# Patient Record
Sex: Female | Born: 1950 | Race: White | Hispanic: No | Marital: Married | Smoking: Never smoker
Health system: Southern US, Community
[De-identification: ages and names within clinical notes are randomized; demographics above are authoritative.]

## PROBLEM LIST (undated history)

## (undated) DIAGNOSIS — G250 Essential tremor: Secondary | ICD-10-CM

## (undated) DIAGNOSIS — D509 Iron deficiency anemia, unspecified: Secondary | ICD-10-CM

## (undated) DIAGNOSIS — N189 Chronic kidney disease, unspecified: Secondary | ICD-10-CM

## (undated) DIAGNOSIS — G252 Other specified forms of tremor: Secondary | ICD-10-CM

## (undated) DIAGNOSIS — F419 Anxiety disorder, unspecified: Secondary | ICD-10-CM

## (undated) DIAGNOSIS — E119 Type 2 diabetes mellitus without complications: Secondary | ICD-10-CM

## (undated) DIAGNOSIS — K219 Gastro-esophageal reflux disease without esophagitis: Secondary | ICD-10-CM

## (undated) DIAGNOSIS — D510 Vitamin B12 deficiency anemia due to intrinsic factor deficiency: Secondary | ICD-10-CM

## (undated) DIAGNOSIS — F32A Depression, unspecified: Secondary | ICD-10-CM

## (undated) DIAGNOSIS — K5730 Diverticulosis of large intestine without perforation or abscess without bleeding: Secondary | ICD-10-CM

## (undated) DIAGNOSIS — F329 Major depressive disorder, single episode, unspecified: Secondary | ICD-10-CM

## (undated) DIAGNOSIS — K589 Irritable bowel syndrome without diarrhea: Secondary | ICD-10-CM

## (undated) DIAGNOSIS — E785 Hyperlipidemia, unspecified: Secondary | ICD-10-CM

## (undated) HISTORY — DX: Type 2 diabetes mellitus without complications: E11.9

## (undated) HISTORY — DX: Iron deficiency anemia, unspecified: D50.9

## (undated) HISTORY — DX: Essential tremor: G25.0

## (undated) HISTORY — DX: Major depressive disorder, single episode, unspecified: F32.9

## (undated) HISTORY — DX: Anxiety disorder, unspecified: F41.9

## (undated) HISTORY — PX: OTHER SURGICAL HISTORY: SHX169

## (undated) HISTORY — DX: Depression, unspecified: F32.A

## (undated) HISTORY — DX: Diverticulosis of large intestine without perforation or abscess without bleeding: K57.30

## (undated) HISTORY — PX: BILATERAL OOPHORECTOMY: SHX1221

## (undated) HISTORY — DX: Vitamin B12 deficiency anemia due to intrinsic factor deficiency: D51.0

## (undated) HISTORY — DX: Irritable bowel syndrome, unspecified: K58.9

## (undated) HISTORY — DX: Morbid (severe) obesity due to excess calories: E66.01

## (undated) HISTORY — DX: Gastro-esophageal reflux disease without esophagitis: K21.9

## (undated) HISTORY — PX: ABDOMINAL HYSTERECTOMY: SHX81

## (undated) HISTORY — DX: Hyperlipidemia, unspecified: E78.5

## (undated) HISTORY — PX: TONSILLECTOMY: SHX5217

## (undated) HISTORY — DX: Chronic kidney disease, unspecified: N18.9

## (undated) HISTORY — DX: Other specified forms of tremor: G25.2

---

## 1999-12-14 ENCOUNTER — Emergency Department (HOSPITAL_COMMUNITY): Admission: EM | Admit: 1999-12-14 | Discharge: 1999-12-14 | Payer: Self-pay | Admitting: Emergency Medicine

## 2003-11-22 ENCOUNTER — Ambulatory Visit: Payer: Self-pay | Admitting: Endocrinology

## 2004-04-24 ENCOUNTER — Ambulatory Visit: Payer: Self-pay | Admitting: Endocrinology

## 2004-12-05 ENCOUNTER — Ambulatory Visit: Payer: Self-pay | Admitting: Endocrinology

## 2004-12-11 ENCOUNTER — Ambulatory Visit: Payer: Self-pay | Admitting: Endocrinology

## 2005-02-07 ENCOUNTER — Ambulatory Visit: Payer: Self-pay | Admitting: Endocrinology

## 2005-12-28 ENCOUNTER — Ambulatory Visit: Payer: Self-pay | Admitting: Family Medicine

## 2005-12-30 ENCOUNTER — Ambulatory Visit: Payer: Self-pay | Admitting: Internal Medicine

## 2005-12-30 ENCOUNTER — Ambulatory Visit: Payer: Self-pay | Admitting: Gastroenterology

## 2006-01-31 ENCOUNTER — Ambulatory Visit: Payer: Self-pay | Admitting: Endocrinology

## 2006-01-31 LAB — CONVERTED CEMR LAB
ALT: 18 units/L (ref 0–40)
AST: 17 units/L (ref 0–37)
Albumin: 3 g/dL — ABNORMAL LOW (ref 3.5–5.2)
BUN: 13 mg/dL (ref 6–23)
Basophils Relative: 0.5 % (ref 0.0–1.0)
Bilirubin Urine: NEGATIVE
Chloride: 103 meq/L (ref 96–112)
Eosinophil percent: 1.5 % (ref 0.0–5.0)
GFR calc non Af Amer: 62 mL/min
HCT: 34.9 % — ABNORMAL LOW (ref 36.0–46.0)
HDL: 42.4 mg/dL (ref >39.0)
Hemoglobin: 11.4 g/dL — ABNORMAL LOW (ref 12.0–15.0)
LDL DIRECT: 49.1 mg/dL
Leukocytes, UA: NEGATIVE
Neutrophils Relative %: 53.2 % (ref 43.0–77.0)
RBC: 4.03 M/uL (ref 3.87–5.11)
RDW: 15 % — ABNORMAL HIGH (ref 11.5–14.6)
Sodium: 143 meq/L (ref 135–145)
Specific Gravity, Urine: >=1.03 (ref 1.000–1.03)
Total Bilirubin: 0.5 mg/dL (ref 0.3–1.2)
Triglyceride fasting, serum: 207 mg/dL (ref 0–149)
Urine Glucose: NEGATIVE mg/dL
WBC: 10.4 10*3/uL (ref 4.5–10.5)

## 2006-02-10 ENCOUNTER — Ambulatory Visit: Payer: Self-pay | Admitting: Endocrinology

## 2006-02-10 LAB — CONVERTED CEMR LAB
HCT: 36.5 % (ref 36.0–46.0)
Hemoglobin: 12.9 g/dL (ref 12.0–15.0)
Hgb A1c MFr Bld: 6.9 % — ABNORMAL HIGH (ref 4.6–6.0)
Saturation Ratios: 14.2 % — ABNORMAL LOW (ref 20.0–50.0)
Transferrin: 310.9 mg/dL (ref >=212.0)
Vitamin B-12: 111 pg/mL — ABNORMAL LOW (ref 211–911)
WBC: 12.9 10*3/uL — ABNORMAL HIGH (ref 4.5–10.5)

## 2006-02-14 ENCOUNTER — Ambulatory Visit: Payer: Self-pay | Admitting: Endocrinology

## 2006-03-17 ENCOUNTER — Ambulatory Visit: Payer: Self-pay | Admitting: Endocrinology

## 2006-04-15 ENCOUNTER — Ambulatory Visit: Payer: Self-pay | Admitting: Endocrinology

## 2006-05-16 LAB — HM COLONOSCOPY

## 2006-05-21 ENCOUNTER — Ambulatory Visit: Payer: Self-pay | Admitting: Endocrinology

## 2006-06-02 ENCOUNTER — Ambulatory Visit: Payer: Self-pay | Admitting: Endocrinology

## 2006-07-07 ENCOUNTER — Ambulatory Visit: Payer: Self-pay | Admitting: Endocrinology

## 2006-08-04 ENCOUNTER — Ambulatory Visit: Payer: Self-pay | Admitting: Endocrinology

## 2006-08-15 ENCOUNTER — Encounter: Payer: Self-pay | Admitting: Endocrinology

## 2006-09-05 ENCOUNTER — Ambulatory Visit: Payer: Self-pay | Admitting: Endocrinology

## 2006-10-14 ENCOUNTER — Ambulatory Visit: Payer: Self-pay | Admitting: Endocrinology

## 2006-11-13 ENCOUNTER — Ambulatory Visit: Payer: Self-pay | Admitting: Endocrinology

## 2006-11-14 ENCOUNTER — Ambulatory Visit: Payer: Self-pay | Admitting: Endocrinology

## 2006-12-22 ENCOUNTER — Ambulatory Visit: Payer: Self-pay | Admitting: Endocrinology

## 2006-12-22 DIAGNOSIS — D510 Vitamin B12 deficiency anemia due to intrinsic factor deficiency: Secondary | ICD-10-CM | POA: Insufficient documentation

## 2007-02-02 ENCOUNTER — Ambulatory Visit: Payer: Self-pay | Admitting: Endocrinology

## 2007-02-02 ENCOUNTER — Telehealth (INDEPENDENT_AMBULATORY_CARE_PROVIDER_SITE_OTHER): Payer: Self-pay | Admitting: *Deleted

## 2007-02-17 ENCOUNTER — Ambulatory Visit: Payer: Self-pay | Admitting: Internal Medicine

## 2007-02-17 DIAGNOSIS — K5730 Diverticulosis of large intestine without perforation or abscess without bleeding: Secondary | ICD-10-CM | POA: Insufficient documentation

## 2007-02-17 DIAGNOSIS — E1165 Type 2 diabetes mellitus with hyperglycemia: Secondary | ICD-10-CM

## 2007-02-17 DIAGNOSIS — D509 Iron deficiency anemia, unspecified: Secondary | ICD-10-CM

## 2007-02-17 DIAGNOSIS — F32A Depression, unspecified: Secondary | ICD-10-CM | POA: Insufficient documentation

## 2007-02-17 DIAGNOSIS — K589 Irritable bowel syndrome without diarrhea: Secondary | ICD-10-CM

## 2007-02-17 DIAGNOSIS — K219 Gastro-esophageal reflux disease without esophagitis: Secondary | ICD-10-CM

## 2007-02-17 DIAGNOSIS — J309 Allergic rhinitis, unspecified: Secondary | ICD-10-CM | POA: Insufficient documentation

## 2007-02-17 DIAGNOSIS — E785 Hyperlipidemia, unspecified: Secondary | ICD-10-CM

## 2007-02-17 DIAGNOSIS — F411 Generalized anxiety disorder: Secondary | ICD-10-CM

## 2007-02-17 DIAGNOSIS — F329 Major depressive disorder, single episode, unspecified: Secondary | ICD-10-CM

## 2007-02-23 ENCOUNTER — Encounter: Payer: Self-pay | Admitting: Endocrinology

## 2007-03-06 ENCOUNTER — Telehealth (INDEPENDENT_AMBULATORY_CARE_PROVIDER_SITE_OTHER): Payer: Self-pay | Admitting: *Deleted

## 2007-03-09 ENCOUNTER — Ambulatory Visit: Payer: Self-pay | Admitting: Endocrinology

## 2007-03-27 ENCOUNTER — Telehealth (INDEPENDENT_AMBULATORY_CARE_PROVIDER_SITE_OTHER): Payer: Self-pay | Admitting: *Deleted

## 2007-04-10 ENCOUNTER — Ambulatory Visit: Payer: Self-pay | Admitting: Endocrinology

## 2007-04-10 DIAGNOSIS — M545 Low back pain: Secondary | ICD-10-CM

## 2007-04-10 LAB — CONVERTED CEMR LAB
Bilirubin Urine: NEGATIVE
Protein, U semiquant: >=300
Specific Gravity, Urine: 1.015
pH: 5

## 2007-04-13 ENCOUNTER — Ambulatory Visit: Payer: Self-pay | Admitting: Endocrinology

## 2007-04-14 ENCOUNTER — Telehealth: Payer: Self-pay | Admitting: Family Medicine

## 2007-04-14 LAB — CONVERTED CEMR LAB
AST: 13 units/L (ref 0–37)
Bilirubin, Direct: 0.1 mg/dL (ref 0.0–0.3)
CO2: 30 meq/L (ref 19–32)
Chloride: 106 meq/L (ref 96–112)
Creatinine, Ser: 1 mg/dL (ref 0.4–1.2)
Eosinophils Relative: 0.9 % (ref 0.0–5.0)
Glucose, Bld: 125 mg/dL — ABNORMAL HIGH (ref 70–99)
HCT: 33.6 % — ABNORMAL LOW (ref 36.0–46.0)
Hgb A1c MFr Bld: 6.4 % — ABNORMAL HIGH (ref 4.6–6.0)
MCV: 85.3 fL (ref 78.0–100.0)
Neutrophils Relative %: 58.1 % (ref 43.0–77.0)
Nitrite: NEGATIVE
RBC: 3.93 M/uL (ref 3.87–5.11)
RDW: 15.5 % — ABNORMAL HIGH (ref 11.5–14.6)
Sodium: 143 meq/L (ref 135–145)
Total Bilirubin: 0.3 mg/dL (ref 0.3–1.2)
Total CHOL/HDL Ratio: 3.4
Urobilinogen, UA: 0.2 (ref 0.0–1.0)
WBC: 10.5 10*3/uL (ref 4.5–10.5)
pH: 6 (ref 5.0–8.0)

## 2007-04-17 ENCOUNTER — Ambulatory Visit: Payer: Self-pay | Admitting: Endocrinology

## 2007-04-17 DIAGNOSIS — Z780 Asymptomatic menopausal state: Secondary | ICD-10-CM | POA: Insufficient documentation

## 2007-04-29 ENCOUNTER — Encounter: Payer: Self-pay | Admitting: Endocrinology

## 2007-04-29 ENCOUNTER — Ambulatory Visit: Payer: Self-pay | Admitting: Internal Medicine

## 2007-05-13 ENCOUNTER — Ambulatory Visit: Payer: Self-pay | Admitting: Endocrinology

## 2007-05-21 ENCOUNTER — Ambulatory Visit: Payer: Self-pay | Admitting: Endocrinology

## 2007-05-21 DIAGNOSIS — I10 Essential (primary) hypertension: Secondary | ICD-10-CM | POA: Insufficient documentation

## 2007-05-21 DIAGNOSIS — M810 Age-related osteoporosis without current pathological fracture: Secondary | ICD-10-CM | POA: Insufficient documentation

## 2007-05-21 LAB — CONVERTED CEMR LAB
Basophils Absolute: 0 10*3/uL
Basophils Relative: 0 %
Eosinophils Absolute: 0.1 10*3/uL
Eosinophils Relative: 0.9 %
HCT: 34.2 % — ABNORMAL LOW
Hemoglobin: 11.2 g/dL — ABNORMAL LOW
Iron: 59 ug/dL
Lymphocytes Relative: 29.8 %
MCHC: 32.6 g/dL
MCV: 85 fL
Monocytes Absolute: 0.4 10*3/uL
Monocytes Relative: 3.9 %
Neutro Abs: 6.4 10*3/uL
Neutrophils Relative %: 65.4 %
Platelets: 399 10*3/uL
RBC: 4.03 M/uL
RDW: 15.1 % — ABNORMAL HIGH
Saturation Ratios: 13.8 % — ABNORMAL LOW
Transferrin: 305.2 mg/dL
WBC: 9.9 10*3/uL

## 2007-05-22 ENCOUNTER — Telehealth (INDEPENDENT_AMBULATORY_CARE_PROVIDER_SITE_OTHER): Payer: Self-pay | Admitting: *Deleted

## 2007-05-22 ENCOUNTER — Encounter: Payer: Self-pay | Admitting: Endocrinology

## 2007-05-26 ENCOUNTER — Telehealth (INDEPENDENT_AMBULATORY_CARE_PROVIDER_SITE_OTHER): Payer: Self-pay | Admitting: *Deleted

## 2007-05-26 LAB — CONVERTED CEMR LAB
Alpha-1-Globulin: 5.6 % — ABNORMAL HIGH (ref 2.9–4.9)
Alpha-2-Globulin: 15.7 % — ABNORMAL HIGH (ref 7.1–11.8)
Beta Globulin: 7.1 % (ref 4.7–7.2)
Calcium, Total (PTH): 9 mg/dL (ref 8.4–10.5)
Gamma Globulin: 11.3 % (ref 11.1–18.8)

## 2007-06-23 ENCOUNTER — Ambulatory Visit: Payer: Self-pay | Admitting: Endocrinology

## 2007-07-27 ENCOUNTER — Ambulatory Visit: Payer: Self-pay | Admitting: Endocrinology

## 2007-08-26 ENCOUNTER — Ambulatory Visit: Payer: Self-pay | Admitting: Endocrinology

## 2007-10-06 ENCOUNTER — Ambulatory Visit: Payer: Self-pay | Admitting: Endocrinology

## 2007-10-09 ENCOUNTER — Ambulatory Visit: Payer: Self-pay | Admitting: Endocrinology

## 2007-11-04 ENCOUNTER — Ambulatory Visit: Payer: Self-pay | Admitting: Endocrinology

## 2007-12-08 ENCOUNTER — Ambulatory Visit: Payer: Self-pay | Admitting: Endocrinology

## 2007-12-14 ENCOUNTER — Ambulatory Visit: Payer: Self-pay | Admitting: Internal Medicine

## 2007-12-14 DIAGNOSIS — R319 Hematuria, unspecified: Secondary | ICD-10-CM

## 2007-12-14 DIAGNOSIS — N390 Urinary tract infection, site not specified: Secondary | ICD-10-CM

## 2007-12-14 LAB — CONVERTED CEMR LAB
Glucose, Urine, Semiquant: NEGATIVE
Nitrite: NEGATIVE
Specific Gravity, Urine: 1.01
pH: 5

## 2008-01-07 ENCOUNTER — Ambulatory Visit: Payer: Self-pay | Admitting: Endocrinology

## 2008-02-19 ENCOUNTER — Ambulatory Visit: Payer: Self-pay | Admitting: Endocrinology

## 2008-04-05 ENCOUNTER — Ambulatory Visit: Payer: Self-pay | Admitting: Endocrinology

## 2008-04-26 ENCOUNTER — Encounter: Payer: Self-pay | Admitting: Endocrinology

## 2008-04-27 ENCOUNTER — Ambulatory Visit: Payer: Self-pay | Admitting: Endocrinology

## 2008-04-27 LAB — CONVERTED CEMR LAB
BUN: 28 mg/dL — ABNORMAL HIGH (ref 6–23)
Calcium: 9.8 mg/dL (ref 8.4–10.5)
Creatinine, Ser: 2.5 mg/dL — ABNORMAL HIGH (ref 0.4–1.2)
Direct LDL: 68.3 mg/dL
Hgb A1c MFr Bld: 6.3 % (ref 4.6–6.5)
TSH: 4.43 microintl units/mL (ref 0.35–5.50)

## 2008-05-09 ENCOUNTER — Ambulatory Visit: Payer: Self-pay | Admitting: Endocrinology

## 2008-05-09 DIAGNOSIS — N259 Disorder resulting from impaired renal tubular function, unspecified: Secondary | ICD-10-CM | POA: Insufficient documentation

## 2008-05-10 ENCOUNTER — Telehealth (INDEPENDENT_AMBULATORY_CARE_PROVIDER_SITE_OTHER): Payer: Self-pay | Admitting: *Deleted

## 2008-05-17 ENCOUNTER — Telehealth (INDEPENDENT_AMBULATORY_CARE_PROVIDER_SITE_OTHER): Payer: Self-pay | Admitting: *Deleted

## 2008-05-24 ENCOUNTER — Ambulatory Visit: Payer: Self-pay | Admitting: Endocrinology

## 2008-05-24 LAB — CONVERTED CEMR LAB
BUN: 19 mg/dL (ref 6–23)
Basophils Relative: 0.3 % (ref 0.0–3.0)
CO2: 28 meq/L (ref 19–32)
Chloride: 106 meq/L (ref 96–112)
Creatinine, Ser: 1.9 mg/dL — ABNORMAL HIGH (ref 0.4–1.2)
Eosinophils Absolute: 0.1 10*3/uL (ref 0.0–0.7)
Eosinophils Relative: 0.6 % (ref 0.0–5.0)
HCT: 27.1 % — ABNORMAL LOW (ref 36.0–46.0)
Lymphs Abs: 2.8 10*3/uL (ref 0.7–4.0)
MCHC: 33.3 g/dL (ref 30.0–36.0)
MCV: 87.2 fL (ref 78.0–100.0)
Monocytes Absolute: 0.9 10*3/uL (ref 0.1–1.0)
Neutrophils Relative %: 64 % (ref 43.0–77.0)
Potassium: 3.8 meq/L (ref 3.5–5.1)
RBC: 3.11 M/uL — ABNORMAL LOW (ref 3.87–5.11)
WBC: 10.6 10*3/uL — ABNORMAL HIGH (ref 4.5–10.5)

## 2008-05-25 ENCOUNTER — Ambulatory Visit (HOSPITAL_COMMUNITY): Admission: RE | Admit: 2008-05-25 | Discharge: 2008-05-25 | Payer: Self-pay | Admitting: Endocrinology

## 2008-05-26 ENCOUNTER — Ambulatory Visit: Payer: Self-pay | Admitting: Cardiovascular Disease

## 2008-05-26 ENCOUNTER — Telehealth: Payer: Self-pay | Admitting: Endocrinology

## 2008-05-26 ENCOUNTER — Ambulatory Visit: Payer: Self-pay | Admitting: Internal Medicine

## 2008-05-26 ENCOUNTER — Inpatient Hospital Stay (HOSPITAL_COMMUNITY): Admission: EM | Admit: 2008-05-26 | Discharge: 2008-06-01 | Payer: Self-pay | Admitting: Emergency Medicine

## 2008-05-27 ENCOUNTER — Encounter (INDEPENDENT_AMBULATORY_CARE_PROVIDER_SITE_OTHER): Payer: Self-pay | Admitting: Internal Medicine

## 2008-06-01 ENCOUNTER — Telehealth: Payer: Self-pay | Admitting: Internal Medicine

## 2008-06-16 ENCOUNTER — Ambulatory Visit: Payer: Self-pay | Admitting: Internal Medicine

## 2008-06-16 LAB — CONVERTED CEMR LAB
BUN: 28 mg/dL — ABNORMAL HIGH (ref 6–23)
CO2: 28 meq/L (ref 19–32)
Calcium: 9.4 mg/dL (ref 8.4–10.5)
Creatinine, Ser: 2 mg/dL — ABNORMAL HIGH (ref 0.4–1.2)
GFR calc non Af Amer: 27.41 mL/min (ref >60)
Glucose, Bld: 157 mg/dL — ABNORMAL HIGH (ref 70–99)

## 2008-06-21 ENCOUNTER — Telehealth: Payer: Self-pay | Admitting: Internal Medicine

## 2008-06-23 ENCOUNTER — Ambulatory Visit: Payer: Self-pay | Admitting: Internal Medicine

## 2008-07-21 ENCOUNTER — Encounter: Payer: Self-pay | Admitting: Internal Medicine

## 2008-07-26 ENCOUNTER — Ambulatory Visit: Payer: Self-pay | Admitting: Internal Medicine

## 2008-08-16 ENCOUNTER — Encounter: Payer: Self-pay | Admitting: Internal Medicine

## 2008-08-24 ENCOUNTER — Encounter (HOSPITAL_COMMUNITY): Admission: RE | Admit: 2008-08-24 | Discharge: 2008-11-02 | Payer: Self-pay | Admitting: Nephrology

## 2008-08-26 ENCOUNTER — Ambulatory Visit: Payer: Self-pay | Admitting: Internal Medicine

## 2008-09-22 ENCOUNTER — Ambulatory Visit: Payer: Self-pay | Admitting: Internal Medicine

## 2008-09-22 LAB — CONVERTED CEMR LAB
ALT: 16 units/L (ref 0–35)
Alkaline Phosphatase: 92 units/L (ref 39–117)
BUN: 31 mg/dL — ABNORMAL HIGH (ref 6–23)
Bilirubin, Direct: <0.1 mg/dL (ref 0.0–0.3)
Calcium: 9.6 mg/dL (ref 8.4–10.5)
Cholesterol: 167 mg/dL (ref 0–200)
Creatinine, Ser: 1.9 mg/dL — ABNORMAL HIGH (ref 0.40–1.20)
Glucose, Bld: 177 mg/dL — ABNORMAL HIGH (ref 70–99)
Hgb A1c MFr Bld: 7.1 % — ABNORMAL HIGH (ref 4.6–6.1)
Potassium: 4.7 meq/L (ref 3.5–5.3)
Triglycerides: 260 mg/dL — ABNORMAL HIGH (ref <150)
VLDL: 52 mg/dL — ABNORMAL HIGH (ref 0–40)

## 2008-09-29 ENCOUNTER — Ambulatory Visit: Payer: Self-pay | Admitting: Internal Medicine

## 2008-09-30 ENCOUNTER — Telehealth: Payer: Self-pay | Admitting: Internal Medicine

## 2008-10-26 ENCOUNTER — Ambulatory Visit: Payer: Self-pay | Admitting: Internal Medicine

## 2008-11-21 ENCOUNTER — Telehealth (INDEPENDENT_AMBULATORY_CARE_PROVIDER_SITE_OTHER): Payer: Self-pay | Admitting: *Deleted

## 2008-11-25 ENCOUNTER — Ambulatory Visit: Payer: Self-pay | Admitting: Internal Medicine

## 2008-11-29 ENCOUNTER — Encounter: Payer: Self-pay | Admitting: Internal Medicine

## 2008-12-12 ENCOUNTER — Encounter (HOSPITAL_COMMUNITY): Admission: RE | Admit: 2008-12-12 | Discharge: 2009-03-12 | Payer: Self-pay | Admitting: Nephrology

## 2008-12-20 ENCOUNTER — Telehealth: Payer: Self-pay | Admitting: Internal Medicine

## 2008-12-22 ENCOUNTER — Ambulatory Visit: Payer: Self-pay | Admitting: Internal Medicine

## 2008-12-22 LAB — CONVERTED CEMR LAB
BUN: 32 mg/dL — ABNORMAL HIGH (ref 6–23)
CO2: 20 meq/L (ref 19–32)
Chloride: 103 meq/L (ref 96–112)
Potassium: 4.1 meq/L (ref 3.5–5.3)

## 2008-12-29 ENCOUNTER — Ambulatory Visit: Payer: Self-pay | Admitting: Internal Medicine

## 2008-12-30 ENCOUNTER — Telehealth: Payer: Self-pay | Admitting: Internal Medicine

## 2009-01-04 ENCOUNTER — Encounter: Payer: Self-pay | Admitting: Internal Medicine

## 2009-01-19 ENCOUNTER — Ambulatory Visit: Payer: Self-pay | Admitting: Internal Medicine

## 2009-01-27 ENCOUNTER — Telehealth: Payer: Self-pay | Admitting: Internal Medicine

## 2009-01-27 ENCOUNTER — Telehealth (INDEPENDENT_AMBULATORY_CARE_PROVIDER_SITE_OTHER): Payer: Self-pay | Admitting: *Deleted

## 2009-02-02 ENCOUNTER — Telehealth: Payer: Self-pay | Admitting: Internal Medicine

## 2009-02-06 ENCOUNTER — Telehealth: Payer: Self-pay | Admitting: Internal Medicine

## 2009-02-23 ENCOUNTER — Ambulatory Visit: Payer: Self-pay | Admitting: Internal Medicine

## 2009-02-23 DIAGNOSIS — R5381 Other malaise: Secondary | ICD-10-CM

## 2009-02-23 DIAGNOSIS — R5383 Other fatigue: Secondary | ICD-10-CM

## 2009-02-23 LAB — CONVERTED CEMR LAB
Chloride: 101 meq/L (ref 96–112)
Ferritin: 75 ng/mL (ref 10–291)
HCT: 37.3 % (ref 36.0–46.0)
Iron: 77 ug/dL (ref 42–145)
Lymphocytes Relative: 29 % (ref 12–46)
Lymphs Abs: 2.7 10*3/uL (ref 0.7–4.0)
Neutrophils Relative %: 64 % (ref 43–77)
Platelets: 356 10*3/uL (ref 150–400)
Potassium: 4.2 meq/L (ref 3.5–5.3)
RBC: 4.21 M/uL (ref 3.87–5.11)
Saturation Ratios: 23 % (ref 20–55)
UIBC: 258 ug/dL
WBC: 9.1 10*3/uL (ref 4.0–10.5)

## 2009-02-24 ENCOUNTER — Encounter: Payer: Self-pay | Admitting: Internal Medicine

## 2009-02-24 ENCOUNTER — Telehealth: Payer: Self-pay | Admitting: Internal Medicine

## 2009-03-14 ENCOUNTER — Telehealth: Payer: Self-pay | Admitting: Internal Medicine

## 2009-03-16 ENCOUNTER — Ambulatory Visit: Payer: Self-pay | Admitting: Internal Medicine

## 2009-03-29 ENCOUNTER — Ambulatory Visit: Payer: Self-pay | Admitting: Internal Medicine

## 2009-04-12 ENCOUNTER — Telehealth: Payer: Self-pay | Admitting: Internal Medicine

## 2009-04-21 ENCOUNTER — Ambulatory Visit: Payer: Self-pay | Admitting: Internal Medicine

## 2009-04-21 LAB — CONVERTED CEMR LAB
Glucose, Urine, Semiquant: NEGATIVE
Nitrite: NEGATIVE
Protein, U semiquant: >=300
Specific Gravity, Urine: >=1.03
Urobilinogen, UA: 0.2
pH: 6

## 2009-05-16 ENCOUNTER — Encounter: Payer: Self-pay | Admitting: Internal Medicine

## 2009-05-16 ENCOUNTER — Ambulatory Visit (HOSPITAL_BASED_OUTPATIENT_CLINIC_OR_DEPARTMENT_OTHER): Admission: RE | Admit: 2009-05-16 | Discharge: 2009-05-16 | Payer: Self-pay | Admitting: Internal Medicine

## 2009-05-20 ENCOUNTER — Ambulatory Visit: Payer: Self-pay | Admitting: Pulmonary Disease

## 2009-05-23 ENCOUNTER — Ambulatory Visit: Payer: Self-pay | Admitting: Internal Medicine

## 2009-05-26 ENCOUNTER — Ambulatory Visit: Payer: Self-pay | Admitting: Internal Medicine

## 2009-05-26 DIAGNOSIS — J069 Acute upper respiratory infection, unspecified: Secondary | ICD-10-CM | POA: Insufficient documentation

## 2009-06-02 ENCOUNTER — Telehealth: Payer: Self-pay | Admitting: Internal Medicine

## 2009-06-05 ENCOUNTER — Encounter: Payer: Self-pay | Admitting: Internal Medicine

## 2009-06-06 ENCOUNTER — Encounter: Payer: Self-pay | Admitting: Internal Medicine

## 2009-06-06 LAB — CONVERTED CEMR LAB
Chloride: 104 meq/L (ref 96–112)
Creatinine, Ser: 1.63 mg/dL — ABNORMAL HIGH (ref 0.40–1.20)
Potassium: 4.3 meq/L (ref 3.5–5.3)

## 2009-06-13 ENCOUNTER — Ambulatory Visit: Payer: Self-pay | Admitting: Internal Medicine

## 2009-06-26 ENCOUNTER — Ambulatory Visit: Payer: Self-pay | Admitting: Internal Medicine

## 2009-06-29 ENCOUNTER — Encounter: Payer: Self-pay | Admitting: Internal Medicine

## 2009-07-27 ENCOUNTER — Ambulatory Visit: Payer: Self-pay | Admitting: Family

## 2009-07-27 DIAGNOSIS — R1011 Right upper quadrant pain: Secondary | ICD-10-CM

## 2009-07-27 LAB — CONVERTED CEMR LAB
Alkaline Phosphatase: 88 units/L (ref 39–117)
Bilirubin, Direct: <0.1 mg/dL (ref 0.0–0.3)
Eosinophils Absolute: 0.1 10*3/uL (ref 0.0–0.7)
Lymphs Abs: 2.3 10*3/uL (ref 0.7–4.0)
MCV: 87.8 fL (ref 78.0–100.0)
Monocytes Relative: 5 % (ref 3–12)
Neutrophils Relative %: 64 % (ref 43–77)
RBC: 4.18 M/uL (ref 3.87–5.11)
Total Bilirubin: 0.3 mg/dL (ref 0.3–1.2)
Total Protein: 6.8 g/dL (ref 6.0–8.3)
WBC: 7.9 10*3/uL (ref 4.0–10.5)

## 2009-07-28 ENCOUNTER — Ambulatory Visit (HOSPITAL_BASED_OUTPATIENT_CLINIC_OR_DEPARTMENT_OTHER)
Admission: RE | Admit: 2009-07-28 | Discharge: 2009-07-28 | Payer: Self-pay | Source: Home / Self Care | Admitting: Internal Medicine

## 2009-07-28 ENCOUNTER — Telehealth: Payer: Self-pay | Admitting: Family

## 2009-07-28 ENCOUNTER — Ambulatory Visit: Payer: Self-pay | Admitting: Diagnostic Radiology

## 2009-08-29 ENCOUNTER — Ambulatory Visit: Payer: Self-pay | Admitting: Internal Medicine

## 2009-10-03 ENCOUNTER — Ambulatory Visit: Payer: Self-pay | Admitting: Internal Medicine

## 2009-10-03 LAB — CONVERTED CEMR LAB
Creatinine, Urine: 117.7 mg/dL
Hgb A1c MFr Bld: 7.5 % — ABNORMAL HIGH (ref <5.7)
Microalb Creat Ratio: 290.2 mg/g — ABNORMAL HIGH (ref 0.0–30.0)
Microalb, Ur: 34.16 mg/dL — ABNORMAL HIGH (ref 0.00–1.89)
Potassium: 4.7 meq/L (ref 3.5–5.3)
Sodium: 140 meq/L (ref 135–145)

## 2009-10-04 ENCOUNTER — Encounter: Payer: Self-pay | Admitting: Internal Medicine

## 2009-11-06 ENCOUNTER — Ambulatory Visit: Payer: Self-pay | Admitting: Internal Medicine

## 2009-12-11 ENCOUNTER — Ambulatory Visit: Payer: Self-pay | Admitting: Internal Medicine

## 2010-01-02 ENCOUNTER — Telehealth: Payer: Self-pay | Admitting: Internal Medicine

## 2010-01-02 ENCOUNTER — Encounter: Payer: Self-pay | Admitting: Internal Medicine

## 2010-01-02 LAB — CONVERTED CEMR LAB
Albumin: 4.3 g/dL (ref 3.5–5.2)
CO2: 24 meq/L (ref 19–32)
Chloride: 102 meq/L (ref 96–112)
HDL: 48 mg/dL (ref >39)
LDL Cholesterol: 104 mg/dL — ABNORMAL HIGH (ref 0–99)
Potassium: 4.8 meq/L (ref 3.5–5.3)
Sodium: 140 meq/L (ref 135–145)
Total Bilirubin: 0.3 mg/dL (ref 0.3–1.2)
Total CHOL/HDL Ratio: 4.2
Total Protein: 7 g/dL (ref 6.0–8.3)
Vitamin B-12: 383 pg/mL (ref 211–911)

## 2010-01-03 ENCOUNTER — Telehealth: Payer: Self-pay | Admitting: Internal Medicine

## 2010-01-05 IMAGING — NM NM PULM PERFUSION & VENT (REBREATHING & WASHOUT)
2 series · 12 of 12 positions shown · non-contrast
Comparison: 05/24/2008

CLINICAL DATA: Shortness of breath, elevated creatinine

NUCLEAR MEDICINE VENTILATION AND PERFUSION SCAN
TECHNIQUE: Wash-in, equilibrium, and wash-out phase ventilation
images were obtained using Ee-OSS gas.  Perfusion images were
obtained in multiple projections after intravenous injection of Tc-
99m MAA.
Radiopharmaceutical: 10 mCi xenon-688 for ventilation and 6 mCi
technetium MAA for perfusion

[vq scan · 2.52mm/px · 6 of 20 frames shown (1 of 2)]
[frame 2/20  full-range]
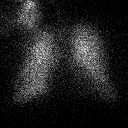
[frame 5/20]
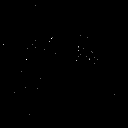
[frame 9/20]
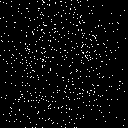
[frame 12/20]
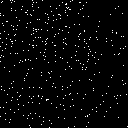
[frame 15/20]
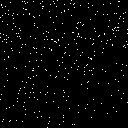
[frame 19/20]
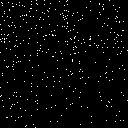

[vq scan · 2.52mm/px · 6 of 20 frames shown (2 of 2)]
[frame 2/20  full-range]
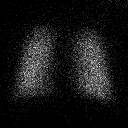
[frame 5/20]
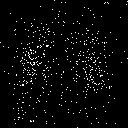
[frame 9/20]
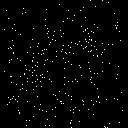
[frame 12/20]
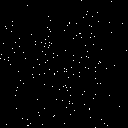
[frame 15/20]
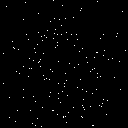
[frame 19/20]
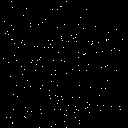

[12 of 12 positions shown; findings below may reference images not displayed]

FINDINGS: Perfusion scan demonstrates no wedge-shaped pleural-based
segmental perfusion defects.  Symmetric lung perfusion.  Artifact
across the upper lobes likely related to overlying arms and
shoulders.  Ventilation study demonstrates symmetric ventilation.
No ventilatory defects.  Normal washout.
IMPRESSION: Low probability for pulmonary embolus.

## 2010-01-07 IMAGING — US US RENAL
1 series · 14 of 24 positions shown · non-contrast
Comparison: Abdominal ultrasound 12/30/2005.

CLINICAL DATA: Renal failure

RENAL/URINARY TRACT ULTRASOUND COMPLETE

[Series 1: us renal · 0.31mm/px · 14 of 24 slices shown]
[im 1/24]
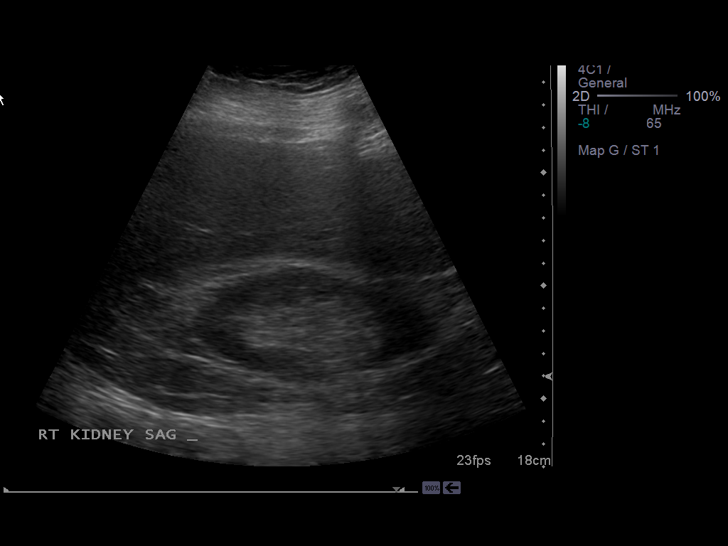
[im 3/24]
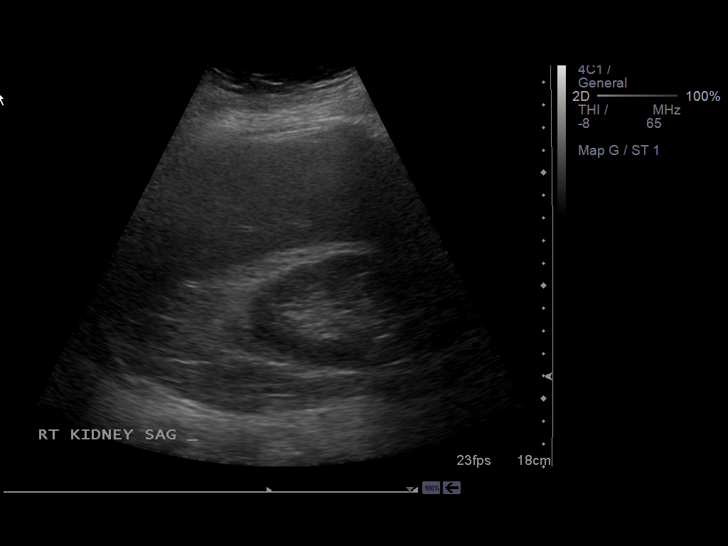
[im 5/24]
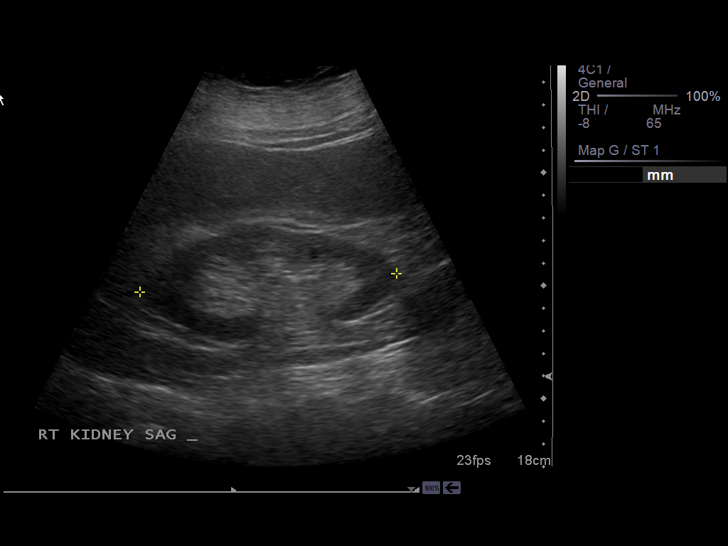
[im 7/24]
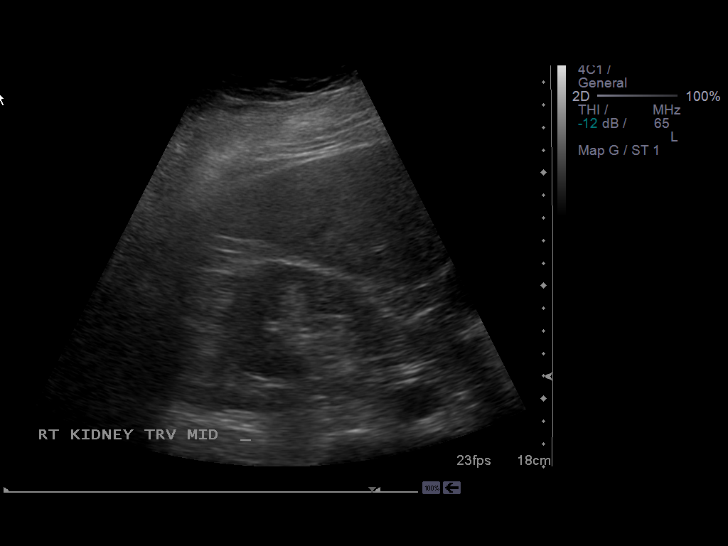
[im 8/24]
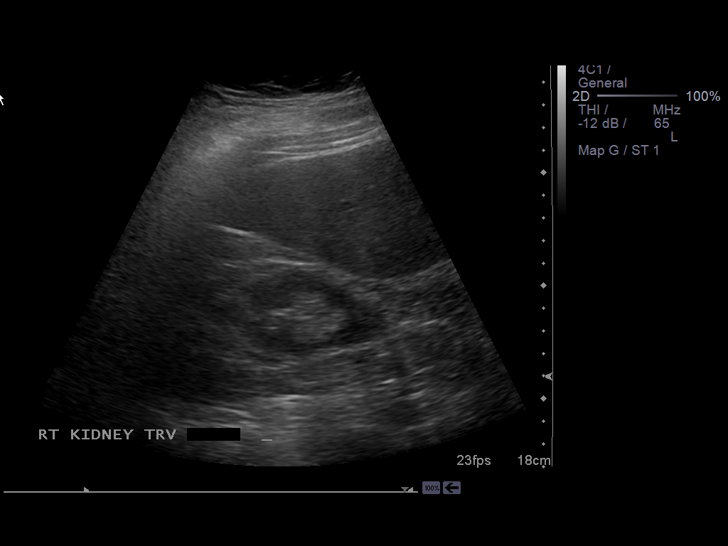
[im 10/24]
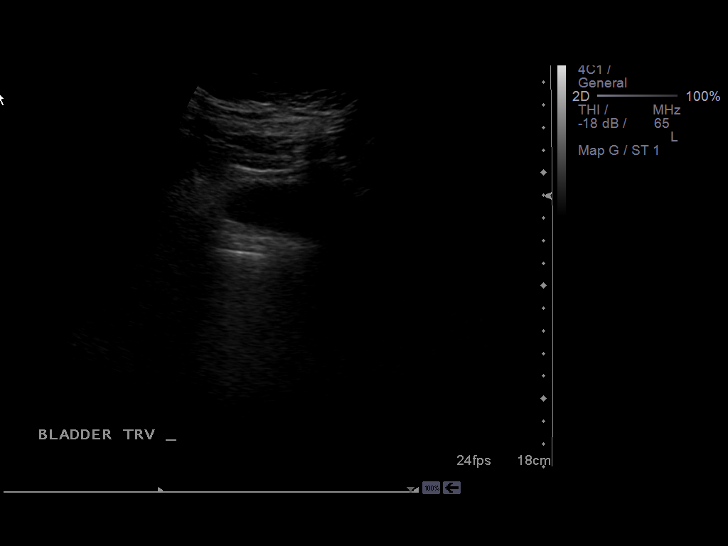
[im 12/24]
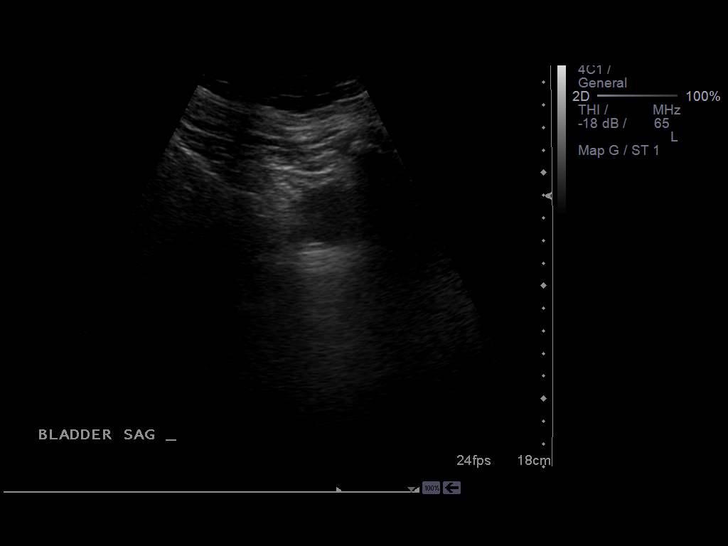
[im 13/24]
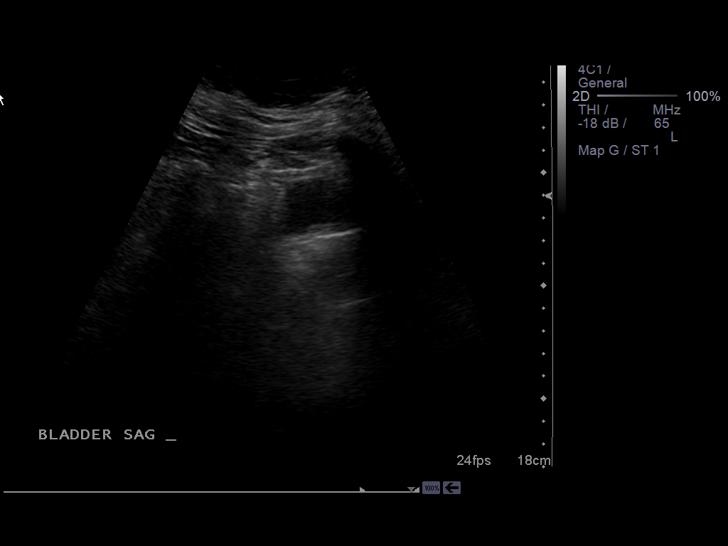
[im 15/24]
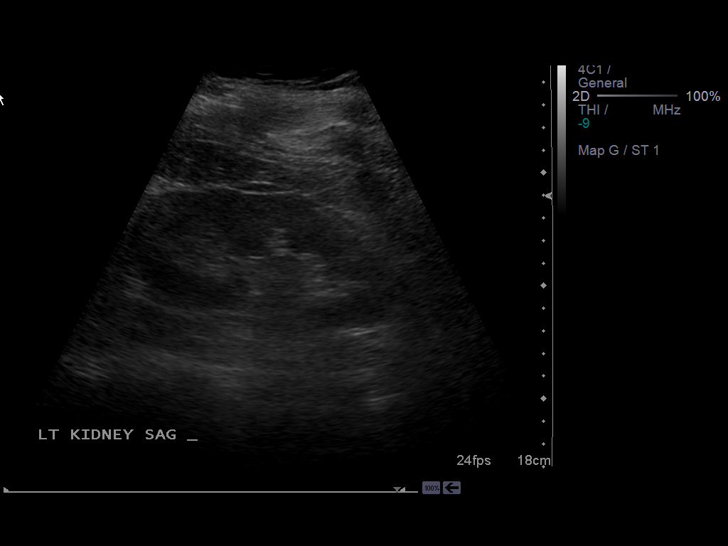
[im 17/24]
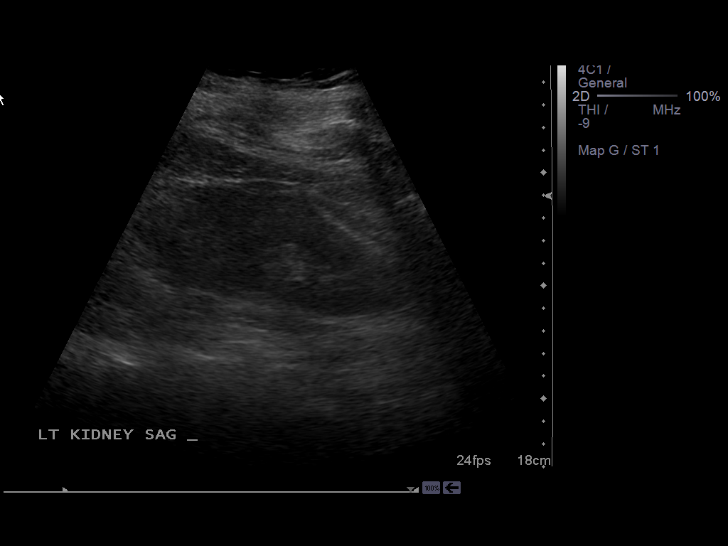
[im 19/24]
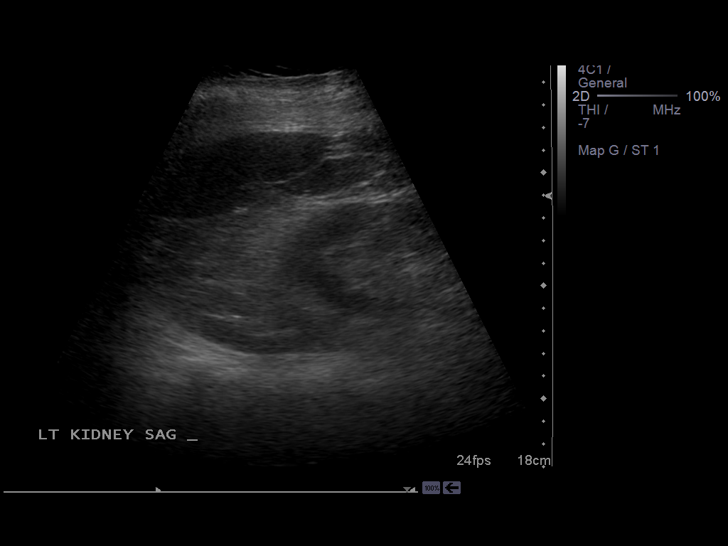
[im 20/24]
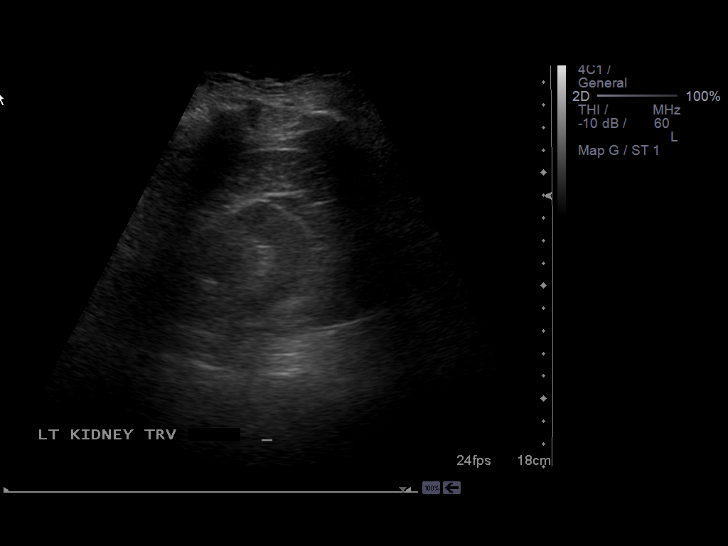
[im 22/24]
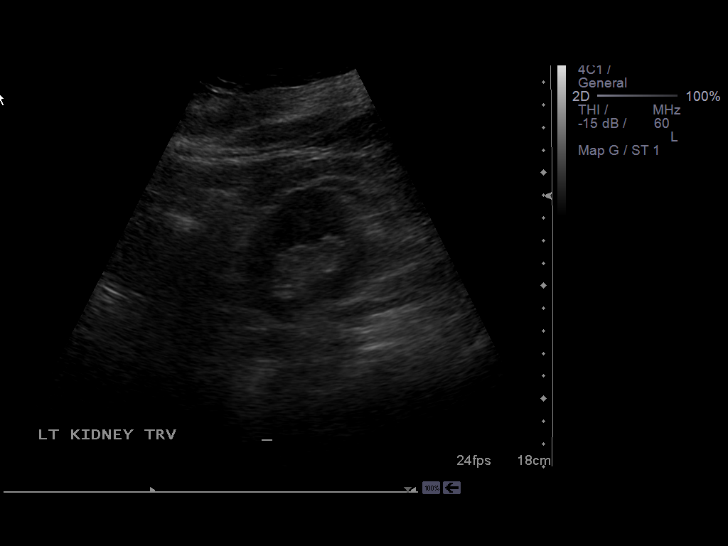
[im 24/24]
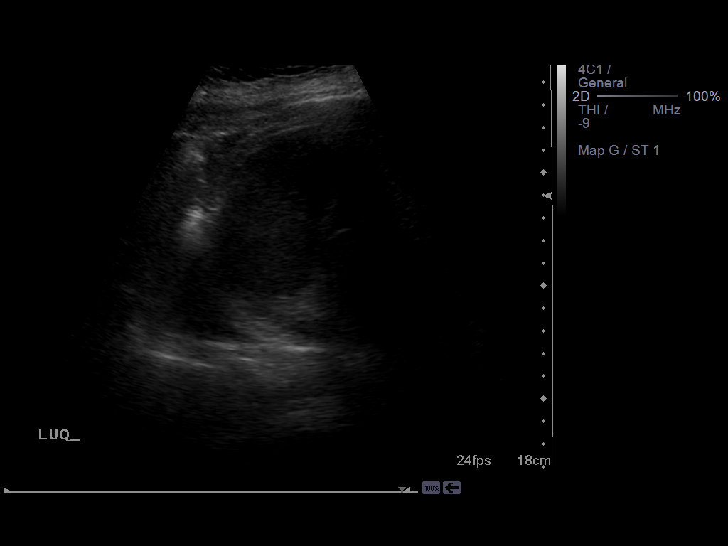

[14 of 24 positions shown; findings below may reference images not displayed]

FINDINGS: Right Kidney:  Normal in size and appearance without
hydronephrosis.  Renal length 11.4 cm.

Left Kidney:  Normal in size and appearance without hydronephrosis.
Renal length 12.0 cm.

Bladder:  Unremarkable for degree of distension.

Incidentally noted is bilateral pleural effusions.
IMPRESSION: Normal renal ultrasound.

## 2010-01-11 ENCOUNTER — Ambulatory Visit: Payer: Self-pay | Admitting: Internal Medicine

## 2010-01-23 ENCOUNTER — Ambulatory Visit
Admission: RE | Admit: 2010-01-23 | Discharge: 2010-01-23 | Payer: Self-pay | Source: Home / Self Care | Attending: Family | Admitting: Family

## 2010-02-12 ENCOUNTER — Encounter: Payer: Self-pay | Admitting: Endocrinology

## 2010-02-13 ENCOUNTER — Ambulatory Visit: Admit: 2010-02-13 | Payer: Self-pay | Admitting: Internal Medicine

## 2010-02-13 ENCOUNTER — Ambulatory Visit
Admission: RE | Admit: 2010-02-13 | Discharge: 2010-02-13 | Payer: Self-pay | Source: Home / Self Care | Attending: Internal Medicine | Admitting: Internal Medicine

## 2010-02-13 DIAGNOSIS — M79609 Pain in unspecified limb: Secondary | ICD-10-CM | POA: Insufficient documentation

## 2010-02-18 LAB — CONVERTED CEMR LAB
Basophils Absolute: 0 10*3/uL (ref 0.0–0.1)
Basophils Relative: 0.2 % (ref 0.0–1.0)
CO2: 29 meq/L (ref 19–32)
Calcium: 10 mg/dL (ref 8.4–10.5)
Eosinophils Absolute: 0.1 10*3/uL (ref 0.0–0.7)
GFR calc non Af Amer: 27.42 mL/min (ref >60)
Glucose, Bld: 63 mg/dL — ABNORMAL LOW (ref 70–99)
MCHC: 33.4 g/dL (ref 30.0–36.0)
MCV: 83.7 fL (ref 78.0–100.0)
Monocytes Absolute: 0.6 10*3/uL (ref 0.1–1.0)
Neutrophils Relative %: 69.9 % (ref 43.0–77.0)
Platelets: 454 10*3/uL — ABNORMAL HIGH (ref 150–400)
Potassium: 4.4 meq/L (ref 3.5–5.1)
Saturation Ratios: 13.4 % — ABNORMAL LOW (ref 20.0–50.0)
Sodium: 141 meq/L (ref 135–145)
Transferrin: 281.6 mg/dL (ref >=212.0)

## 2010-02-20 ENCOUNTER — Ambulatory Visit
Admission: RE | Admit: 2010-02-20 | Discharge: 2010-02-20 | Payer: Self-pay | Source: Home / Self Care | Attending: Internal Medicine | Admitting: Internal Medicine

## 2010-02-20 NOTE — Assessment & Plan Note (Signed)
Summary: 3 month follow up/mjhf   Vital Signs:  Patient profile:   60 year old female Height:      64 inches Weight:      251.25 pounds BMI:     43.28 O2 Sat:      97 % on Room air Temp:     98.3 degrees F oral Pulse rate:   82 / minute Pulse rhythm:   regular Resp:     20 per minute BP sitting:   146 / 80  (left arm) Cuff size:   large  Vitals Entered By: Jiles Garter CMA (Jun 13, 2009 10:54 AM)  O2 Flow:  Room air CC: Rm 2- 3 Month Follow up disease management, Type 2 diabetes mellitus follow-up Is Patient Diabetic? Yes Did you bring your meter with you today? No Comments low blood sugar 110 high  246 avg varies , elevation is usually during the morning hours   Primary Care Provider:  DDrema Pry DO  CC:  Rm 2- 3 Month Follow up disease management and Type 2 diabetes mellitus follow-up.  History of Present Illness:  Type 2 Diabetes Mellitus Follow-Up      This is a 60 year old woman who presents for Type 2 diabetes mellitus follow-up.  The patient denies self managed hypoglycemia, hypoglycemia requiring help, and weight gain.  Since the last visit the patient reports good dietary compliance, compliance with medications, exercising regularly, and monitoring blood glucose.  still having elevated AM blood sugars.  she does not have sleep apnea she has evening snack with lipitor  her family is excited about first grandchild on the way  Allergies: 1)  ! Codeine 2)  ! Demerol 3)  ! 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   Lungs:  normal respiratory effort and normal breath sounds.   Heart:  normal rate, regular rhythm, and no gallop.   Extremities:  No lower extremity edema   Impression & Recommendations:  Problem # 1:  DIABETES MELLITUS, TYPE II (ICD-250.00) Assessment Improved  A1c is improved but suboptimal.  no hypoglycema.  No sleep apnea found.   She is having evening snack which I suspect is raising her AM blood sugar. Pt advised to eliminate evening snack.  I would like to avoid using higher dose of insulin and assoc wt gain.  regular exercise encouraged  Her updated medication list for this problem includes:    Lantus Solostar 100 Unit/ml Soln (Insulin glargine) .Marland KitchenMarland KitchenMarland KitchenMarland Kitchen 50 units q pm    Apidra Solostar 100 Unit/ml Soln (Insulin glulisine) .Marland KitchenMarland KitchenMarland KitchenMarland Kitchen 10-20 units subcutaneously before breakfast and dinner    Januvia 50 Mg Tabs (Sitagliptin phosphate) ..... One by mouth qd  Labs Reviewed: Creat: 1.63 (06/06/2009)     Last Eye Exam: normal (07/21/2008) Reviewed HgBA1c results: 8.2 (06/06/2009)  9.1 (12/22/2008)  Orders: Prescription Created Electronically [phone])  Problem # 2:  RENAL INSUFFICIENCY (ICD-588.9) Assessment: Improved improved.  continue BP control.  Consider change labetalol to Bystolic  Complete Medication List: 1)  Paroxetine Hcl 30 Mg Tabs (Paroxetine hcl) .... One by mouth two times a day 2)  Alprazolam 1 Mg Tabs (Alprazolam) .... Take 1 tablet by mouth two times a day 3)  Lipitor 40 Mg Tabs (Atorvastatin calcium) .Marland Kitchen.. 1 by mouth once daily 4)  Vitamin B-12 Cr 1000 Mcg Tbcr (Cyanocobalamin) .... Inject q month 5)  Labetalol Hcl 200 Mg Tabs (Labetalol hcl) .... One by mouth two times a day 6)  Mysoline 250 Mg Tabs (Primidone) .Marland Kitchen.. 1 tablet by mouth  every morning and three tablets by mouth at bedtime 7)  Amlodipine Besylate 5 Mg Tabs (Amlodipine besylate) .... One by mouth once daily 8)  Mirtazapine 15  Mg Tabs (Mirtazapine) .... 1/2 at bedtime 9)  Calcium 600/vitamin D 600-400 Mg-unit Tabs (Calcium carbonate-vitamin d) .... Take 1 tablet by mouth once daily 10)  Premarin 0.9 Mg Tabs (Estrogens conjugated) .... Take 1 tablet by mouth every morning 11)  Fish Oil 1000 Mg Caps (Omega-3 fatty acids) .... 4 tablets by mouth  every morning 12)  Furosemide 20 Mg Tabs (Furosemide) .... Take 1 tablet by mouth two times a day 13)  Ferrous Sulfate 325 (65 Fe) Mg Tabs (Ferrous sulfate) .... Take 1 tablet by mouth once a day 14)  Lantus Solostar 100 Unit/ml Soln (Insulin glargine) .... 50 units q pm 15)  Apidra Solostar 100 Unit/ml Soln (Insulin glulisine) .Marland Kitchen.. 10-20 units subcutaneously before breakfast and dinner 16)  Freestyle Lite Test Strp (Glucose blood) .... Use six times a day 17)  Freestyle Lancets Misc (Lancets) .... Use six times a day 18)  Januvia 50 Mg Tabs (Sitagliptin phosphate) .... One by mouth qd 19)  Bd Pen Needle Ultrafine 29g X 12.27mm Misc (Insulin pen needle) .... Use three times a day as directed  Patient Instructions: 1)  Please schedule a follow-up appointment in 3 months. 2)  BMP prior to visit, ICD-9:  401.9 3)  HbgA1C prior to visit, ICD-9: 250.02 4)  Urine Microalbumin prior to visit, ICD-9: 250.02 5)  Please return for lab work one (1) week before your next appointment.  Prescriptions: LANTUS SOLOSTAR 100 UNIT/ML SOLN (INSULIN GLARGINE) 50 units q pm  #1 month x 5   Entered and Authorized by:   D. Drema Pry DO   Signed by:   D. Drema Pry DO on 06/13/2009   Method used:   Electronically to        [facility] (retail)       [address]       [place],   [address]       Ph: [phone]       Fax: [phone]   RxID:   [id number]   Current Allergies (reviewed today): ! CODEINE ! DEMEROL ! * ADVICOR

## 2010-02-20 NOTE — Assessment & Plan Note (Signed)
Summary: 1 MONTH FOLLOW UP/MHF B 12 INJ   Vital Signs:  Patient profile:   60 year old female Weight:      235.25 pounds BMI:     40.53 O2 Sat:      95 % on Room air Temp:     98.5 degrees F oral Pulse rate:   75 / minute Pulse rhythm:   regular Resp:     18 per minute BP sitting:   120 / 80  (left arm) Cuff size:   large  Vitals Entered By: Jiles Garter CMA (February 23, 2009 10:30 AM)  O2 Flow:  Room air  Primary Care Provider:  D. Drema Pry DO  CC:  1 Month Follow up and Type 2 diabetes mellitus follow-up.  History of Present Illness: 1 Month Follow up disease management  Type 2 Diabetes Mellitus Follow-Up      This is a 60 year old woman who presents for Type 2 diabetes mellitus follow-up.  The patient denies self managed hypoglycemia and hypoglycemia requiring help.  The patient denies the following symptoms: chest pain.  Since the last visit the patient reports good dietary compliance, compliance with medications, and monitoring blood glucose.  low blood sugar 191 high 285  avg 200's elevation is random throughout the day  pt c/o chronic fatigue.  no chest pain.  no sob.  Allergies: 1)  ! Codeine 2)  ! Demerol 3)  ! 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  The patient denies chest pain.    Physical Exam  General:  alert and overweight-appearing.   Head:  normocephalic and  atraumatic.   Neck:  supple and no masses.  no carotid bruits.   Lungs:  normal respiratory effort and normal breath sounds.   Heart:  normal rate, regular rhythm, and no gallop.   Extremities:  trace left pedal edema and trace right pedal edema.   Neurologic:  cranial nerves II-XII intact and gait normal.   Psych:  normally interactive, good eye contact, not anxious appearing, and not depressed appearing.     Impression & Recommendations:  Problem # 1:  DIABETES MELLITUS, TYPE II (ICD-250.00) still working on insulin titration.   reduce carb intake to 30 grams per meal.  use longer pen needle.  Her updated medication list for this problem includes:    Lantus Solostar 100 Unit/ml Soln (Insulin glargine) .Marland Kitchen... 20 - 35 units q pm    Apidra Solostar 100 Unit/ml Soln (Insulin glulisine) .Marland KitchenMarland KitchenMarland KitchenMarland Kitchen 10-20 units subcutaneously before breakfast and dinner    Januvia 50 Mg Tabs (Sitagliptin phosphate) ..... One by mouth qd  Labs Reviewed: Creat: 1.65 (12/22/2008)     Last Eye Exam: normal (07/21/2008) Reviewed HgBA1c results: 9.1 (12/22/2008)  7.1 (09/22/2008)  Problem # 2:  FATIGUE (ICD-780.79) chronic fatigue.  she has not had sign anemia assoc with her renal insuff.  pt high risk for OSA.  arrange sleep study.   Orders: T-Basic Metabolic Panel ([id number]) T-CBC w/Diff [phone]) T-Iron [phone]) T-Iron Binding Capacity (TIBC) ([id number]) T-Ferritin ([phone]) T-Vitamin B12 [id number]) Sleep Disorder Referral (Sleep Disorder)  Problem # 3:  HYPERTENSION (ICD-401.9) controlled.  Maintain current medication regimen.  Her updated medication list for this problem includes:    Labetalol Hcl 200 Mg Tabs (Labetalol hcl) ..... One by mouth bid    Amlodipine Besylate 5 Mg Tabs (Amlodipine besylate) ..... One by mouth once daily    Furosemide 20 Mg Tabs (Furosemide) .Marland Kitchen... Take 1 tablet by mouth two times a day  BP today: 120/80 Prior BP: 110/80 (01/19/2009)  Labs  Reviewed: K+: 4.1 (12/22/2008) Creat: : 1.65 (12/22/2008)   Chol: 167 (09/22/2008)   HDL: 43 (09/22/2008)   LDL: 72 (09/22/2008)   TG: 260 (09/22/2008)  Complete Medication List: 1)  Paroxetine Hcl 30 Mg Tabs (Paroxetine hcl) .... One by mouth bid 2)  Alprazolam 1 Mg Tabs (Alprazolam) .... Take 1 tablet by mouth two times a day 3)  Lipitor 40 Mg Tabs (Atorvastatin calcium) .Marland Kitchen.. 1 by mouth once daily 4)  Vitamin B-12 Cr 1000 Mcg Tbcr (Cyanocobalamin) .... Inject q month 5)  Labetalol Hcl 200 Mg Tabs (Labetalol hcl) .... One by mouth bid 6)  Mysoline 250 Mg Tabs (Primidone) .Marland Kitchen.. 1 tablet by mouth  every morning and three tablets by mouth at bedtime 7)  Amlodipine Besylate 5 Mg Tabs (Amlodipine besylate) .... One by mouth once daily 8)  Mirtazapine 15 Mg Tabs (Mirtazapine) .... 1/2 at bedtime 9)  Calcium 600/vitamin D 600-400 Mg-unit Tabs (Calcium carbonate-vitamin d) .... Take 1 tablet by mouth once daily 10)  Premarin 0.9 Mg Tabs (Estrogens conjugated) .... Take 1 tablet by mouth every morning 11)  Fish Oil 1000 Mg Caps (Omega-3 fatty acids) .... 4 tablets by mouth  every morning 12)  Furosemide 20 Mg Tabs (Furosemide) .... Take 1 tablet by mouth two times a day 13)  Ferrous Sulfate 325 (65 Fe) Mg Tabs (Ferrous sulfate) .... Take 1 tablet by mouth once a day 14)  Lantus Solostar 100 Unit/ml Soln (Insulin glargine) .... 20 - 35 units q pm 15)  Apidra Solostar 100 Unit/ml Soln (Insulin glulisine) .Marland Kitchen.. 10-20 units subcutaneously before breakfast and dinner 16)  Freestyle Lite Test Strp (Glucose blood) .... Use six times a day 17)  Freestyle Lancets Misc (Lancets) .... Use six times a day 18)  Januvia 50 Mg Tabs (Sitagliptin phosphate) .... One by mouth qd 19)  Bd Pen Needle Ultrafine 29g X 12.61mm Misc (Insulin pen needle) .... Use three times a day as directed  Other Orders: Vit B12 1000 mcg (J3420) Admin of Therapeutic Inj  intramuscular or subcutaneous [id number])  Patient Instructions: 1)   Please schedule a follow-up appointment in 3 weeks. 2)  Use longer ped needles first before adjusting insulin dose 3)  Limit yourself to 30 grams of carbs per meal Prescriptions: JANUVIA 50 MG TABS (SITAGLIPTIN PHOSPHATE) one by mouth qd  #90 x 1   Entered and Authorized by:   D. Drema Pry DO   Signed by:   D. Drema Pry DO on 02/23/2009   Method used:   Electronically to        [facility] (mail-order)             ,          Ph: [phone]       Fax: [phone]   RxID:   [id number] BD PEN NEEDLE ULTRAFINE  29G X 12.7MM MISC (INSULIN PEN NEEDLE) use three times a day as directed  #100 x 3   Entered and Authorized by:   D. Drema Pry DO   Signed by:   D. Drema Pry DO on 02/23/2009   Method used:   Electronically to        [facility] (mail-order)             ,          Ph: [phone]       Fax: [phone]   RxID:   [id number] APIDRA SOLOSTAR 100 UNIT/ML SOLN (INSULIN GLULISINE) 10-20 units Subcutaneously before breakfast and dinner  #3 month x 1   Entered and Authorized by:   D. Drema Pry DO   Signed by:   D. Drema Pry DO on 02/23/2009   Method used:   Electronically to        [facility] (mail-order)             ,          Ph: [phone]       Fax: [phone]   RxID:   [id number] LANTUS SOLOSTAR 100 UNIT/ML SOLN (INSULIN GLARGINE) 20 - 35 units q pm  #3 month x 1   Entered and Authorized by:   D. Drema Pry DO   Signed by:   D. Drema Pry DO on 02/23/2009   Method used:   Electronically to        [facility] (mail-order)             ,          Ph: [phone]       Fax: [phone]   [id number] APIDRA SOLOSTAR 100 UNIT/ML SOLN (INSULIN GLULISINE) 10-20 units Subcutaneously before breakfast and dinner  #1 month x 1   Entered and Authorized by:   D. Drema Pry DO   Signed by:   D. Drema Pry DO on 02/23/2009   Method used:   Electronically to        [facility] [id number]* (retail)       [place], [place]  [address]       Ph: [phone] or [phone]       Fax: [phone]   RxID:   [id number] LANTUS SOLOSTAR 100 UNIT/ML SOLN (INSULIN GLARGINE) 20 - 35 units q pm  #1 month x 1   Entered and Authorized by:   D. Drema Pry DO   Signed by:   D. Drema Pry DO on 02/23/2009   Method used:   Electronically to        [facility] [phone]* (retail)       [place], [place]  [address]       Ph: [phone] or [phone]       Fax: [phone]   RxID:   [id number] BD PEN NEEDLE ULTRAFINE 29G X 12.7MM MISC (INSULIN PEN NEEDLE) use three times a day as directed  #100 x 3   Entered and Authorized by:   D. Drema Pry DO   Signed by:   D. Drema Pry DO on 02/23/2009   Method used:   Electronically to        [facility] [id number]* (retail)  [address] [place], [place]  [address]       Ph: [phone] or [phone]       Fax: [phone]   RxID:   [id number]    Medication Administration  Injection # 1:    Medication: Vit B12 1000 mcg    Diagnosis: ANEMIA, PERNICIOUS (ICD-281.0)    Route: IM    Site: L deltoid    Exp Date: 12/21/2010    Lot #: [id number]    Mfr: [employer] [place]    Patient tolerated injection without complications    Given by: Jiles Garter CMA (February 23, 2009 10:32 AM)  Orders Added: 1)  Vit B12 1000 mcg [J3420] 2)  Admin of Therapeutic Inj  intramuscular or subcutaneous [96372] 3)  T-Basic Metabolic Panel 0000000 4)  T-CBC w/Diff [id number] 5)  T-Iron [id number] 6)  T-Iron Binding Capacity (TIBC) [id number] 7)  T-Ferritin [id number] 8)  T-Vitamin B12 [[id number]] 9)  Sleep Disorder Referral [Sleep Disorder] 10)  Est. Patient Level IV [id number]

## 2010-02-20 NOTE — Assessment & Plan Note (Signed)
Summary: UTI?MHF   Vital Signs:  Patient profile:   60 year old female O2 Sat:      97 % Temp:     98.0 degrees F oral Pulse rate:   75 / minute Pulse rhythm:   regular Resp:     18 per minute BP sitting:   118 / 78  (right arm) Cuff size:   large  Vitals Entered By: Jiles Garter CMA (April 21, 2009 11:00 AM) CC: Rm 3- follow up disease management   Primary Care Provider:  DDrema Pry DO  CC:  Rm 3- follow up disease management.  History of Present Illness: 60 y/o white female c/o bladder pressure, pain across lower back  symptoms started Monday night no fever or chills  DMII -  low blood sugar 117 high 272  fasting blood sugars usually around 200 c/o chronic fatigue.   she never went for sleep study as directed  Allergies: 1)  ! Codeine 2)  ! Demerol 3)  ! 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   Lungs:  normal respiratory effort and normal breath sounds.   Heart:  normal rate, regular rhythm, and no gallop.   Abdomen:  mild suprapubic tenderness, soft, no guarding, and no rigidity.   Extremities:  No lower extremity edema   Impression & Recommendations:  Problem # 1:  UTI (ICD-599.0) pt with cystitis symptoms.  tx with ceftin  Her updated medication list for this problem includes:    Cefuroxime Axetil 500 Mg Tabs (Cefuroxime axetil) ..... One by mouth two times a day  Orders: UA Dipstick w/o Micro (manual) (81002)  Problem # 2:  FATIGUE (ICD-780.79) awaiting sleep study.  we discussed potential of OSA exacerbating DM control  Complete Medication List: 1)  Paroxetine Hcl 30 Mg Tabs (Paroxetine hcl) .... One by mouth bid 2)  Alprazolam 1 Mg  Tabs (Alprazolam) .... Take 1 tablet by mouth two times a day 3)  Lipitor 40 Mg Tabs (Atorvastatin calcium) .Marland Kitchen.. 1 by mouth once daily 4)  Vitamin B-12 Cr 1000 Mcg Tbcr (Cyanocobalamin) .... Inject q month 5)  Labetalol Hcl 200 Mg Tabs (Labetalol hcl) .... One by mouth bid 6)  Mysoline 250 Mg Tabs (Primidone) .Marland Kitchen.. 1 tablet by mouth  every morning and three tablets by mouth at bedtime 7)  Amlodipine Besylate 5 Mg Tabs (Amlodipine besylate) .... One by mouth once daily 8)  Mirtazapine 15 Mg Tabs (Mirtazapine) .... 1/2 at bedtime 9)  Calcium 600/vitamin D 600-400 Mg-unit Tabs (Calcium carbonate-vitamin d) .... Take 1 tablet by mouth once daily 10)  Premarin 0.9 Mg Tabs (Estrogens conjugated) .... Take 1 tablet by mouth every morning 11)  Fish Oil 1000 Mg Caps (Omega-3 fatty acids) .... 4 tablets by mouth  every morning 12)  Furosemide 20 Mg Tabs (Furosemide) .... Take 1 tablet by mouth two times a day 13)  Ferrous Sulfate 325 (65 Fe) Mg Tabs (Ferrous sulfate) .... Take 1 tablet by mouth once a day 14)  Lantus Solostar 100 Unit/ml Soln (Insulin glargine) .... 35- 50  units q pm 15)  Apidra Solostar 100 Unit/ml Soln (Insulin  glulisine) .Marland Kitchen.. 10-20 units subcutaneously before breakfast and dinner 16)  Freestyle Lite Test Strp (Glucose blood) .... Use six times a day 17)  Freestyle Lancets Misc (Lancets) .... Use six times a day 18)  Januvia 50 Mg Tabs (Sitagliptin phosphate) .... One by mouth qd 19)  Bd Pen Needle Ultrafine 29g X 12.30mm Misc (Insulin pen needle) .... Use three times a day as directed 20)  Cefuroxime Axetil 500 Mg Tabs (Cefuroxime axetil) .... One by mouth two times a day  Other Orders: Vit B12 1000 mcg (J3420) Admin of Therapeutic Inj  intramuscular or subcutaneous [id number])  Patient Instructions: 1)  Keep your next follow up appointment 2)  Call our office if your symptoms do not  improve or gets worse. Prescriptions: BD PEN NEEDLE ULTRAFINE 29G X 12.7MM MISC (INSULIN PEN NEEDLE)  use three times a day as directed  #300 x 3   Entered and Authorized by:   D. Drema Pry DO   Signed by:   D. Drema Pry DO on 04/21/2009   Method used:   Electronically to        [facility] (mail-order)             ,          Ph: [phone]       Fax: [phone]   RxID:   [id number] LANTUS SOLOSTAR 100 UNIT/ML SOLN (INSULIN GLARGINE) 35- 50  units q pm  #3 month x 1   Entered and Authorized by:   D. Drema Pry DO   Signed by:   D. Drema Pry DO on 04/21/2009   Method used:   Electronically to        [facility] (mail-order)             ,          Ph: [phone]       Fax: [phone]   [id number] CEFUROXIME AXETIL 500 MG TABS (CEFUROXIME AXETIL) one by mouth two times a day  #14 x 0   Entered and Authorized by:   D. Drema Pry DO   Signed by:   D. Drema Pry DO on 04/21/2009   Method used:   Electronically to        [facility].* (retail)       [address]       [place], [place]  [address]       Ph: [phone]       Fax: [phone]   RxID:   [id number]   Current Allergies (reviewed today): ! CODEINE ! DEMEROL ! * ADVICOR     Medication Administration  Injection # 1:    Medication: Vit B12 1000 mcg    Diagnosis: ANEMIA-IRON DEFICIENCY (ICD-280.9)    Route: IM    Site: R deltoid    Exp Date: 12/21/2010    Lot #: [id number]    Mfr: [place]    Patient tolerated injection without complications    Given by: Jiles Garter CMA (April 21, 2009 11:39 AM)  Orders Added: 1)  Vit B12 1000 mcg [J3420] 2)  Admin of Therapeutic Inj  intramuscular or subcutaneous [96372] 3)  UA Dipstick w/o Micro (manual) [81002] 4)  Est. Patient Level III [id number]   Laboratory Results   Urine Tests    Routine Urinalysis   Color: yellow Appearance: Clear Glucose: negative   (Normal Range: Negative) Bilirubin: negative   (  Normal Range: Negative) Ketone: negative   (Normal Range: Negative) Spec. Gravity: >=1.030    (Normal Range: 1.003-1.035) Blood: trace-intact   (Normal Range: Negative) pH: 6.0   (Normal Range: 5.0-8.0) Protein: >=300   (Normal Range: Negative) Urobilinogen: 0.2   (Normal Range: 0-1) Nitrite: negative   (Normal Range: Negative) Leukocyte Esterace: trace   (Normal Range: Negative)

## 2010-02-20 NOTE — Progress Notes (Signed)
Summary: Elevated Blood Sugars  Phone Note Call from Patient Call back at Home Phone [phone]   Caller: Patient Summary of Call: patient called and left voice message stating she has continued to have elevated blood sugars ranging from from the 190's to 200's. She is scheduled for a follow up on 02/23/2009 Initial call taken by: Jiles Garter CMA,  February 06, 2009 2:48 PM  Follow-up for Phone Call        find out whether elevated blood sugar in AM or after meals Follow-up by: D. Drema Pry DO,  February 06, 2009 4:53 PM  Additional Follow-up for Phone Call Additional follow up Details #1::        patient states her blood sugars are elevate all day long, she states she is unable to get blood sugar below 150. Additional Follow-up by: Jiles Garter CMA,  February 06, 2009 5:32 PM    Additional Follow-up for Phone Call Additional follow up Details #2::    patient advised to take Januvia 50mg  per Dr Shawna Orleans instructions. She states that she was previously taking this medication and already has a rx for this. Patient informed she has  a rx at the pharmacy if needed. Patient verbalized understanding and agrees Follow-up by: Jiles Garter CMA,  February 06, 2009 5:39 PM  New/Updated Medications: JANUVIA 50 MG TABS (SITAGLIPTIN PHOSPHATE) one by mouth qd Prescriptions: JANUVIA 50 MG TABS (SITAGLIPTIN PHOSPHATE) one by mouth qd  #30 x 1   Entered and Authorized by:   D. Drema Pry DO   Signed by:   D. Drema Pry DO on 02/06/2009   Method used:   Electronically to        [facility]. [phone]* (retail)       [address] [place], [place]  [address]       Ph: [phone] or [phone]       Fax: [phone]   RxID:   [id number]

## 2010-02-20 NOTE — Progress Notes (Signed)
Summary: Medication Refills  Phone Note Refill Request Message from:  Fax from Pharmacy on February 02, 2009 9:36 AM  [facility] Refill Request   Method Requested: Electronic Next Appointment Scheduled: 02/23/2009 @ 10:15 am Initial call taken by: Jiles Garter CMA,  February 02, 2009 9:37 AM    Prescriptions: LABETALOL HCL 200 MG TABS (LABETALOL HCL) one by mouth bid  #180 x 4   Entered by:   Jiles Garter CMA   Authorized by:   D. Drema Pry DO   Signed by:   Jiles Garter CMA on 02/02/2009   Method used:   Electronically to        [facility] (mail-order)             ,          Ph: [phone]       Fax: [phone]   [id number] AMLODIPINE BESYLATE 5 MG TABS (AMLODIPINE BESYLATE) one by mouth once daily  #90 x 4   Entered by:   Jiles Garter CMA   Authorized by:   D. Drema Pry DO   Signed by:   Jiles Garter CMA on 02/02/2009   Method used:   Electronically to        [facility] (mail-order)             ,          Ph: [phone]       Fax: [phone]   RxID:   [id number] FREESTYLE LANCETS  MISC (LANCETS) use six times a day  #540 x 0   Entered by:   Jiles Garter CMA   Authorized by:   D. Drema Pry DO   Signed by:   Jiles Garter CMA on 02/02/2009   Method used:   Electronically to        [facility] (mail-order)             ,          Ph: [phone]       Fax: [phone]   RxID:   [id number] FREESTYLE LITE TEST  STRP (GLUCOSE BLOOD) use six times a day  #540 x 0   Entered by:   Jiles Garter CMA   Authorized by:   D. Drema Pry DO   Signed by:   Jiles Garter CMA on 02/02/2009   Method used:   Electronically to        [facility] (mail-order)             ,          Ph: [phone]       Fax: [phone]   RxID:   [id number] LIPITOR 40 MG TABS (ATORVASTATIN CALCIUM) 1 by mouth once daily  #90 x 4   Entered by:   Jiles Garter CMA   Authorized by:   D. Drema Pry DO   Signed by:   Jiles Garter CMA  on 02/02/2009   Method used:   Electronically to        [facility] (mail-order)             ,          Ph: [phone]       Fax: [phone]   RxID:   [id number]

## 2010-02-20 NOTE — Assessment & Plan Note (Signed)
Summary: sore throat/mhf   Vital Signs:  Patient profile:   60 year old female Height:      64 inches Weight:      250.75 pounds BMI:     43.20 O2 Sat:      96 % on Room air Temp:     98.4 degrees F oral Pulse rate:   76 / minute Pulse rhythm:   regular Resp:     18 per minute BP sitting:   150 / 80  (right arm) Cuff size:   large  Vitals Entered By: Jiles Garter CMA (May 26, 2009 2:05 PM)  O2 Flow:  Room air CC: Rm 2- Throat irritation Is Patient Diabetic? Yes   Primary Care Provider:  Jennings Books DO  CC:  Rm 2- Throat irritation.  History of Present Illness: 60 y/o white female c/o URI symptoms wed night - felt like she was around a lot of dust feels very run down eyes were matted this AM this AM sore throat sinus pressure  secretions are clear c/o URI symptoms wed night - felt like she was around a lot of dust feels very run down eyes were matted this AM this AM sore throat sinus pressure  secretions are clear    Allergies: 1)  ! Codeine 2)  ! Demerol 3)  ! 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   Ears:  R ear normal and L ear normal.   Mouth:  pharyngeal erythema.   Neck:  supple and no masses.  no carotid bruits.   Lungs:  normal respiratory effort, normal breath sounds, no crackles, and no wheezes.   Heart:  normal rate, regular rhythm, and no gallop.     Impression & Recommendations:  Problem # 1:  URI (ICD-465.9)  Instructed on symptomatic treatment. Call if symptoms persist or worsen.  Gargle with warm salt water.  Rx for  ceftin provided.  we discussed signs of bacterial sinusitis and appropriate use of abx  Complete Medication List: 1)  Paroxetine Hcl 30 Mg Tabs (Paroxetine hcl) .... One by mouth bid 2)  Alprazolam 1 Mg Tabs (Alprazolam) .... Take 1 tablet by mouth two times a day 3)  Lipitor 40 Mg Tabs (Atorvastatin calcium) .Marland Kitchen.. 1 by mouth once daily 4)  Vitamin B-12 Cr 1000 Mcg Tbcr (Cyanocobalamin) .... Inject q month 5)  Labetalol Hcl 200 Mg Tabs (Labetalol hcl) .... One by mouth bid 6)  Mysoline 250 Mg Tabs (Primidone) .Marland Kitchen.. 1 tablet by mouth  every morning and three tablets by mouth at bedtime 7)  Amlodipine Besylate 5 Mg Tabs (Amlodipine besylate) .... One by mouth once daily 8)  Mirtazapine 15 Mg Tabs (Mirtazapine) .... 1/2 at bedtime 9)  Calcium 600/vitamin D 600-400 Mg-unit Tabs (Calcium carbonate-vitamin d) .... Take 1 tablet by mouth once daily 10)  Premarin 0.9 Mg Tabs (Estrogens conjugated) .... Take 1 tablet by mouth every morning 11)  Fish Oil 1000 Mg Caps (Omega-3 fatty acids) .... 4 tablets by mouth  every morning 12)  Furosemide 20 Mg Tabs (Furosemide) .... Take 1  tablet by mouth two times a day 13)  Ferrous Sulfate 325 (65 Fe) Mg Tabs (Ferrous sulfate) .... Take 1 tablet by mouth once a day 14)  Lantus Solostar 100 Unit/ml Soln (Insulin glargine) .... 35- 50  units q pm 15)  Apidra Solostar 100 Unit/ml Soln (Insulin glulisine) .Marland Kitchen.. 10-20 units subcutaneously before breakfast and dinner 16)  Freestyle Lite Test Strp (Glucose blood) .... Use six times a day 17)  Freestyle Lancets Misc (Lancets) .... Use six times a day 18)  Januvia 50 Mg Tabs (Sitagliptin phosphate) .... One by mouth qd 19)  Bd Pen Needle Ultrafine 29g X 12.53mm Misc (Insulin pen needle) .... Use three times a day as directed 20)  Cefuroxime Axetil 500 Mg Tabs (Cefuroxime axetil) .... One by mouth two times a day  Patient Instructions: 1)  Call our office if your symptoms do not  improve or gets worse. 2)  You can use nasal  saline spray over the counter 3)  You can use mucinex over the counter Prescriptions: CEFUROXIME AXETIL 500 MG TABS (CEFUROXIME AXETIL) one by mouth two times a day  #14 x 0   Entered and Authorized by:   D. Drema Pry DO   Signed by:   D. Drema Pry DO on 05/26/2009   Method used:   Print then Give to Patient   RxID:   [id number]   Current Allergies (reviewed today): ! CODEINE ! DEMEROL ! * ADVICOR

## 2010-02-20 NOTE — Consult Note (Signed)
Summary: [facility]   Imported By: Edmonia James 03/21/2009 11:42:48  _____________________________________________________________________  External Attachment:    Type:   Image     Comment:   External Document

## 2010-02-20 NOTE — Progress Notes (Signed)
Summary: Would like to know labs result numbers   Phone Note Call from Patient   Caller: Patient Summary of Call: [phone].... Pt. called and is requesting to know her actual lab results. She would like to know the numbers. Still feeling really tired. Thank you.Otho Ket  February 24, 2009 3:52 PM   Follow-up for Phone Call        I will mail lab results Follow-up by: D. Drema Pry DO,  February 24, 2009 4:43 PM  Additional Follow-up for Phone Call Additional follow up Details #1::        I did inform pt. that you would be mailing lab results to her Additional Follow-up by: Otho Ket,  February 27, 2009 2:23 PM

## 2010-02-20 NOTE — Assessment & Plan Note (Signed)
Summary: gall bladder problem? b 12 inj/mhf   Vital Signs:  Patient profile:   60 year old female Weight:      252.50 pounds BMI:     43.50 O2 Sat:      97 % on Room air Temp:     98.4 degrees F oral Pulse rate:   80 / minute Pulse rhythm:   regular Resp:     18 per minute BP sitting:   120 / 60  (right arm) Cuff size:   large  Vitals Entered By: Jiles Garter CMA (July 27, 2009 11:38 AM)  O2 Flow:  Room air CC: Rm 4- Gall Bladder Problems Is Patient Diabetic? Yes Comments c/o pain in upper right adbomen with positional discomfrot, ongoing for the past several weeks, and worsened this week   Primary Care Provider:  Jennings Books DO  CC:  Rm 4- Gall Bladder Problems.  History of Present Illness: [name] is a 60 year old female who presents today with several week history of RUQ discomfort (dull aching pain).  Worse with movement, some times so sharp "it takes my breath."  + nausea- almost vomitted yesterday.  Last night she went out for dinner.  Symptoms were made worse by eating.  Denies fever.  She has not taken any OTC meds.  Pain is improved with stretching and laying on her left side.    Allergies: 1)  ! Codeine 2)  ! Demerol 3)  ! 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 No apparent alopecia or balding. Lungs:  Normal respiratory effort, chest expands symmetrically. Lungs are clear to auscultation, no crackles or wheezes. Heart:  Normal rate and regular rhythm. S1 and S2 normal without gallop, murmur, click, rub or other extra sounds. Abdomen:  Exam is limited by habitus.  + tenderness to deep palpation of RUQ.  No guarding, + BS's, abdomen is soft   Impression & Recommendations:  Problem # 1:  ABDOMINAL PAIN, RIGHT UPPER QUADRANT (ICD-789.01) Assessment New Labs as listed below is unrevealing.  [place] negative for cholelithiasis- incidental note made of fatty liver.  Suspect that her pain is musculoskeletal in nature.  Orders: Ultrasound (Ultrasound) T-Hepatic Function [phone]) T-CBC w/Diff [id number]) T-Amylase [phone]) T-Lipase [phone])  Complete Medication List: 1)  Paroxetine Hcl 30 Mg Tabs (Paroxetine hcl) .... One by mouth two times a day 2)  Alprazolam 1 Mg Tabs (Alprazolam) .... Take 1 tablet by mouth two times a day 3)  Lipitor 40 Mg  Tabs (Atorvastatin calcium) .Marland Kitchen.. 1 by mouth once daily 4)  Vitamin B-12 Cr 1000 Mcg Tbcr (Cyanocobalamin) .... Inject q month 5)  Labetalol Hcl 200 Mg Tabs (Labetalol hcl) .... One by mouth two times a day 6)  Mysoline 250 Mg Tabs (Primidone) .Marland Kitchen.. 1 tablet by mouth  every morning and three tablets by mouth at bedtime 7)  Amlodipine Besylate 5 Mg Tabs (Amlodipine besylate) .... One by mouth once daily 8)  Mirtazapine 15 Mg Tabs (Mirtazapine) .... 1/2 at bedtime 9)  Calcium 600/vitamin D 600-400 Mg-unit Tabs (Calcium carbonate-vitamin d) .... Take 1 tablet by mouth once daily 10)   Premarin 0.9 Mg Tabs (Estrogens conjugated) .... Take 1 tablet by mouth every morning 11)  Fish Oil 1000 Mg Caps (Omega-3 fatty acids) .... 4 tablets by mouth  every morning 12)  Furosemide 20 Mg Tabs (Furosemide) .... Take 1 tablet by mouth two times a day 13)  Ferrous Sulfate 325 (65 Fe) Mg Tabs (Ferrous sulfate) .... Take 1 tablet by mouth once a day 14)  Lantus Solostar 100 Unit/ml Soln (Insulin glargine) .... 50 units q pm 15)  Apidra Solostar 100 Unit/ml Soln (Insulin glulisine) .Marland Kitchen.. 10-20 units subcutaneously before breakfast and dinner 16)  Freestyle Lite Test Strp (Glucose blood) .... Use six times a day 17)  Freestyle Lancets Misc (Lancets) .... Use six times a day 18)  Januvia 50 Mg Tabs (Sitagliptin phosphate) .... One by mouth qd 19)  Bd Pen Needle Ultrafine 29g X 12.23mm Misc (Insulin pen needle) .... Use three times a day as directed  Other Orders: Vit B12 1000 mcg (J3420) Admin of Therapeutic Inj  intramuscular or subcutaneous [id number])  Patient Instructions: 1)  Complete abdominal ultrasound as scheduled.  2)  Complete your blood work downstairs prior to leaving. 3)  Go to ER if you develop worsening abdominal pain.  Current Allergies (reviewed today): ! CODEINE ! DEMEROL ! 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 Patient Level IV [id number]

## 2010-02-20 NOTE — Letter (Signed)
   [facility] [place], [place]  [address]  [place] Phone: [phone]      February 24, 2009   [facility] [address] [place], [place] [address]  RE:  LAB RESULTS  Dear  Ms. Oldaker,  The following is an interpretation of your most recent lab tests.  Please take note of any instructions provided or changes to medications that have resulted from your lab work.  ELECTROLYTES:  Good - no changes needed  KIDNEY FUNCTION TESTS:  Stable - no changes needed     CBC:  Good - no changes needed  Iron levels - normal  B12  - good       Sincerely Yours,    Dr. Drema Pry

## 2010-02-20 NOTE — Progress Notes (Signed)
  Phone Note Outgoing Call   Call placed by: Nance Pear FNP,  July 28, 2009 1:27 PM Call placed to: Patient Summary of Call: Called patient, reviewed lab and ultrasound results.  Also discussed fatty liver, and need for diet exercise and weight loss.  Pain is better today per patient, plan for pt to f/u with dr. Shawna Orleans as scheduled on 7/18, sooner if worsening pain.  Pt verbalized understanding. Initial call taken by: Nance Pear FNP,  July 28, 2009 1:29 PM

## 2010-02-20 NOTE — Assessment & Plan Note (Signed)
Summary: 3 month follow up b 12 inj/mhf   Vital Signs:  Patient profile:   60 year old female Height:      64 inches (162.56 cm) Weight:      255.50 pounds (116.14 kg) BMI:     44.02 Temp:     99.0 degrees F (37.22 degrees C) oral BP sitting:   122 / 78  (right arm) Cuff size:   large  Vitals Entered By: Ernestene Mention CMA (August 29, 2009 3:38 PM) CC: 3 mo f/u and B-12 inj./kb, Type 2 diabetes mellitus follow-up Is Patient Diabetic? Yes Pain Assessment Patient in pain? no        Primary Care Provider:  Jennings Books DO  CC:  3 mo f/u and B-12 inj./kb and Type 2 diabetes mellitus follow-up.  History of Present Illness:  Type 2 Diabetes Mellitus Follow-Up      This is a 60 year old woman who presents for Type 2 diabetes mellitus follow-up.  The patient reports weight gain.  The patient denies the following symptoms: chest pain.  Since the last visit the patient reports good dietary compliance, compliance with medications, and monitoring blood glucose. pt frustrated with wt gain.  she is trying her best with dietary compliance    Current Medications (verified): 1)  Paroxetine Hcl 30 Mg Tabs (Paroxetine Hcl) .... One By Mouth Two Times A Day 2)  Alprazolam 1 Mg  Tabs (Alprazolam) .... Take 1 Tablet By Mouth Two Times A Day 3)  Lipitor 40 Mg Tabs (Atorvastatin Calcium) .Marland Kitchen.. 1 By Mouth Once Daily 4)  Vitamin B-12 Cr 1000 Mcg  Tbcr (Cyanocobalamin) .... Inject Q Month 5)  Labetalol Hcl 200 Mg Tabs (Labetalol Hcl) .... One By Mouth Two Times A Day 6)  Mysoline 250 Mg  Tabs (Primidone) .Marland Kitchen.. 1 Tablet By Mouth  Every Morning and Three Tablets By Mouth At Bedtime 7)  Amlodipine Besylate 5 Mg Tabs (Amlodipine Besylate) .... One By Mouth Once Daily 8)  Mirtazapine 15 Mg Tabs (Mirtazapine) .... 1/2 At Bedtime 9)  Calcium 600/vitamin D 600-400 Mg-Unit Tabs (Calcium Carbonate-Vitamin D) .... Take 1 Tablet By Mouth Once Daily 10)  Premarin 0.9 Mg Tabs (Estrogens Conjugated) .... Take 1 Tablet  By Mouth Every Morning 11)  Fish Oil 1000 Mg Caps (Omega-3 Fatty Acids) .... 4 Tablets By Mouth  Every Morning 12)  Furosemide 20 Mg Tabs (Furosemide) .... Take 1 Tablet By Mouth Two Times A Day 13)  Ferrous Sulfate 325 (65 Fe) Mg Tabs (Ferrous Sulfate) .... Take 1 Tablet By Mouth Once A Day 14)  Lantus Solostar 100 Unit/ml Soln (Insulin Glargine) .... 50 Units Q Pm 15)  Apidra Solostar 100 Unit/ml Soln (Insulin Glulisine) .Marland Kitchen.. 10-20 Units Subcutaneously Before Breakfast and Dinner 16)  Freestyle Lite Test  Strp (Glucose Blood) .... Use Six Times A Day 17)  Freestyle Lancets  Misc (Lancets) .... Use Six Times A Day 18)  Januvia 50 Mg Tabs (Sitagliptin Phosphate) .... One By Mouth Qd 19)  Bd Pen Needle Ultrafine 29g X 12.32mm Misc (Insulin Pen Needle) .... Use Three Times A Day As Directed  Allergies (verified): 1)  ! Codeine 2)  ! Demerol 3)  ! 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   Lungs:  Normal respiratory effort, chest expands symmetrically. Lungs are clear to auscultation, no crackles or wheezes. Heart:  Normal rate and regular rhythm. S1 and S2 normal without gallop, murmur, click, rub or other extra sounds. Extremities:  No lower extremity edema   Impression & Recommendations:  Problem # 1:  DIABETES MELLITUS, TYPE II (ICD-250.00) weight gain from insulin use.  I advised  further decrease in carb intake.    The following medications were removed from the medication list:    Januvia 50 Mg Tabs (Sitagliptin phosphate) ..... One by mouth qd Her updated medication list for this problem includes:    Lantus Solostar 100 Unit/ml Soln (Insulin glargine) .Marland KitchenMarland KitchenMarland KitchenMarland Kitchen 50 units q pm    Apidra Solostar 100 Unit/ml Soln (Insulin glulisine) .Marland Kitchen... 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 Inject q month    Ferrous Sulfate 325 (65 Fe) Mg Tabs (Ferrous sulfate) .Marland Kitchen... Take 1 tablet by mouth once a day  Orders: Vit B12 1000 mcg (J3420) Admin of Therapeutic Inj  intramuscular or subcutaneous [id number])  Complete Medication List: 1)  Paroxetine Hcl 30 Mg Tabs (Paroxetine hcl) .... One by mouth two times a day 2)  Alprazolam 1 Mg Tabs (Alprazolam) .... Take 1 tablet by mouth two times a day 3)  Lipitor 40 Mg Tabs (Atorvastatin calcium) .Marland Kitchen.. 1 by mouth once daily 4)  Vitamin B-12 Cr 1000 Mcg Tbcr (Cyanocobalamin) .... Inject q month 5)  Labetalol Hcl 200 Mg Tabs (Labetalol hcl) .... One by mouth two times a day 6)  Mysoline 250 Mg Tabs (Primidone) .Marland Kitchen.. 1 tablet by mouth  every morning and three tablets by mouth at bedtime 7)  Amlodipine Besylate 5 Mg Tabs (Amlodipine besylate) .... One by mouth once daily 8)  Mirtazapine 15 Mg Tabs (Mirtazapine) .... 1/2 at bedtime 9)  Calcium 600/vitamin D 600-400 Mg-unit Tabs (Calcium carbonate-vitamin d) .... Take 1 tablet by mouth once daily 10)  Premarin 0.9 Mg Tabs (Estrogens conjugated) .... Take 1 tablet by mouth every morning 11)  Fish Oil 1000 Mg Caps (Omega-3 fatty acids) .... 4 tablets by mouth  every morning 12)  Furosemide  20 Mg Tabs (Furosemide) .... Take 1 tablet by mouth two times a day 13)  Ferrous Sulfate 325 (65 Fe) Mg Tabs (Ferrous sulfate) .... Take 1 tablet by mouth once a day 14)  Lantus Solostar 100 Unit/ml Soln (Insulin glargine) .... 50 units q pm 15)  Apidra Solostar 100 Unit/ml Soln (Insulin glulisine) .... 20 - 25  units subcutaneously before breakfast and dinner 16)  Freestyle Lite Test Strp (Glucose blood) .... Use six times a day 17)  Freestyle Lancets Misc (Lancets) .... Use six times a day 18)  Bd Pen Needle Ultrafine 29g X 12.67mm Misc (Insulin pen needle) .Marland KitchenMarland KitchenMarland Kitchen  Use three times a day as directed 19)  Tradjenta 5 Mg  .... One by mouth once daily  Patient Instructions: 1)  Please schedule a follow-up appointment in 1 month. 2)  BMP prior to visit, ICD-9: 401.9 3)  HbgA1C prior to visit, ICD-9:  250.02 4)  Urine Microalbumin prior to visit, ICD-9:  250.02 5)  Please return for lab work one (1) week before your next appointment.  Prescriptions: APIDRA SOLOSTAR 100 UNIT/ML SOLN (INSULIN GLULISINE) 20 - 25  units Subcutaneously before breakfast and dinner  #1 month x 3   Entered and Authorized by:   D. Drema Pry DO   Signed by:   D. Drema Pry DO on 08/29/2009   Method used:   Electronically to        [facility] (retail)       [address]       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 Patient Level III [id number]

## 2010-02-20 NOTE — Progress Notes (Signed)
Summary: Test Strip Refill  Phone Note Refill Request Message from:  Fax from Pharmacy on April 12, 2009 9:32 AM  Refills Requested: Medication #1:  FREESTYLE LITE TEST  STRP use six times a day  Method Requested: Electronic Next Appointment Scheduled: 04/28/2009 Initial call taken by: Jiles Garter CMA,  April 12, 2009 9:32 AM  Follow-up for Phone Call        Rx completed in Dr. Brantley Stage Follow-up by: Jiles Garter CMA,  April 12, 2009 9:33 AM    Prescriptions: FREESTYLE LITE TEST  STRP (GLUCOSE BLOOD) use six times a day  #540 x 1   Entered by:   Jiles Garter CMA   Authorized by:   D. Drema Pry DO   Signed by:   Jiles Garter CMA on 04/12/2009   Method used:   Electronically to        [facility] (mail-order)             ,          Ph: [phone]       Fax: [phone]   RxID:   [id number]

## 2010-02-20 NOTE — Assessment & Plan Note (Signed)
Summary: B-12 INJECTION/HEA  Nurse Visit   Allergies: 1)  ! Codeine 2)  ! Demerol 3)  ! 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

## 2010-02-20 NOTE — Assessment & Plan Note (Signed)
Summary: 1 month fu & b12/dt   Vital Signs:  Patient profile:   60 year old female Height:      64 inches Weight:      253.25 pounds BMI:     43.63 O2 Sat:      99 % on Room air Temp:     98.4 degrees F oral Pulse rate:   77 / minute Pulse rhythm:   regular Resp:     18 per minute BP sitting:   122 / 80  (left arm) Cuff size:   large  Vitals Entered By: Jiles Garter CMA (October 03, 2009 2:31 PM)  O2 Flow:  Room air CC: 1 month follow up, Type 2 diabetes mellitus follow-up Is Patient Diabetic? Yes Did you bring your meter with you today? No Pain Assessment Patient in pain? no      Comments refill on Apidra to [facility], low blood sugar 119 high 172 avg 158-elevation is in the morning     Primary Care Provider:  D. Drema Pry DO  CC:  1 month follow up and Type 2 diabetes mellitus follow-up.  History of Present Illness:  Type 2 Diabetes Mellitus Follow-Up      This is a right-handed patient who presents with Type 2 diabetes mellitus follow-up.  The patient denies self managed hypoglycemia, hypoglycemia requiring help, and weight gain.  The patient denies the following symptoms: chest pain.  Since the last visit the patient reports good dietary compliance, compliance with medications, and monitoring blood glucose.  tradjenta working well  grandson born healthy.  he doing well    Preventive Screening-Counseling & Management  Alcohol-Tobacco     Smoking Status: never  Allergies: 1)  ! Codeine 2)  ! Demerol 3)  ! 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   Ears:  L ear normal.   Lungs:  Normal respiratory effort, chest expands symmetrically. Lungs are clear to auscultation, no crackles or wheezes. Heart:  Normal rate and regular rhythm. S1 and S2 normal without gallop, murmur, click, rub or other extra sounds. Extremities:  No lower extremity edema   Impression & Recommendations:  Problem # 1:  DIABETES MELLITUS, TYPE II (ICD-250.00) Assessment Improved good response to tradjenta.  helping with appetite control.  we discussed ways to further decrease carbohydrate intake  Her updated medication list for this problem includes:    Lantus Solostar 100 Unit/ml Soln (Insulin glargine) .Marland KitchenMarland KitchenMarland KitchenMarland Kitchen 50 units q pm    Apidra Solostar 100 Unit/ml Soln (Insulin glulisine) .Marland Kitchen... 20 - 25  units subcutaneously before breakfast and dinner    Tradjenta 5 Mg Tabs (Linagliptin) ..... One by mouth qd  Orders: T-Basic Metabolic Panel (99991111) T- Hemoglobin A1C [id number]) T-Urine Microalbumin w/creat. ratio [phone])  Problem # 2:  RENAL INSUFFICIENCY (ICD-588.9) Assessment: Improved  Complete Medication List: 1)  Paroxetine Hcl 30 Mg Tabs (Paroxetine hcl) .... One by mouth two times a day 2)  Alprazolam 1 Mg  Tabs (Alprazolam) .... Take 1 tablet by mouth two times a day 3)  Lipitor 40 Mg Tabs (Atorvastatin calcium) .Marland Kitchen.. 1 by mouth once daily 4)  Vitamin B-12 Cr 1000 Mcg Tbcr (Cyanocobalamin) .... Inject q month 5)  Labetalol Hcl 200 Mg Tabs (Labetalol hcl) .... One by mouth two times a day 6)  Mysoline 250 Mg Tabs (Primidone) .Marland Kitchen.. 1 tablet by mouth  every morning and three tablets by mouth at bedtime 7)  Amlodipine Besylate 5 Mg Tabs (Amlodipine besylate) .... One by mouth once daily 8)  Mirtazapine 15 Mg Tabs (Mirtazapine) .... 1/2 at bedtime 9)  Calcium 600/vitamin D 600-400 Mg-unit Tabs (Calcium carbonate-vitamin d) .... Take 1 tablet by mouth  once daily 10)  Premarin 0.9 Mg Tabs (Estrogens conjugated) .... Take 1 tablet by mouth every morning 11)  Fish Oil 1000 Mg Caps (Omega-3 fatty acids) .... 4 tablets by mouth  every morning 12)  Furosemide 20 Mg Tabs (Furosemide) .... Take 1 tablet by mouth two times a day 13)  Ferrous Sulfate 325 (65 Fe) Mg Tabs (Ferrous sulfate) .... Take 1 tablet by mouth once a day 14)  Lantus Solostar 100 Unit/ml Soln (Insulin glargine) .... 50 units q pm 15)  Apidra Solostar 100 Unit/ml Soln (Insulin glulisine) .... 20 - 25  units subcutaneously before breakfast and dinner 16)  Freestyle Lite Test Strp (Glucose blood) .... Use six times a day 17)  Freestyle Lancets Misc (Lancets) .... Use six times a day 18)  Bd Pen Needle Ultrafine 29g X 12.1mm Misc (Insulin pen needle) .... Use three times a day as directed 19)  Tradjenta 5 Mg Tabs (Linagliptin) .... One by mouth qd  Other Orders: Influenza Vaccine NON MCR [id number]) Flu Vaccine 23yrs + MEDICARE PATIENTS [id number]) Vit B12 1000 mcg (J3420) Admin of Therapeutic Inj  intramuscular or subcutaneous [id number])  Patient Instructions: 1)  Please schedule a follow-up appointment in 3 months. 2)  BMP prior to visit, ICD-9: 250.02 3)  HbgA1C prior to visit, ICD-9: 250.02 4)  Hepatic Panel prior to visit, ICD-9: 272.4 5)  Lipid Panel prior to visit, ICD-9: 272.4 6)  vit B12 level:  281.0 7)  Please return for lab work one (1) week before your next appointment.  Prescriptions: APIDRA SOLOSTAR 100 UNIT/ML SOLN (INSULIN GLULISINE) 20 - 25  units Subcutaneously before breakfast and dinner  #3 month x 1   Entered and Authorized by:   D. Drema Pry DO   Signed by:   D. Drema Pry DO on 10/03/2009   Method used:   Electronically to        [facility] (retail)             ,          Ph: [phone]       Fax: [phone]   [id number] TRADJENTA 5 MG TABS (LINAGLIPTIN) one by mouth qd  #90 x 3   Entered and Authorized by:   D. Drema Pry DO   Signed  by:   D. 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 ratio [[id number]] 8)  Est. Patient Level III [id number]   Current Allergies (reviewed today): ! CODEINE ! DEMEROL ! * ADVICOR    Immunizations Administered:  Influenza Vaccine # 1:    Vaccine Type: Fluvax Non-MCR    Site: right deltoid    Mfr: GlaxoSmithKline    Dose: 0.5 ml    Route: IM    Given by: Jiles Garter CMA    Exp. Date: 07/21/2010    Lot #: [id number]    VIS given: 08/15/09 version given October 03, 2009.  Flu Vaccine Consent Questions:    Do you have a history of severe allergic reactions to this vaccine? no    Any prior history of allergic reactions to egg and/or gelatin? no    Do you have a sensitivity to the preservative Thimersol? no    Do you have a past history of Guillan-Barre Syndrome? no    Do you currently have an acute febrile illness? no    Have you ever had a severe reaction to latex? no    Vaccine information given and explained to patient? yes    Are you currently pregnant? no

## 2010-02-20 NOTE — Assessment & Plan Note (Signed)
Summary: 3 week follow up/mhf   Vital Signs:  Patient profile:   60 year old female Weight:      238.50 pounds BMI:     41.09 O2 Sat:      98 % on Room air Pulse rate:   72 / minute Pulse rhythm:   regular Resp:     18 per minute BP sitting:   122 / 80  (left arm) Cuff size:   large  Vitals Entered By: [name] CMA (March 16, 2009 10:40 AM)  O2 Flow:  Room air CC: RM 2- Week Follow up , Type 2 diabetes mellitus follow-up Is Patient Diabetic? Yes Comments low blood sugar 87 high 303 avg 200's elevation is usually in the morning she is at 17 untis of Apidra two times a day and 32 untis of Lantus at bedtime, 2 weeks ago she had an episode where she could not sllep for a period of 2 days, and beleives that it may be related to her iron   Primary Care Provider:  DDrema Pry DO  CC:  RM 2- Week Follow up  and Type 2 diabetes mellitus follow-up.  History of Present Illness:  Type 2 Diabetes Mellitus Follow-Up      This is a 60 year old woman who presents for Type 2 diabetes mellitus follow-up.  The patient reports weight gain, but denies self managed hypoglycemia and hypoglycemia requiring help.  The patient denies the following symptoms: chest pain.  Since the last visit the patient reports good dietary compliance, compliance with medications, and monitoring blood glucose.  blood sugar log reviewed    Allergies: 1)  ! Codeine 2)  ! Demerol 3)  ! 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   Lungs:  normal respiratory effort and normal breath sounds.   Heart:  normal rate, regular rhythm, and no gallop.   Extremities:  trace left pedal edema and trace right pedal edema.     Impression & Recommendations:  Problem # 1:  DIABETES MELLITUS, TYPE II (ICD-250.00) CBGs improving.  Pt understands how to titrate lantus and apidra.  she has modified her diet and eating less carbs.   OSA may be exacerbating factor.  awaiting results of sleep test  Her updated medication list for this problem includes:    Lantus Solostar 100 Unit/ml Soln (Insulin glargine) .Marland Kitchen... 20 - 35 units q pm    Apidra Solostar 100 Unit/ml Soln (Insulin glulisine) .Marland KitchenMarland KitchenMarland KitchenMarland Kitchen 10-20 units subcutaneously before breakfast and dinner    Januvia 50 Mg Tabs (Sitagliptin phosphate) ..... One by mouth qd  Labs Reviewed: Creat: 1.67 (02/23/2009)     Last Eye Exam: normal (07/21/2008) Reviewed HgBA1c results: 9.1 (12/22/2008)  7.1 (09/22/2008)  Complete Medication List: 1)  Paroxetine Hcl 30 Mg Tabs (Paroxetine hcl) .... One by mouth bid 2)  Alprazolam 1 Mg Tabs (Alprazolam) .... Take 1 tablet by mouth two times a day 3)  Lipitor 40 Mg Tabs (Atorvastatin calcium) .Marland Kitchen.. 1 by mouth once daily  4)  Vitamin B-12 Cr 1000 Mcg Tbcr (Cyanocobalamin) .... Inject q month 5)  Labetalol Hcl 200 Mg Tabs (Labetalol hcl) .... One by mouth bid 6)  Mysoline 250 Mg Tabs (Primidone) .Marland Kitchen.. 1 tablet by mouth  every morning and three tablets by mouth at bedtime 7)  Amlodipine Besylate 5 Mg Tabs (Amlodipine besylate) .... One by mouth once daily 8)  Mirtazapine 15 Mg Tabs (Mirtazapine) .... 1/2 at bedtime 9)  Calcium 600/vitamin D 600-400 Mg-unit Tabs (Calcium carbonate-vitamin d) .... Take 1 tablet by mouth once daily 10)  Premarin 0.9 Mg Tabs (Estrogens conjugated) .... Take 1 tablet by mouth every morning 11)  Fish Oil 1000 Mg Caps (Omega-3 fatty acids) .... 4 tablets by mouth  every morning 12)  Furosemide 20 Mg Tabs  (Furosemide) .... Take 1 tablet by mouth two times a day 13)  Ferrous Sulfate 325 (65 Fe) Mg Tabs (Ferrous sulfate) .... Take 1 tablet by mouth once a day 14)  Lantus Solostar 100 Unit/ml Soln (Insulin glargine) .... 20 - 35 units q pm 15)  Apidra Solostar 100 Unit/ml Soln (Insulin glulisine) .Marland Kitchen.. 10-20 units subcutaneously before breakfast and dinner 16)  Freestyle Lite Test Strp (Glucose blood) .... Use six times a day 17)  Freestyle Lancets Misc (Lancets) .... Use six times a day 18)  Januvia 50 Mg Tabs (Sitagliptin phosphate) .... One by mouth qd 19)  Bd Pen Needle Ultrafine 29g X 12.35mm Misc (Insulin pen needle) .... Use three times a day as directed  Patient Instructions: 1)  Please schedule a follow-up appointment in 3 months. 2)  BMP prior to visit, ICD-9: 250.02 3)  HbgA1C prior to visit, ICD-9: 250.02 4)  Please return for lab work one (1) week before your next appointment.   Current Allergies (reviewed today): ! CODEINE ! DEMEROL ! * ADVICOR

## 2010-02-20 NOTE — Assessment & Plan Note (Signed)
Summary: B12/DT  Nurse Visit   Vitals Entered By: Kelle Darting [place] Deborra Medina) (November 06, 2009 2:48 PM) CC: Pt here for B12 injection per order.   Allergies: 1)  ! Codeine 2)  ! Demerol 3)  ! 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

## 2010-02-20 NOTE — Progress Notes (Signed)
Summary: refill request for Lipitor  Phone Note Refill Request Message from:  Pharmacy on January 27, 2009 9:09 AM  Refills Requested: Medication #1:  LIPITOR 40 MG TABS 1 by mouth once daily Dr. Loanne Drilling had informed [facility] that Dr.Yoo has increased her med. to 40mg  so [facility] called [place] for a refill request. Call back pharmacy at [phone]  Next Appointment Scheduled: 02/23/09 @10 :15 Initial call taken by: Otho Ket,  January 27, 2009 9:10 AM  Follow-up for Phone Call        Pt is no longer seeing Dr Loanne Drilling. [place] to have them take Dr Cordelia Pen name off of the list.  Follow-up by: Gardenia Phlegm CMA,  January 27, 2009 1:25 PM

## 2010-02-20 NOTE — Letter (Signed)
Summary: [facility]   Imported By: Edmonia James 07/12/2009 10:52:06  _____________________________________________________________________  External Attachment:    Type:   Image     Comment:   External Document

## 2010-02-20 NOTE — Assessment & Plan Note (Signed)
Summary: B 12 INJ/MHF  Nurse Visit   Allergies: 1)  ! Codeine 2)  ! Demerol 3)  ! 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

## 2010-02-20 NOTE — Progress Notes (Signed)
Summary: Lipitor Refill  Phone Note Refill Request Message from:  Pharmacy on January 27, 2009 1:53 PM  Refills Requested: Medication #1:  LIPITOR 40 MG TABS 1 by mouth once daily   Dosage confirmed as above?Dosage Confirmed   Brand Name Necessary? Yes   Supply Requested: 1 month Initial call taken by: Otho Ket,  January 27, 2009 1:53 PM  Follow-up for Phone Call        ok to refill x 3 Follow-up by: D. Drema Pry DO,  January 27, 2009 4:50 PM  Additional Follow-up for Phone Call Additional follow up Details #1::        Rx  sent electronically to pharmacy Additional Follow-up by: Jiles Garter CMA,  January 27, 2009 5:28 PM    Prescriptions: LIPITOR 40 MG TABS (ATORVASTATIN CALCIUM) 1 by mouth once daily  #30 x 3   Entered by:   Jiles Garter CMA   Authorized by:   D. Drema Pry DO   Signed by:   Jiles Garter CMA on 01/27/2009   Method used:   Electronically to        [facility]. [phone]* (retail)       [address] [place], [place]  [address]       Ph: [phone] or [phone]       Fax: [phone]   RxID:   [id number]

## 2010-02-20 NOTE — Assessment & Plan Note (Signed)
Summary: b12 inj/mhf  Nurse Visit CC: Nurse visit for Vitamin B12 injection.   Allergies: 1)  ! Codeine 2)  ! Demerol 3)  ! 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

## 2010-02-20 NOTE — Progress Notes (Signed)
Summary: Sleep Study  Phone Note Call from Patient Call back at [phone]   Caller: Patient Call For: D. Drema Pry DO Summary of Call: patient called requesting results of sleep study. She was informed that I would check into it and give her a call back. Initial call taken by: Jiles Garter CMA,  Jun 02, 2009 10:32 AM  Follow-up for Phone Call        sleep study showed small number of obstructive events that do not meet criteria for OSA Follow-up by: D. Drema Pry DO,  Jun 02, 2009 11:57 AM  Additional Follow-up for Phone Call Additional follow up Details #1::        Left message on machine to return my call.  Kelle Darting CMA  Jun 02, 2009 1:34 PM   Pt returned my call and was notified of sleep study results.  Kelle Darting CMA  Jun 02, 2009 1:38 PM

## 2010-02-20 NOTE — Progress Notes (Signed)
Summary: LABS FAXED TO [facility]   Phone Note From Other Clinic   Caller: [place] Call For: YOO Summary of Call: [facility] LAB RESULTS FROM 02-23-2009.  FAXED REPORT TO [phone] Initial call taken by: Shanon Payor,  March 14, 2009 8:16 AM

## 2010-02-20 NOTE — Assessment & Plan Note (Signed)
Summary: b 12 inj [facility] per pt/dt  Nurse Visit   Vitals Entered By: Kelle Darting CMA Deborra Medina) (December 11, 2009 11:26 AM)  Allergies: 1)  ! Codeine 2)  ! Demerol 3)  ! 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

## 2010-02-20 NOTE — Miscellaneous (Signed)
Summary: Lab Orders  Clinical Lists Changes  Orders: Added new Test order of T-Basic Metabolic Panel (80048-22910) - Signed Added new Test order of T- Hemoglobin A1C (83036-23375) - Signed 

## 2010-02-20 NOTE — Letter (Signed)
   [facility] [place], [place]  [address]  [place] Phone: [phone]      October 04, 2009   [facility] [address] [place], [place] [address]  RE:  LAB RESULTS  Dear  Ms. Deland,  The following is an interpretation of your most recent lab tests.  Please take note of any instructions provided or changes to medications that have resulted from your lab work.  ELECTROLYTES:  Good - no changes needed  KIDNEY FUNCTION TESTS:  Good - no changes needed    DIABETIC STUDIES:  Improved - continue management Blood Glucose: 140   HgbA1C: 7.5   Microalbumin/Creatinine Ratio: 290.2          Sincerely Yours,    Dr. Drema Pry  Appended Document:  mailed

## 2010-02-21 ENCOUNTER — Encounter: Payer: Self-pay | Admitting: Internal Medicine

## 2010-02-22 NOTE — Assessment & Plan Note (Signed)
Summary: 3 MONTH FU/DT   Vital Signs:  Patient profile:   60 year old female Height:      64 inches Weight:      259.25 pounds BMI:     44.66 O2 Sat:      97 % on Room air Temp:     98.8 degrees F oral Pulse rate:   78 / minute Pulse rhythm:   regular Resp:     18 per minute BP sitting:   136 / 70  (left arm) Cuff size:   large  Vitals Entered By: Kelle Darting CMA Deborra Medina) (January 11, 2010 3:07 PM)  O2 Flow:  Room air CC: Pt here for 3 month follow up. Is Patient Diabetic? Yes Comments Pt states she has been going through depression and has let herself go. Glucometer was broken and has just gotten new meter. High BS 224  Low BS 184. Gilmore Laroche Fergerson CMA Deborra Medina)  January 11, 2010 3:17 PM    Primary Care Izel Hochberg:  Jennings Books DO  CC:  Pt here for 3 month follow up.Marland Kitchen  History of Present Illness: DM II control worse less activity due to leg pain.  symptoms occurred while she was x mas shopping also experienced tremors severe fatigue x 1 week  Preventive Screening-Counseling & Management  Alcohol-Tobacco     Alcohol drinks/day: 0     Alcohol Counseling: not indicated; patient does not drink     Smoking Status: never     Passive Smoke Exposure: no     Tobacco Counseling: not indicated; no tobacco use  Allergies: 1)  ! Codeine 2)  ! Demerol 3)  ! 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   Lungs:  Normal respiratory effort, chest expands symmetrically. Lungs are clear to auscultation, no crackles or wheezes. Heart:  Normal rate and regular rhythm. S1 and S2 normal without gallop, murmur, click, rub or other extra sounds. Extremities:  No lower extremity edema   Impression & Recommendations:  Problem # 1:  DIABETES MELLITUS, TYPE II (ICD-250.00) Assessment Deteriorated  Her updated medication list for this problem includes:    Lantus Solostar 100 Unit/ml Soln (Insulin glargine) .Marland KitchenMarland KitchenMarland KitchenMarland Kitchen 50 units q pm    Humalog Kwikpen 100 Unit/ml Soln (Insulin lispro (human)) .Marland Kitchen... 20 - 25  units subcutaneously before breakfast and dinner    Tradjenta 5 Mg Tabs (Linagliptin) ..... One by mouth qd  Labs Reviewed: Creat: 1.34 (01/02/2010)     Last Eye Exam: normal (07/21/2008) Reviewed HgBA1c results: 8.5 (01/02/2010)  7.5 (10/03/2009)  Problem # 2:  HYPERTENSION (ICD-401.9)  Her updated medication list for this problem includes:    Labetalol Hcl 200 Mg Tabs (Labetalol hcl) ..... One by mouth two times a day    Amlodipine Besylate 5 Mg  Tabs (Amlodipine besylate) ..... One by mouth once daily    Furosemide 20 Mg Tabs (Furosemide) .Marland Kitchen... 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 Inject q month    Ferrous Sulfate 325 (65 Fe) Mg Tabs (Ferrous sulfate) .Marland Kitchen... Take 1 tablet by mouth once a day  Orders: Admin of Therapeutic Inj  intramuscular or subcutaneous [id number]) Vit B12 1000 mcg (J3420)  Problem # 4:  RENAL INSUFFICIENCY (ICD-588.9) Assessment: Improved  Complete Medication List: 1)   Paroxetine Hcl 30 Mg Tabs (Paroxetine hcl) .... One by mouth two times a day 2)  Alprazolam 1 Mg Tabs (Alprazolam) .... Take 1 tablet by mouth two times a day 3)  Lipitor 40 Mg Tabs (Atorvastatin calcium) .Marland Kitchen.. 1 by mouth once daily 4)  Vitamin B-12 Cr 1000 Mcg Tbcr (Cyanocobalamin) .... Inject q month 5)  Labetalol Hcl 200 Mg Tabs (Labetalol hcl) .... One by mouth two times a day 6)  Mysoline 250 Mg Tabs (Primidone) .Marland Kitchen.. 1 tablet by mouth  every morning and three tablets by mouth at bedtime 7)  Amlodipine Besylate 5 Mg Tabs (Amlodipine besylate) .... One by mouth once daily 8)  Mirtazapine 15 Mg Tabs (Mirtazapine) .... 1/2 at bedtime 9)  Calcium 600/vitamin D 600-400 Mg-unit Tabs (Calcium carbonate-vitamin d) .... Take 1 tablet by mouth once daily 10)  Premarin 0.9 Mg Tabs (Estrogens conjugated) .... Take 1 tablet by mouth every morning 11)  Fish Oil 1000 Mg Caps (Omega-3 fatty acids) .... Take 2 tablets by mouth twice daily. 12)  Furosemide 20 Mg Tabs (Furosemide) .... Take 1 tablet by mouth two times a day 13)  Ferrous Sulfate 325 (65 Fe) Mg Tabs (Ferrous sulfate) .... Take 1 tablet by mouth once a day 14)  Lantus Solostar 100 Unit/ml Soln (Insulin glargine) .... 50 units q pm 15)  Humalog Kwikpen 100 Unit/ml Soln (Insulin lispro (human)) .... 20 - 25  units subcutaneously before breakfast and dinner 16)  Freestyle Lite Test Strp (Glucose blood) .... Use six times a day 17)  Freestyle Lancets Misc (Lancets) .... Use six times a day 18)  Bd Pen Needle Ultrafine 29g X 12.91mm Misc (Insulin pen needle) .... Use three times a day as directed 19)  Tradjenta 5 Mg Tabs (Linagliptin) .... One by mouth qd 20)  Freestyle Freedom Lite W/device Kit (Blood glucose monitoring suppl) .... Use to check blood sugar three times a day 250.00 21)  Ceftin 500 Mg Tabs (Cefuroxime axetil) .... One tablet by mouth two times a day for 10 days  Patient Instructions: 1)  Please schedule a follow-up appointment in 3  months. 2)  BMP prior to visit, ICD-9: 401.9 3)  HbgA1C prior to visit, ICD-9: 250.02 4)  Urine Microalbumin prior to visit, ICD-9: 250.02 5)  B12 level:  281.0 6)  Please return for lab work one (1) week before your next appointment.    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 Patient Level III [id number]     Current Allergies (reviewed today): ! CODEINE ! DEMEROL ! * ADVICOR

## 2010-02-22 NOTE — Progress Notes (Signed)
Summary: rx for new meter and sub for Apidra   Phone Note Call from Patient Call back at Home Phone [phone]   Caller: Patient Call For: [name]  Summary of Call: She is almost out of Apidra.  [facility] sent her a letter saying they are out.  Does Dr Shawna Orleans know of a drug store where she could get the Apidra or what would he like her to use in place of it.  She lost her meter to check her blood sugar. Could Dr Shawna Orleans call in Rx to [facility] on [address] for a new meter.   Initial call taken by: Shanon Payor,  January 02, 2010 9:55 AM  Follow-up for Phone Call        change to humalog ok to authorize rx for new glucometer Follow-up by: D. Drema Pry DO,  January 02, 2010 10:41 AM  Additional Follow-up for Phone Call Additional follow up Details #1::        call placed to patient at [phone], no answer. A detailed voice message was left informing patient per Dr Shawna Orleans instructions.  Message was left for patient to return cal lif any questions Additional Follow-up by: Jiles Garter CMA,  January 02, 2010 5:00 PM    New/Updated Medications: HUMALOG KWIKPEN 100 UNIT/ML SOLN (INSULIN LISPRO (HUMAN)) 20 - 25  units Subcutaneously before breakfast and dinner FREESTYLE FREEDOM LITE W/DEVICE KIT (BLOOD GLUCOSE MONITORING SUPPL) use to check blood sugar three times a day 250.00 Prescriptions: FREESTYLE FREEDOM LITE W/DEVICE KIT (BLOOD GLUCOSE MONITORING SUPPL) use to check blood sugar three times a day 250.00  #1 x 0   Entered by:   Jiles Garter CMA   Authorized by:   D. Drema Pry DO   Signed by:   Jiles Garter CMA on 01/02/2010   Method used:   Electronically to        [facility] (retail)       [address]       [place], [place]  [address]       Ph: [phone]       Fax: [phone]   RxID:   [id number] HUMALOG KWIKPEN 100 UNIT/ML SOLN (INSULIN LISPRO (HUMAN)) 20 - 25  units Subcutaneously before breakfast and dinner  #3 month x 1   Entered and  Authorized by:   D. Drema Pry DO   Signed by:   D. Drema Pry DO on 01/02/2010   Method used:   Electronically to        [facility] (retail)             ,          Ph: [phone]       Fax: [phone]   RxID:   [id number] HUMALOG KWIKPEN 100 UNIT/ML SOLN (INSULIN LISPRO (HUMAN)) 20 - 25  units Subcutaneously before breakfast and dinner  #1 month x 0   Entered and Authorized by:   D. Drema Pry DO   Signed by:   D. Drema Pry DO on 01/02/2010   Method used:   Electronically to        [facility] (retail)       [address]       [place], [place]  [address]       Ph: [phone]       Fax: [phone]   RxID:  [id number]  

## 2010-02-22 NOTE — Miscellaneous (Signed)
Summary: Orders Update  Clinical Lists Changes  Orders: Added new Test order of T-Basic Metabolic Panel ([id number]) - Signed Added new Test order of T- Hemoglobin A1C [id number]) - Signed Added new Test order of T-Hepatic Function [phone]) - Signed Added new Test order of T-Lipid Profile [phone]) - Signed Added new Test order of T-Vitamin B12 [id number]) - Signed

## 2010-02-22 NOTE — Progress Notes (Signed)
Summary: PATIENT WOULD LIKE LAB RESULTS  Phone Note Call from Patient   Caller: Patient Call For: YOO  Summary of Call: PATIENT WOULD LIKE HER LAB RESULTS  Initial call taken by: Shanon Payor,  January 03, 2010 2:40 PM  Follow-up for Phone Call        call pt - kidney function improved.  diabetes control is worse.  I will further discuss at next office visit ok to send patient copy of her lab results Follow-up by: D. Drema Pry DO,  January 03, 2010 7:20 PM  Additional Follow-up for Phone Call Additional follow up Details #1::        Pt has been notified and labs have been mailed. Gilmore Laroche Fergerson CMA Deborra Medina)  January 04, 2010 8:47 AM

## 2010-02-22 NOTE — Assessment & Plan Note (Signed)
Summary: sore throat/mhf--rm 5   Vital Signs:  Patient profile:   60 year old female Height:      64 inches Weight:      264.50 pounds BMI:     45.57 Temp:     98.0 degrees F oral Pulse rate:   84 / minute Pulse rhythm:   regular Resp:     18 per minute BP sitting:   130 / 80  (left arm) Cuff size:   large  Vitals Entered By: Kelle Darting CMA Deborra Medina) (January 23, 2010 4:07 PM) CC: Pt states she has had laryngitis since last Thursday and cough. Low grade fever since Saturday, tightness in chest and scratchy throat. Is Patient Diabetic? Yes Pain Assessment Patient in pain? no        Primary Care Provider:  Jennings Books DO  CC:  Pt states she has had laryngitis since last Thursday and cough. Low grade fever since Saturday and tightness in chest and scratchy throat.Marland Kitchen  History of Present Illness: Ms.  Thrower is a 60 year old female with cc of laryngitis x 6 days. Associated dry cough, chest fullness, low grade fever started on Saturday AM 100.2,  has been using continuous tylenol to keep down the temperature. Denies associated nasal congestion,  but complains of frontal and maxillary sinus pressure and notes that when she lays down she has a great deal of phlegm dripping down the back of her throat.     Problems Prior to Update: 1)  Sinusitis  (ICD-473.9) 2)  Abdominal Pain, Right Upper Quadrant  (ICD-789.01) 3)  Uri  (ICD-465.9) 4)  Fatigue  (ICD-780.79) 5)  Renal Insufficiency  (ICD-588.9) 6)  Uti  (ICD-599.0) 7)  Hematuria Unspecified  (ICD-599.70) 8)  Hypertension  (ICD-401.9) 9)  Osteoporosis  (ICD-733.00) 10)  Routine General Medical Exam@health  Care Fac[facility]  (ICD-V70.0) 11)  Asymptomatic Postmenopausal Status  (ICD-V49.81) 12)  Back Pain, Lumbar  (ICD-724.2) 13)  Family History Diabetes 1st Degree Relative  (ICD-V18.0) 14)  Anemia-iron Deficiency  (ICD-280.9) 15)  Diverticulosis, Colon  (ICD-562.10) 16)  Ibs  (ICD-564.1) 17)  Allergic Rhinitis  (ICD-477.9) 18)   Gerd  (ICD-530.81) 19)  Depression  (ICD-311) 20)  Anxiety  (ICD-300.00) 21)  Hyperlipidemia  (ICD-272.4) 22)  Diabetes Mellitus, Type II  (ICD-250.00) 23)  Anemia, Pernicious  (ICD-281.0)  Allergies: 1)  ! Codeine 2)  ! Demerol 3)  ! * Advicor  Review of Systems       see HPI  Physical Exam  General:  Morbidly obese white female, awake, alert.  Appears tired.  Voice is hoarse. Head:  Normocephalic and atraumatic without obvious abnormalities. + frontal sinus pressure to palpation Eyes:  PERRLA, sclera are clear Ears:  External ear exam shows no significant lesions or deformities.  Otoscopic examination reveals clear canals, tympanic membranes are intact bilaterally without bulging, retraction, inflammation or discharge. Hearing is grossly normal bilaterally. Mouth:  Oral mucosa and oropharynx without lesions or exudates.  Teeth in good repair. Neck:  No deformities, masses, or tenderness noted. Lungs:  Normal respiratory effort, chest expands symmetrically. Lungs are clear to auscultation, no crackles or wheezes. Heart:  Normal rate and regular rhythm. S1 and S2 normal without gallop, murmur, click, rub or other extra sounds.   Impression & Recommendations:  Problem # 1:  SINUSITIS ([id number]) Assessment New Suspect symptoms were initially viral in nature, but have progressed to an acute sinusitus.  Plan treatment with ceftin.  Pt instructed to call for follow  up as noted in pt. sign out.  Her updated medication list for this problem includes:    Ceftin 500 Mg Tabs (Cefuroxime axetil) ..... One tablet by mouth two times a day for 10 days  Orders: Prescription Created Electronically [phone])  Complete Medication List: 1)  Paroxetine Hcl 30 Mg Tabs (Paroxetine hcl) .... One by mouth two times a day 2)  Alprazolam 1 Mg Tabs (Alprazolam) .... Take 1 tablet by mouth two times a day 3)  Lipitor 40 Mg Tabs (Atorvastatin calcium) .Marland Kitchen.. 1 by mouth once daily 4)  Vitamin B-12 Cr 1000  Mcg Tbcr (Cyanocobalamin) .... Inject q month 5)  Labetalol Hcl 200 Mg Tabs (Labetalol hcl) .... One by mouth two times a day 6)  Mysoline 250 Mg Tabs (Primidone) .Marland Kitchen.. 1 tablet by mouth  every morning and three tablets by mouth at bedtime 7)  Amlodipine Besylate 5 Mg Tabs (Amlodipine besylate) .... One by mouth once daily 8)  Mirtazapine 15 Mg Tabs (Mirtazapine) .... 1/2 at bedtime 9)  Calcium 600/vitamin D 600-400 Mg-unit Tabs (Calcium carbonate-vitamin d) .... Take 1 tablet by mouth once daily 10)  Premarin 0.9 Mg Tabs (Estrogens conjugated) .... Take 1 tablet by mouth every morning 11)  Fish Oil 1000 Mg Caps (Omega-3 fatty acids) .... Take 2 tablets by mouth twice daily. 12)  Furosemide 20 Mg Tabs (Furosemide) .... Take 1 tablet by mouth two times a day 13)  Ferrous Sulfate 325 (65 Fe) Mg Tabs (Ferrous sulfate) .... Take 1 tablet by mouth once a day 14)  Lantus Solostar 100 Unit/ml Soln (Insulin glargine) .... 50 units q pm 15)  Humalog Kwikpen 100 Unit/ml Soln (Insulin lispro (human)) .... 20 - 25  units subcutaneously before breakfast and dinner 16)  Freestyle Lite Test Strp (Glucose blood) .... Use six times a day 17)  Freestyle Lancets Misc (Lancets) .... Use six times a day 18)  Bd Pen Needle Ultrafine 29g X 12.58mm Misc (Insulin pen needle) .... Use three times a day as directed 19)  Tradjenta 5 Mg Tabs (Linagliptin) .... One by mouth qd 20)  Freestyle Freedom Lite W/device Kit (Blood glucose monitoring suppl) .... Use to check blood sugar three times a day 250.00 21)  Ceftin 500 Mg Tabs (Cefuroxime axetil) .... One tablet by mouth two times a day for 10 days  Patient Instructions: 1)  Call if you develop fever over 101, if your symptoms worsen, or if your symptoms are not improved in 48-72 hours.   2)  Follow up with Dr. Shawna Orleans in March as scheduled. Prescriptions: CEFTIN 500 MG TABS (CEFUROXIME AXETIL) one tablet by mouth two times a day for 10 days  #20 x 0   Entered and Authorized  by:   Nance Pear FNP   Signed by:   Nance Pear FNP on 01/23/2010   Method used:   Electronically to        [facility] (retail)       [address]       [place], [place]  [address]       Ph: [phone]       Fax: [phone]   RxID:   [id number]    Orders Added: 1)  Prescription Created Electronically [id number] 2)  Est. Patient Level III [id number]    Current Allergies (reviewed today): ! CODEINE ! DEMEROL ! * ADVICOR

## 2010-02-28 NOTE — Assessment & Plan Note (Signed)
Summary: Evaluation of Foot   Vital Signs:  Patient profile:   60 year old female Height:      64 inches Weight:      263 pounds BMI:     45.31 O2 Sat:      97 % on Room air Temp:     98.2 degrees F oral Pulse rate:   82 / minute Resp:     20 per minute BP sitting:   150 / 80  (right arm) Cuff size:   large  Vitals Entered By: Jiles Garter CMA (February 13, 2010 3:30 PM)  O2 Flow:  Room air CC: sore on left foot Is Patient Diabetic? Yes Pain Assessment Patient in pain? no        Primary Care Provider:  Jennings Books DO  CC:  sore on left foot.  History of Present Illness: noticed foot pain on Thurs more walking than usual  she thought she had a biister  daughter noticed sore and squeezed small red area which cause severe pain she denies fever or chills  Allergies: 1)  ! Codeine 2)  ! Demerol 3)  ! 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   Lungs:  Normal respiratory effort, chest expands symmetrically. Lungs are clear to auscultation, no crackles or wheezes. Heart:  normal rate, regular rhythm, and no gallop.   Skin:  left foot - small oblong abscess.     Impression & Recommendations:  Problem # 1:  FOOT PAIN, LEFT (ICD-729.5) 60 y/o diabetic with small abscess bottom of left foot (ball of foot) able to express some pus  I dont think I & D required soak in warm salt water two times a day take doxy  as directed reassess next week Patient advised to call office if symptoms persist or worsen.  she will eventually need referral to podiatrist for custom fit shoes to  avoid this in the future  Complete Medication List: 1)  Paroxetine Hcl 30 Mg Tabs (Paroxetine hcl) .... One by mouth two times a day 2)  Alprazolam 1 Mg Tabs (Alprazolam) .... Take 1 tablet by mouth two times a day 3)  Lipitor 40 Mg Tabs (Atorvastatin calcium) .Marland Kitchen.. 1 by mouth once daily 4)  Vitamin B-12 Cr 1000 Mcg Tbcr (Cyanocobalamin) .... Inject q month 5)  Labetalol Hcl 200 Mg Tabs (Labetalol hcl) .... One by mouth two times a day 6)  Mysoline 250 Mg Tabs (Primidone) .Marland Kitchen.. 1 tablet by mouth  every morning and three tablets by mouth at bedtime 7)  Amlodipine Besylate 5 Mg Tabs (Amlodipine besylate) .... One by mouth once daily 8)  Mirtazapine 15 Mg Tabs (Mirtazapine) .... 1/2 at bedtime 9)  Calcium 600/vitamin D 600-400 Mg-unit Tabs (Calcium carbonate-vitamin d) .... Take 1 tablet by mouth once daily 10)  Premarin 0.9 Mg Tabs (Estrogens conjugated) .... Take 1 tablet by mouth every morning 11)  Fish Oil 1000 Mg Caps (Omega-3 fatty acids) .... Take 2 tablets by mouth twice daily. 12)  Furosemide 20 Mg Tabs (Furosemide) .... Take 1 tablet by mouth two times a day 13)  Ferrous Sulfate 325 (65 Fe) Mg Tabs (Ferrous  sulfate) .... Take 1 tablet by mouth once a day 14)  Lantus Solostar 100 Unit/ml Soln (Insulin glargine) .... 50 units q pm 15)  Humalog Kwikpen 100 Unit/ml Soln (Insulin lispro (human)) .... 20 - 25  units subcutaneously before breakfast and dinner 16)  Freestyle Lite Test Strp (Glucose blood) .... Use six times a day 17)  Freestyle Lancets Misc (Lancets) .... Use six times a day 18)  Bd Pen Needle Ultrafine 29g X 12.6mm Misc (Insulin pen needle) .... Use three times a day as directed 19)  Tradjenta 5 Mg Tabs (Linagliptin) .... One by mouth qd 20)  Freestyle Freedom Lite W/device Kit (Blood glucose monitoring suppl) .... Use to check blood sugar three times a day 250.00 21)  Doxycycline Hyclate 100 Mg Tabs (Doxycycline hyclate) .... One by mouth two times a day  Other Orders: Vit  B12 1000 mcg (J3420) Admin of Therapeutic Inj  intramuscular or subcutaneous [id number])  Patient Instructions: 1)  Please schedule a follow-up appointment in 1 week 2)  Call our office if your symptoms do not  improve or gets worse. Prescriptions: DOXYCYCLINE HYCLATE 100 MG TABS (DOXYCYCLINE HYCLATE) one by mouth two times a day  #14 x 0   Entered and Authorized by:   D. Drema Pry DO   Signed by:   D. Drema Pry DO on 02/13/2010   Method used:   Electronically to        [facility] (retail)       [address]       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 Patient Level III [id number]   Current Allergies (reviewed today): ! CODEINE ! DEMEROL ! * ADVICOR

## 2010-02-28 NOTE — Assessment & Plan Note (Signed)
Summary: 1 week follow up/mhf   Vital Signs:  Patient profile:   60 year old female Height:      64 inches O2 Sat:      100 % on Room air Temp:     98.4 degrees F oral Pulse rate:   77 / minute Resp:     18 per minute BP sitting:   136 / 80  (right arm) Cuff size:   large  Vitals Entered By: Jiles Garter CMA (February 20, 2010 3:02 PM)  O2 Flow:  Room air CC: 1 Week Follow up  Is Patient Diabetic? Yes Pain Assessment Patient in pain? no      Comments follow up on foot, improved, one day remaining on antibiotic   Primary Care Provider:  Jennings Books DO  CC:  1 Week Follow up .  History of Present Illness: 60 y/o female for f/u re:  small left foot abscess symptoms much improved with soak in salt solution and taking abx no pain does not have appt yet to see podiatrist  Preventive Screening-Counseling & Management  Alcohol-Tobacco     Smoking Status: never  Allergies: 1)  ! Codeine 2)  ! Demerol 3)  ! 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   Lungs:  Normal respiratory effort, chest expands symmetrically. Lungs are clear to auscultation, no crackles or wheezes. Heart:  Normal rate and regular rhythm. S1 and S2 normal without gallop, murmur, click, rub or other extra sounds. Skin:  left foot - no redness,  no tenderness   Impression &  Recommendations:  Problem # 1:  FOOT PAIN, LEFT (ICD-729.5) Assessment Improved  small abscess resolved pt currently wearing clogs pt would benefit from podiatrist eval re:  orthodics and diabetic shoes  Orders: Podiatry Referral (Podiatry)  Complete Medication List: 1)  Paroxetine Hcl 30 Mg Tabs (Paroxetine hcl) .... One by mouth two times a day 2)  Alprazolam 1 Mg Tabs (Alprazolam) .... Take 1 tablet by mouth two times a day 3)  Lipitor 40 Mg Tabs (Atorvastatin calcium) .Marland Kitchen.. 1 by mouth once daily 4)  Vitamin B-12 Cr 1000 Mcg Tbcr (Cyanocobalamin) .... Inject q month 5)  Labetalol Hcl 200 Mg Tabs (Labetalol hcl) .... One by mouth two times a day 6)  Mysoline 250 Mg Tabs (Primidone) .Marland Kitchen.. 1 tablet by mouth  every morning and three tablets by mouth at bedtime 7)  Amlodipine Besylate 5 Mg Tabs (Amlodipine besylate) .... One by mouth once daily 8)  Mirtazapine 15 Mg Tabs (Mirtazapine) .... 1/2 at bedtime 9)  Calcium 600/vitamin D 600-400 Mg-unit Tabs (Calcium carbonate-vitamin d) .... Take 1 tablet by mouth once daily 10)  Premarin 0.9 Mg Tabs (Estrogens conjugated) .... Take 1 tablet by mouth every morning 11)  Fish Oil 1000 Mg Caps (Omega-3 fatty acids) .... Take 2 tablets by mouth twice daily. 12)  Furosemide 20 Mg Tabs (Furosemide) .... Take 1 tablet by mouth two times a day 13)  Ferrous Sulfate 325 (65 Fe) Mg Tabs (Ferrous sulfate) .... Take 1 tablet by mouth once a day 14)  Lantus Solostar 100 Unit/ml Soln (Insulin glargine) .... 50 units q pm 15)  Humalog Kwikpen 100 Unit/ml Soln (Insulin lispro (human)) .... 20 - 25  units subcutaneously before breakfast and dinner 16)  Freestyle Lite Test Strp (Glucose blood) .... Use six times a day 17)  Freestyle Lancets Misc (Lancets) .... Use six times a day 18)  Bd Pen Needle Ultrafine 29g X 12.45mm Misc (Insulin pen needle) .... Use three times a day as directed 19)  Tradjenta 5 Mg Tabs (Linagliptin) .... One by mouth qd 20)  Freestyle  Freedom Lite W/device Kit (Blood glucose monitoring suppl) .... Use to check blood sugar three times a day 250.00 21)  Doxycycline Hyclate 100 Mg Tabs (Doxycycline hyclate) .... One by mouth two times a day   Orders Added: 1)  Podiatry Referral [Podiatry] 2)  Est. Patient Level II [id number]

## 2010-03-05 LAB — HM MAMMOGRAPHY: HM Mammogram: NORMAL

## 2010-03-15 ENCOUNTER — Ambulatory Visit (INDEPENDENT_AMBULATORY_CARE_PROVIDER_SITE_OTHER): Payer: Medicare Other

## 2010-03-15 ENCOUNTER — Encounter: Payer: Self-pay | Admitting: Internal Medicine

## 2010-03-15 DIAGNOSIS — D510 Vitamin B12 deficiency anemia due to intrinsic factor deficiency: Secondary | ICD-10-CM

## 2010-03-20 NOTE — Assessment & Plan Note (Signed)
Summary: b 12 shotmhf  Nurse Visit   Allergies: 1)  ! Codeine 2)  ! Demerol 3)  ! 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 CODEINE ! DEMEROL ! * ADVICOR

## 2010-03-29 ENCOUNTER — Telehealth: Payer: Self-pay | Admitting: Internal Medicine

## 2010-04-03 NOTE — Progress Notes (Signed)
Summary: lantus solostar sample  Phone Note Call from Patient   Caller: Patient Call For: D. Drema Pry DO Summary of Call: Pt came by office requesting samples of Lantus solostar as she states she has not received her mail order rx yet. Ok per Dr Shawna Orleans to provide pt with samples.  Pt given Lantus Solostar # 2 pens Lot [id number]  Exp 04/2012. Gilmore Laroche Fergerson CMA Deborra Medina)  March 29, 2010 1:48 PM

## 2010-04-12 ENCOUNTER — Ambulatory Visit: Payer: Self-pay | Admitting: Internal Medicine

## 2010-04-13 ENCOUNTER — Ambulatory Visit: Payer: Medicare Other

## 2010-04-13 ENCOUNTER — Ambulatory Visit (INDEPENDENT_AMBULATORY_CARE_PROVIDER_SITE_OTHER): Payer: Medicare Other | Admitting: Internal Medicine

## 2010-04-13 DIAGNOSIS — E538 Deficiency of other specified B group vitamins: Secondary | ICD-10-CM | POA: Insufficient documentation

## 2010-04-13 MED ORDER — CYANOCOBALAMIN 1000 MCG/ML IJ SOLN
1000.0000 ug | Freq: Once | INTRAMUSCULAR | Status: AC
  Administered 2010-04-13: 1000 ug via INTRAMUSCULAR

## 2010-04-16 ENCOUNTER — Other Ambulatory Visit: Payer: Self-pay | Admitting: Internal Medicine

## 2010-04-20 NOTE — Telephone Encounter (Signed)
Pt has f/u 05/14/10. Last refilled 02/02/09. Refill sent to pharmacy.

## 2010-05-01 LAB — FOLATE: Folate: 7.2 ng/mL

## 2010-05-01 LAB — LIPASE, BLOOD: Lipase: 46 U/L (ref 11–59)

## 2010-05-01 LAB — CK TOTAL AND CKMB (NOT AT ARMC)
CK, MB: 0.2 ng/mL — ABNORMAL LOW (ref 0.3–4.0)
Relative Index: INVALID (ref 0.0–2.5)

## 2010-05-01 LAB — FERRITIN: Ferritin: 67 ng/mL (ref 10–291)

## 2010-05-01 LAB — POCT I-STAT, CHEM 8
BUN: 17 mg/dL (ref 6–23)
Calcium, Ion: 1.16 mmol/L (ref 1.12–1.32)
Chloride: 101 mEq/L (ref 96–112)
Creatinine, Ser: 2 mg/dL — ABNORMAL HIGH (ref 0.4–1.2)

## 2010-05-01 LAB — GLUCOSE, CAPILLARY
Glucose-Capillary: 109 mg/dL — ABNORMAL HIGH (ref 70–99)
Glucose-Capillary: 112 mg/dL — ABNORMAL HIGH (ref 70–99)
Glucose-Capillary: 113 mg/dL — ABNORMAL HIGH (ref 70–99)
Glucose-Capillary: 121 mg/dL — ABNORMAL HIGH (ref 70–99)
Glucose-Capillary: 123 mg/dL — ABNORMAL HIGH (ref 70–99)
Glucose-Capillary: 123 mg/dL — ABNORMAL HIGH (ref 70–99)
Glucose-Capillary: 125 mg/dL — ABNORMAL HIGH (ref 70–99)
Glucose-Capillary: 126 mg/dL — ABNORMAL HIGH (ref 70–99)
Glucose-Capillary: 126 mg/dL — ABNORMAL HIGH (ref 70–99)
Glucose-Capillary: 137 mg/dL — ABNORMAL HIGH (ref 70–99)
Glucose-Capillary: 146 mg/dL — ABNORMAL HIGH (ref 70–99)
Glucose-Capillary: 147 mg/dL — ABNORMAL HIGH (ref 70–99)
Glucose-Capillary: 156 mg/dL — ABNORMAL HIGH (ref 70–99)
Glucose-Capillary: 93 mg/dL (ref 70–99)

## 2010-05-01 LAB — BASIC METABOLIC PANEL
CO2: 25 mEq/L (ref 19–32)
CO2: 27 mEq/L (ref 19–32)
Calcium: 9.3 mg/dL (ref 8.4–10.5)
Creatinine, Ser: 1.96 mg/dL — ABNORMAL HIGH (ref 0.4–1.2)
GFR calc Af Amer: 31 mL/min — ABNORMAL LOW (ref >60)
GFR calc Af Amer: 32 mL/min — ABNORMAL LOW (ref >60)
GFR calc non Af Amer: 26 mL/min — ABNORMAL LOW (ref >60)
Glucose, Bld: 137 mg/dL — ABNORMAL HIGH (ref 70–99)
Glucose, Bld: 138 mg/dL — ABNORMAL HIGH (ref 70–99)
Potassium: 4.1 mEq/L (ref 3.5–5.1)
Sodium: 143 mEq/L (ref 135–145)

## 2010-05-01 LAB — URINALYSIS, MICROSCOPIC ONLY
Bilirubin Urine: NEGATIVE
Glucose, UA: NEGATIVE mg/dL
Hgb urine dipstick: NEGATIVE
Specific Gravity, Urine: 1.012 (ref 1.005–1.030)
pH: 6 (ref 5.0–8.0)

## 2010-05-01 LAB — CARDIAC PANEL(CRET KIN+CKTOT+MB+TROPI)
CK, MB: 0.3 ng/mL (ref 0.3–4.0)
Relative Index: INVALID (ref 0.0–2.5)
Total CK: 27 U/L (ref 7–177)
Total CK: 31 U/L (ref 7–177)
Troponin I: 0.02 ng/mL (ref 0.00–0.06)

## 2010-05-01 LAB — COMPREHENSIVE METABOLIC PANEL
ALT: 29 U/L (ref 0–35)
AST: 16 U/L (ref 0–37)
Albumin: 2.9 g/dL — ABNORMAL LOW (ref 3.5–5.2)
Albumin: 2.9 g/dL — ABNORMAL LOW (ref 3.5–5.2)
Alkaline Phosphatase: 110 U/L (ref 39–117)
CO2: 27 mEq/L (ref 19–32)
Calcium: 8.9 mg/dL (ref 8.4–10.5)
Calcium: 8.9 mg/dL (ref 8.4–10.5)
Creatinine, Ser: 2 mg/dL — ABNORMAL HIGH (ref 0.4–1.2)
GFR calc Af Amer: 31 mL/min — ABNORMAL LOW (ref >60)
GFR calc Af Amer: 34 mL/min — ABNORMAL LOW (ref >60)
GFR calc non Af Amer: 26 mL/min — ABNORMAL LOW (ref >60)
Glucose, Bld: 151 mg/dL — ABNORMAL HIGH (ref 70–99)
Potassium: 4.9 mEq/L (ref 3.5–5.1)
Sodium: 135 mEq/L (ref 135–145)
Total Protein: 6.8 g/dL (ref 6.0–8.3)

## 2010-05-01 LAB — HEMOGLOBIN A1C: Hgb A1c MFr Bld: 6.6 % — ABNORMAL HIGH (ref 4.6–6.1)

## 2010-05-01 LAB — URINE CULTURE: Colony Count: 10000

## 2010-05-01 LAB — CBC
HCT: 24.9 % — ABNORMAL LOW (ref 36.0–46.0)
Hemoglobin: 8.4 g/dL — ABNORMAL LOW (ref 12.0–15.0)
MCHC: 33.5 g/dL (ref 30.0–36.0)
MCHC: 33.8 g/dL (ref 30.0–36.0)
Platelets: 339 10*3/uL (ref 150–400)
Platelets: 379 10*3/uL (ref 150–400)
RBC: 2.83 MIL/uL — ABNORMAL LOW (ref 3.87–5.11)
RDW: 14.8 % (ref 11.5–15.5)
RDW: 15 % (ref 11.5–15.5)
WBC: 10.8 10*3/uL — ABNORMAL HIGH (ref 4.0–10.5)

## 2010-05-01 LAB — IRON AND TIBC
Saturation Ratios: 20 % (ref 20–55)
TIBC: 242 ug/dL — ABNORMAL LOW (ref 250–470)

## 2010-05-01 LAB — LIPID PANEL
HDL: 29 mg/dL — ABNORMAL LOW (ref >39)
Total CHOL/HDL Ratio: 4.3 RATIO
Triglycerides: 191 mg/dL — ABNORMAL HIGH (ref <150)
VLDL: 38 mg/dL (ref 0–40)

## 2010-05-01 LAB — URINALYSIS, ROUTINE W REFLEX MICROSCOPIC
Bilirubin Urine: NEGATIVE
Ketones, ur: NEGATIVE mg/dL
Nitrite: NEGATIVE
Specific Gravity, Urine: 1.008 (ref 1.005–1.030)
Urobilinogen, UA: 0.2 mg/dL (ref 0.0–1.0)

## 2010-05-01 LAB — RETICULOCYTES
RBC.: 2.89 MIL/uL — ABNORMAL LOW (ref 3.87–5.11)
Retic Count, Absolute: 49.1 10*3/uL (ref 19.0–186.0)

## 2010-05-01 LAB — BRAIN NATRIURETIC PEPTIDE: Pro B Natriuretic peptide (BNP): 99 pg/mL (ref 0.0–100.0)

## 2010-05-01 LAB — DIFFERENTIAL
Eosinophils Relative: 1 % (ref 0–5)
Lymphocytes Relative: 31 % (ref 12–46)
Lymphs Abs: 3.3 10*3/uL (ref 0.7–4.0)
Neutro Abs: 6.6 10*3/uL (ref 1.7–7.7)

## 2010-05-01 LAB — PROTIME-INR: Prothrombin Time: 14.1 seconds (ref 11.6–15.2)

## 2010-05-01 LAB — POCT CARDIAC MARKERS: Troponin i, poc: <0.05 ng/mL (ref 0.00–0.09)

## 2010-05-01 LAB — APTT: aPTT: 33 seconds (ref 24–37)

## 2010-05-01 LAB — TROPONIN I: Troponin I: 0.05 ng/mL (ref 0.00–0.06)

## 2010-05-01 LAB — MAGNESIUM: Magnesium: 1.7 mg/dL (ref 1.5–2.5)

## 2010-05-10 ENCOUNTER — Other Ambulatory Visit: Payer: Self-pay | Admitting: *Deleted

## 2010-05-10 DIAGNOSIS — E119 Type 2 diabetes mellitus without complications: Secondary | ICD-10-CM

## 2010-05-10 NOTE — Telephone Encounter (Signed)
Fax received from [facility] for Humalog Kwik Pen, Pen needles and Furosemide

## 2010-05-11 MED ORDER — INSULIN LISPRO 100 UNIT/ML ~~LOC~~ SOLN: 20.0000 [IU] | Freq: Two times a day (BID) | SUBCUTANEOUS | Status: DC

## 2010-05-11 NOTE — Telephone Encounter (Signed)
Patient called and left voice message stating she is no longer using [facility] for her mail order pharmacy and request refill to the [facility] on file for Humalog. She states she is scheduled to follow up with Dr Shawna Orleans on Monday and she will have her other Rx's addressed at that time

## 2010-05-14 ENCOUNTER — Telehealth: Payer: Self-pay | Admitting: Internal Medicine

## 2010-05-14 ENCOUNTER — Ambulatory Visit: Payer: Medicare Other | Admitting: Internal Medicine

## 2010-05-14 MED ORDER — AMLODIPINE BESYLATE 5 MG PO TABS: 5.0000 mg | ORAL_TABLET | Freq: Every day | ORAL | Status: DC

## 2010-05-14 NOTE — Telephone Encounter (Signed)
Left message for pt to return my call re: refill to [facility]. Need phone number and fax number as company is not in our database.

## 2010-05-14 NOTE — Telephone Encounter (Signed)
She has new insurance she has to use [facility]  [address] [place] [id number].  THESE PEOPLE WERE TO HAVE SENT AN RX REQUEST TO DR [place] ASKING FOR AN RX ON THE AMLODEPINE  SHE IS OUT SHE NEEDS AND EMERGENCY RX CALLED IN TO [facility] [address] [place].

## 2010-05-15 ENCOUNTER — Encounter: Payer: Self-pay | Admitting: *Deleted

## 2010-05-15 NOTE — Telephone Encounter (Signed)
Refill request received From [facility]  Fax [phone]- Pharmacist Carmela Hurt for Amlodipine and Labetalol

## 2010-05-15 NOTE — Telephone Encounter (Signed)
This encounter was created in error - please disregard.

## 2010-05-15 NOTE — Telephone Encounter (Signed)
Additional refill received for BD Ultrafine pen needle 29g x 12.7 cm

## 2010-05-16 NOTE — Telephone Encounter (Signed)
Medication refills to be addressed on patients office visit on Friday 05/18/2010 per patients previous message to hold off on refill

## 2010-05-16 NOTE — Telephone Encounter (Signed)
Medication refills to be addressed at patients office visit on

## 2010-05-18 ENCOUNTER — Encounter: Payer: Self-pay | Admitting: Internal Medicine

## 2010-05-18 ENCOUNTER — Ambulatory Visit (INDEPENDENT_AMBULATORY_CARE_PROVIDER_SITE_OTHER): Payer: Medicare Other | Admitting: Internal Medicine

## 2010-05-18 DIAGNOSIS — E119 Type 2 diabetes mellitus without complications: Secondary | ICD-10-CM

## 2010-05-18 DIAGNOSIS — D510 Vitamin B12 deficiency anemia due to intrinsic factor deficiency: Secondary | ICD-10-CM

## 2010-05-18 DIAGNOSIS — I10 Essential (primary) hypertension: Secondary | ICD-10-CM

## 2010-05-18 DIAGNOSIS — N259 Disorder resulting from impaired renal tubular function, unspecified: Secondary | ICD-10-CM

## 2010-05-18 LAB — HEMOGLOBIN A1C: Mean Plasma Glucose: 243 mg/dL — ABNORMAL HIGH (ref <117)

## 2010-05-18 MED ORDER — INSULIN PEN NEEDLE 29G X 12.7MM MISC: Status: DC

## 2010-05-18 MED ORDER — LABETALOL HCL 200 MG PO TABS: 200.0000 mg | ORAL_TABLET | Freq: Two times a day (BID) | ORAL | Status: DC

## 2010-05-18 MED ORDER — FUROSEMIDE 20 MG PO TABS: 20.0000 mg | ORAL_TABLET | Freq: Two times a day (BID) | ORAL | Status: DC

## 2010-05-18 MED ORDER — CYANOCOBALAMIN 1000 MCG/ML IJ SOLN
1000.0000 ug | Freq: Once | INTRAMUSCULAR | Status: AC
  Administered 2010-05-18: 1000 ug via INTRAMUSCULAR

## 2010-05-18 NOTE — Patient Instructions (Signed)
Please complete the following lab tests before your next follow up appointment: Urine microalbumin / cr ratio

## 2010-05-19 LAB — BASIC METABOLIC PANEL WITH GFR
BUN: 31 mg/dL — ABNORMAL HIGH (ref 6–23)
CO2: 20 mEq/L (ref 19–32)
Calcium: 9.2 mg/dL (ref 8.4–10.5)
GFR, Est African American: 43 mL/min — ABNORMAL LOW (ref >60)
Glucose, Bld: 191 mg/dL — ABNORMAL HIGH (ref 70–99)

## 2010-05-21 ENCOUNTER — Telehealth: Payer: Self-pay | Admitting: Internal Medicine

## 2010-05-21 ENCOUNTER — Encounter: Payer: Self-pay | Admitting: Internal Medicine

## 2010-05-21 DIAGNOSIS — E119 Type 2 diabetes mellitus without complications: Secondary | ICD-10-CM

## 2010-05-21 DIAGNOSIS — I10 Essential (primary) hypertension: Secondary | ICD-10-CM

## 2010-05-21 DIAGNOSIS — E785 Hyperlipidemia, unspecified: Secondary | ICD-10-CM

## 2010-05-21 MED ORDER — INSULIN GLARGINE 100 UNIT/ML ~~LOC~~ SOLN: SUBCUTANEOUS | Status: DC

## 2010-05-21 MED ORDER — INSULIN LISPRO 100 UNIT/ML ~~LOC~~ SOLN: 20.0000 [IU] | Freq: Two times a day (BID) | SUBCUTANEOUS | Status: DC

## 2010-05-21 MED ORDER — ATORVASTATIN CALCIUM 40 MG PO TABS: 40.0000 mg | ORAL_TABLET | Freq: Every day | ORAL | Status: DC

## 2010-05-21 MED ORDER — AMLODIPINE BESYLATE 5 MG PO TABS: 5.0000 mg | ORAL_TABLET | Freq: Every day | ORAL | Status: DC

## 2010-05-21 MED ORDER — FREESTYLE LANCETS MISC: Status: DC

## 2010-05-21 MED ORDER — GLUCOSE BLOOD VI STRP: ORAL_STRIP | Status: DC

## 2010-05-21 MED ORDER — LABETALOL HCL 200 MG PO TABS: 200.0000 mg | ORAL_TABLET | Freq: Two times a day (BID) | ORAL | Status: DC

## 2010-05-21 MED ORDER — LINAGLIPTIN 5 MG PO TABS: 5.0000 mg | ORAL_TABLET | Freq: Every day | ORAL | Status: DC

## 2010-05-21 MED ORDER — INSULIN PEN NEEDLE 29G X 12.7MM MISC: Status: DC

## 2010-05-21 MED ORDER — FUROSEMIDE 20 MG PO TABS: 20.0000 mg | ORAL_TABLET | Freq: Two times a day (BID) | ORAL | Status: DC

## 2010-05-21 NOTE — Telephone Encounter (Signed)
Call placed to patient at [phone], she is requesting all of medication sent to [facility]. Patient could not provide a fax number or phone number to requested mail order pharmacy. She was informed Rx 's would be mailed to her, and she is ok with that.  She was advised if she does not receive medications, prior to local refill running out, she was to call back for additional refill. Patient verbalized understanding and agrees.  Call was returned to patient she was informed Rx's would be faxed to [facility] at [phone]

## 2010-05-21 NOTE — Telephone Encounter (Signed)
SHE DOES NOT NEED REFILLS ON LIPITOR, FUROSEMIDE, LABATELOL, AMLODIPINE, MIRTIZAPINE.   SHE NEEDS A 30 DAY SUPPLY ON EVERYTHING ELSE TO [facility].  THIS IS HER NEW RX MAIL ORDER PLAN

## 2010-05-22 ENCOUNTER — Telehealth: Payer: Self-pay | Admitting: Internal Medicine

## 2010-05-22 NOTE — Telephone Encounter (Signed)
Patients medication have been faxed to [facility]

## 2010-05-22 NOTE — Telephone Encounter (Signed)
Pt would like results of blood work.

## 2010-05-23 NOTE — Telephone Encounter (Signed)
Please mail copy of labs to pt

## 2010-05-24 NOTE — Telephone Encounter (Signed)
Copy of results mailed to patients address on file

## 2010-06-05 NOTE — H&P (Signed)
NAMEMIKESHIA, ZOTTER               ACCOUNT NO.:  [id number]   MEDICAL RECORD NO.:  [id number]          PATIENT TYPE:  INP   LOCATION:  [id number]                         FACILITY:  [facility]   PHYSICIAN:  Farris Has, MDDATE OF BIRTH:  1950-08-12   DATE OF ADMISSION:  05/26/2008  DATE OF DISCHARGE:                              HISTORY & PHYSICAL   PRIMARY CARE PHYSICIAN:  Sean A. Loanne Drilling, MD of [facility].   CHIEF COMPLAINT:  Shortness of breath and chest pain.   HISTORY OF PRESENT ILLNESS:  The patient is a 60 year old female with  past medical history significant for diabetes and hypertension as well  as anxiety and essentially tremor.  The patient states that she had been  feeling not well since some time in April when she had an abdominal  pain.  She went to the emergency department and was found to have  elevated creatinine, felt to be dehydrated.  She went home and followed  with her primary care physician who told her to hold off on her Lasix  which she was taking before for increased fluids secondary to my other  blood pressure medications.  She was then continued on Lasix for quite  some time and was actually feeling not so bad.  This Saturday, she  developed fever up to 102 and chills, no cough.  She presented to her  primary care Shila Kruczek, said she was feeling somewhat short of breath.  She got a chest x-ray on May 4.  This showed questionable mild  cardiomegaly and edema.  Her Lasix was restarted.  Her Actos was  stopped.  They also told her she may have heart failure and scheduled  her for an echo as an outpatient.  Her fever and chills have resolved,  but she continued to feel quite poorly, and today this morning, she  started having chest pain that started at rest and continued all day  long, steadily, and felt like a chest pressure and did not get better,  worse with activities.  Did not have any nausea or vomiting.  No  diaphoresis associated with this, just generalized  weakness.  The  patient called Dr. Loanne Drilling and was told to come to the emergency  department.  In the ED, first set of cardiac markers were negative.  EKG  did not show any changes consistent with ischemic changes.  Her BMP was  normal.  Chest x-ray did not show any evidence of heart failure.  Denied  any evidence of cardiomegaly, at which point hospitalists were called  for an admission.   REVIEW OF SYSTEMS:  Currently, her fevers have resolved.  No nausea,  vomiting, diarrhea.  No abdominal pain.  Her chest pain is improved  after aspirin.  She overall feels slightly better now.  Otherwise,  review of systems unremarkable except for anxiety per her family and  herself.   PAST MEDICAL HISTORY:  1. History of anxiety.  2. Essentially tremors.  3. Diabetes.  4. Hypertension.  5. Questionable history of heart failure, recently diagnosed and no      echocardiogram yet.  SOCIAL HISTORY:  The patient does not smoke, drink abuse drugs.   FAMILY HISTORY:  Father had coronary artery disease in his family.  Sister supposedly had a heart attack when she was 34, but also a stroke,  but she was a heavy smoker.  Mother had a heart disease in her 75s.   ALLERGIES:  CODEINE AND DEMEROL.   MEDICATIONS:  1. Mirtazapine half tablet, 7.5/50 mg q.h.s.  2. Primidone 50 mg every morning and 150 mg at bedtime.  3. Lipitor 80.  4. Bupropion 300 mg daily.  5. Calcium.  6. Fish oil.  7. Alprazolam 1 mg t.i.d.  8. Lasix 80 mg daily, recently restarted.  9. Enalapril 20 mg twice a day.  10.Paroxetine 30 mg twice a day.  11.Premarin 0.9 mg daily.  12.Prandin 2 mg t.i.d.  13.Benzonatate 100 mg as needed.  14.Metoprolol 50 mg daily.   PHYSICAL EXAMINATION:  VITAL SIGNS:  Temperature 98.3, blood pressure  140/68, pulse 86, respirations 16, saturation 95% on room air.  The  patient appears to be in no acute distress.  HEENT:  Head nontraumatic.  Somewhat dry mucous membranes, but normal  skin  turgor.  HEART:  Regular rate and rhythm.  No murmurs, rubs or gallops.  LUNGS:  Clear to auscultation bilaterally.  No crackles or wheezes could  be appreciated.  ABDOMEN:  Obese but nontender, nondistended.  EXTREMITIES:  Lower extremities obese but no edema present.  NEUROLOGICAL:  The patient appears to be intact.  Alert, somewhat  distressed.  Speaks in a soft, quiet voice.   STUDIES:  White blood cell count 10.6, hemoglobin 10.2.  Sodium 141,  potassium 3.7, creatinine 2.0.  Of note, it has been steady around 2 to  2.5 for the past month.  Blood sugar 79, up from 62.  BNP 99, down from  200 two days ago.  UA showing 3-6 white blood cells and few bacteria.  HEENT showing heart rate of 88, sinus rhythm no ischemic changes.  Cardiac markers negative.  Chest x-ray on May 4 showed mild CHF, but  chest x-ray today showed just low lung volumes, leaky skin and a for  sure probability for PE.   ASSESSMENT/PLAN:  This is a 60 year old female with chest pain, somewhat  atypical and shortness of breath with history of heart failure and renal  insufficiency that has been there for about a month.  1. Chest pain.  Given family history and history of diabetes, will      cycle cardiac enzymes.  Obtain serial EKG. Check fasting lipid      panel.  Admit to telemetry.  Wonder if the patient should have at      some point a stress test done, perhaps and is inpatient to help [place]      decide if she has any risks for coronary artery disease.  2. Questionable heart failure.  Will obtain 2D echo for BNP, for renal      function, for orthostatics and weight.  3. Renal insufficiency.  I am not sure if the patient has just been      over diuresed or if she has developed renal insufficiency secondary      to diabetes.  Will give gentle IV fluids and follow.  Check renal      ultrasound and urine lytes.  If there is any renal disease, wonder      if the patient should be followed up with Nephrology.  Will  check  ANA.  4. Urinary tract infection, mild.  Will cover with Rocephin.  5. Hypertension.  Hold ACE inhibitor as the patient is in renal      insufficiency.  Continue with Metoprolol.  6. Diabetes.  The patient had some episodes of hypoglycemia and will      hold p.o. medications.  Will do sliding scale for now and check for      sugars every four hours and follow.  7. Anxiety.  Continue home medications.  8. Hyperlipidemia.  Continue home medications.  9. Chronic tremor.  Continue home medications.  10.Prophylaxis.  Protonix and Lovenox.      Farris Has, MD  Electronically Signed     AVD/MEDQ  D:  05/26/2008  T:  05/27/2008  Job:  [id number]   cc:   Hilliard Clark A. Loanne Drilling, MD

## 2010-06-05 NOTE — Discharge Summary (Signed)
[name], [name]               ACCOUNT NO.:  [id number]   MEDICAL RECORD NO.:  [id number]          PATIENT TYPE:  INP   LOCATION:  4736                         FACILITY:  [facility]   PHYSICIAN:  Sandy Salaam. Shawna Orleans, DO      DATE OF BIRTH:  [birth year]   DATE OF ADMISSION:  05/26/2008  DATE OF DISCHARGE:  06/01/2008                               DISCHARGE SUMMARY   DISCHARGE DIAGNOSES:  1. Shortness of breath secondary to Actos.  2. Renal insufficiency.  3. Hypertension.  4. Type 2 diabetes.  5. Sinusitis.  6. History of depression.  7. Essential tremor.  8. Anxiety.   DISCHARGE MEDICATIONS:  1. Labetalol 200 mg twice daily.  2. Amlodipine 5 mg once daily.  3. Lipitor 80 mg one half tablet at bedtime.  4. Primidone 50 mg in a.m. and 150 mg at bedtime.  5. Paxil 30 mg twice daily.  6. Prandin 2 mg twice daily before meals.  7. Remeron 15 mg one half at bedtime.  8. Alprazolam 1 mg three times a day as needed.  9. The patient advised to discontinue Actos and metformin.  10.The patient advised to discontinue Wellbutrin.   DIET INSTRUCTIONS:  Low carb/carb modified diet.   FOLLOWUP INSTRUCTIONS:  Follow up with Dr. Shawna Orleans at [facility] within 1-2 weeks.  Follow up with [facility], outpatient appointment to be  arranged.  Follow up with psychiatrist, Dr. Erling Cruz, Jun 14, 2008 at 2 p.m.  Follow up with counselor, Natale Lay, on June 21, 2008 at 2 p.m.   HOSPITAL COURSE:  The patient is a 60 year old white female with past  medical history of diabetes, hypertension, and essential tremor who  presented with weakness and abdominal pain.  The patient seen at [facility] ER and found to have elevated creatinine.  The patient also felt  short of breath.  There was question of cardiomegaly on chest x-ray and  question pulmonary edema.  The patient admitted for further workup.  Her  IV Lasix was discontinued.  A chest x-ray on admission did not show any  airspace  disease and no evidence of heart failure.  Echocardiogram was  performed on May 27, 2008.  This showed normal ejection fraction 55-65%,  mild MVR, mild LVH.  The patient was afebrile during hospitalization.  It was felt she may have sinusitis and she was treated with  azithromycin.  Her shortness of breath significantly improved.  She was  asymptomatic and saturating 96% on room air on discharge.  Before  hospitalization, a V/Q scan was performed which was low probability for  pulmonary embolism.   Renal insufficiency.  This was felt secondary to her taking 80 mg of  Lasix and ACE inhibitors.  Renal ultrasound was performed that showed  normal kidneys.  There was no evidence of hydronephrosis.  Creatinine  improved from 2.5 at admission to 1.96 on discharge.  The patient was  given 1 liter of normal saline before discharge.  We will arrange  Nephrology consultation as outpatient.  The patient advised to avoid any  nephrotoxic agent such as nonsteroidal anti-inflammatories and avoid  dehydration.  She will discontinue Lasix and discontinue metformin.   Hypertension.  She was tried on hydralazine, but experienced significant  nausea.  This was discontinued.  Her metoprolol was changed to labetalol  and amlodipine 5 mg was added.  The patient is to monitor for lower  extremity edema.   Type 2 diabetes.  Actos may have contributed to shortness of breath.  This will be discontinued.  She will continue Prandin for now.  She is  to monitor blood sugars at home.  If blood sugar rises, we will be  discuss starting Lantus as outpatient.   DATA:  Basic metabolic profile showed sodium 141, potassium 4.9,  chloride 107, CO2 25, glucose 137, BUN 21, creatinine 1.96, calcium 9.3.  CBC on May 30, 2008; WBC 10.3, H and H of 9.6 and 28.4, platelet count  of 378.  Urinalysis was pending at time of this dictation.  A1c on May 28, 1998, 6.6.  Cardiac enzymes negative x3.  Total cholesterol 125,   triglyceride 191, HDL 29, LDL 58, TSH of 2.14.  ANA screen was negative.   A renal ultrasound; right kidney normal in size and appearance without  hydronephrosis.  Renal length 11.4 cm.  Left kidney normal in size and  appearance without hydronephrosis.  Renal length is 12.0 cm.   Chest x-ray; lung volumes low.  Bibasilar atelectasis is present.  No  focal airspace disease or consolidation.   V/Q scan May 25, 2008, low probability for pulmonary embolism.   CONDITION ON DISCHARGE:  The patient's respiratory status had returned  to baseline.  Her blood sugars were stable.  She was felt medically  stable for discharge.      Sandy Salaam. Shawna Orleans, DO  Electronically Signed     RDY/MEDQ  D:  06/01/2008  T:  06/01/2008  Job:  [id number]   cc:   Hilliard Clark A. Loanne Drilling, MD

## 2010-06-08 NOTE — Assessment & Plan Note (Signed)
[facility]                                 ON-CALL NOTE   NAME:Hitchner, [facility]                        MRN:          [id number]  DATE:12/29/2005                            DOB:          [birth year]    [id number]   Patient of Dr. Loanne Drilling.   The patient calling because she does not feel good and she aches all  over.  She has, otherwise, no symptoms.  Advised to call Dr. Loanne Drilling  tomorrow to be seen for evaluation.     Jeffrey A. Sherren Mocha, MD  Electronically Signed    JAT/MedQ  DD: 12/29/2005  DT: 12/29/2005  Job #: [id number]

## 2010-06-08 NOTE — Consult Note (Signed)
. [facility]  Patient:    [name], [name]                      MRN: [id number] Proc. Date: 12/14/99 Adm. Date:  [id number] Attending:  Prentiss Bells                          Consultation Report  REFERRING PHYSICIAN:  Gloris Ham, M.D. [facility]  CONSULTANT:  Jefry H. Constance Holster, M.D.  REASON FOR CONSULTATION:  Swollen neck.  HISTORY:  This is a 60 year old lady with a 2-day history of swelling below the left side of the jaw and severe ear pain.  She has a history of hypertension and diabetes.  She denies any drainage from the ears or hearing loss.  She complains of chronic sinus pressure and congestion.  PAST MEDICAL HISTORY:  Diabetes, hypertension and obesity.  SOCIAL HISTORY:  Negative.  PHYSICAL EXAMINATION:  GENERAL:  She is a healthy appearing overweight lady in no distress.  She has no fever and vital signs are stable as listed on the ER record.  HEENT:  Examination of the ear reveals normal appearing tympanic membranes, clear middle ears, and healthy appearing ear canals.  intranasal exam looks clear.  No evidence of mucosal edema or infection.  Oral cavity and pharynx: There is a left lower second molar with a missing cuffed and some purulent secretion surrounding the tooth.  She is exquisitely tender to percussion. The floor of mouth, tongue and remaining oral cavity and pharynx are clear and healthy to inspection.  Palpation of the neck reveals left submandibular and submental swelling and slight tenderness.  There is no overlying skin change or erythema.  There is no fluctuance or palpable abscess cavity.  IMPRESSION:  Probable abscessed tooth with cellulitis of the neck.  I tried to contact her dentist in [facility] and he is out of the office until Tuesday. The hospital dentist is also off until Monday because of the holiday.  RECOMMENDATIONS:  Start her on some Augmentin 875 mg b.i.d.  Recommend she contacts her dentist  first thin Tuesday morning.  If this gets any worse, she will contact me over the weekend and I will arrange for her to be evaluated by an oral surgeon.  If she gets better or slowly improves then no acute intervention will be necessary.  She understands all this and agrees.  I relayed all this information to Dr. Loanne Drilling as well.  She will follow up with me as needed. DD:  12/15/99 TD:  12/15/99 Job: [id number]

## 2010-06-12 NOTE — Assessment & Plan Note (Addendum)
Diabetes control deteriorated.  Pt having difficulty with appetite control.  Fair amt of weight gain since starting insulin therapy CRI precludes use of metformin.  Trial of tradjenta.  If no improvement, consider add victoza. Lab Results  Component Value Date   HGBA1C 10.1* 05/18/2010   Lab Results  Component Value Date   CREATININE 1.50* 05/18/2010

## 2010-06-12 NOTE — Progress Notes (Signed)
Subjective:    Patient ID: [name], female    DOB: 07/30/1950, 60 y.o.   MRN: [id number]  Diabetes She presents for her follow-up diabetic visit. She has type 2 diabetes mellitus. Her disease course has been worsening. There are no hypoglycemic associated symptoms. Pertinent negatives for diabetes include no blurred vision and no chest pain. There are no hypoglycemic complications. Diabetic complications include nephropathy. Risk factors for coronary artery disease include hypertension, diabetes mellitus and obesity. Current diabetic treatment includes insulin injections. She is following a generally healthy diet. An ACE inhibitor/angiotensin II receptor blocker is contraindicated. Eye exam is current.  Hyperlipidemia Pertinent negatives include no chest pain.  Hypertension Pertinent negatives include no blurred vision or chest pain.   Pt notes increase in family stress.  Less compliant with diabetic diet   Review of Systems  Eyes: Negative for blurred vision.  Cardiovascular: Negative for chest pain.    Past Medical History  Diagnosis Date  . Pernicious anemia   . Diabetes mellitus, type 2   . Hyperlipemia   . Anxiety   . Depression   . GERD (gastroesophageal reflux disease)   . Allergic rhinitis   . IBS (irritable bowel syndrome)   . Diverticulosis of colon   . Iron deficiency anemia   . Benign essential tremor   . CRI (chronic renal insufficiency)     History   Social History  . Marital Status: Married    Spouse Name: N/A    Number of Children: N/A  . Years of Education: N/A   Occupational History  . Not on file.   Social History Main Topics  . Smoking status: Never Smoker   . Smokeless tobacco: Not on file  . Alcohol Use: No  . Drug Use:   . Sexually Active:    Other Topics Concern  . Not on file   Social History Narrative  . No narrative on file    Past Surgical History  Procedure Date  . Abdominal hysterectomy   . Cesarean section   .  Tonsillectomy     Family History  Problem Relation Age of Onset  . Heart disease    . Diabetes    . Colitis      ulcerative    Allergies  Allergen Reactions  . Advicor   . Codeine   . Meperidine Hcl     Current Outpatient Prescriptions on File Prior to Visit  Medication Sig Dispense Refill  . ALPRAZolam (XANAX) 1 MG tablet Take 1 mg by mouth 2 (two) times daily.        . Calcium Carbonate-Vitamin D (CALCIUM 600+D) 600-400 MG-UNIT per tablet Take 1 tablet by mouth daily.        . cyanocobalamin 1000 MCG/ML injection Inject into the muscle every 30 (thirty) days.        Marland Kitchen estrogens, conjugated, (PREMARIN) 0.9 MG tablet Take 0.9 mg by mouth every morning.        . ferrous sulfate 325 (65 FE) MG tablet Take 325 mg by mouth daily.        . mirtazapine (REMERON) 15 MG tablet Take 7.5 mg by mouth. 1/2 tablet by mouth at beditme       . Omega-3 Fatty Acids (FISH OIL) 1000 MG CAPS Take 1,000 mg by mouth daily.        Marland Kitchen PARoxetine (PAXIL) 30 MG tablet Take 30 mg by mouth 2 (two) times daily.        . primidone (MYSOLINE) 250  MG tablet Take 250 mg by mouth. 1 tablet by mouth every morning and 3 tablets by mouth at bedtime         BP 170/100  Pulse 91  Temp(Src) 98.2 F (36.8 C) (Oral)  Ht 5\' 4"  (1.626 m)  Wt 263 lb (119.296 kg)  BMI 45.14 kg/m2  SpO2 98%       Objective:   Physical Exam  HENT:  Head: Normocephalic and atraumatic.  Cardiovascular: Normal rate and regular rhythm.   No murmur heard. Pulmonary/Chest: Effort normal and breath sounds normal. She has no wheezes. She has no rales.  Skin: Skin is warm and dry.  Psychiatric: She has a normal mood and affect. Her behavior is normal.          Assessment & Plan:

## 2010-06-19 ENCOUNTER — Ambulatory Visit: Payer: Medicare Other

## 2010-06-20 ENCOUNTER — Ambulatory Visit (INDEPENDENT_AMBULATORY_CARE_PROVIDER_SITE_OTHER): Payer: Medicare Other | Admitting: Family

## 2010-06-20 DIAGNOSIS — D510 Vitamin B12 deficiency anemia due to intrinsic factor deficiency: Secondary | ICD-10-CM

## 2010-06-20 MED ORDER — CYANOCOBALAMIN 1000 MCG/ML IJ SOLN
1000.0000 ug | Freq: Once | INTRAMUSCULAR | Status: AC
  Administered 2010-06-20: 1000 ug via INTRAMUSCULAR

## 2010-06-25 ENCOUNTER — Telehealth: Payer: Self-pay | Admitting: *Deleted

## 2010-06-25 DIAGNOSIS — E119 Type 2 diabetes mellitus without complications: Secondary | ICD-10-CM

## 2010-06-25 MED ORDER — INSULIN PEN NEEDLE 29G X 12.7MM MISC: Status: DC

## 2010-06-25 NOTE — Telephone Encounter (Signed)
Received fax request from [facility] for BD UF Pen Needles. 29G x 1/2 (12.70mm) #100 x 1 refill. Refill approved.

## 2010-07-13 ENCOUNTER — Encounter: Payer: Self-pay | Admitting: Family Medicine

## 2010-07-13 ENCOUNTER — Ambulatory Visit: Payer: Medicare Other | Admitting: Family Medicine

## 2010-07-13 ENCOUNTER — Ambulatory Visit (INDEPENDENT_AMBULATORY_CARE_PROVIDER_SITE_OTHER): Payer: Medicare Other | Admitting: Family Medicine

## 2010-07-13 VITALS — BP 140/90 | HR 82 | Temp 98.0°F | Resp 20 | Ht 64.0 in | Wt 262.0 lb

## 2010-07-13 DIAGNOSIS — F411 Generalized anxiety disorder: Secondary | ICD-10-CM

## 2010-07-13 DIAGNOSIS — F419 Anxiety disorder, unspecified: Secondary | ICD-10-CM

## 2010-07-14 NOTE — Progress Notes (Signed)
OFFICE NOTE  07/14/2010  CC:  Chief Complaint  Patient presents with  . Diabetes    elevated blood sugar, sudden onset of severe shakes, - Random BS 261(in office)     HPI:   Patient is a 60 y.o. Caucasian female who is here for anxiety and stress. Describes 4-5 distinct episodes of dizziness, sweating, feeling like she is going to pass out. Occur at rest, not provoked by any position change.  Did not check glucoses during these but denies any insulin administration after which she did not eat.  Gluc's reported from last few days are actually high--200s. No CP, no SOB, no nausea, no palpitations.   Describes very emotional and stressful life events lately: an aunt is demented and there have been some misunderstandings lately involving her legal guardianship, [name] feels very guilty, loves this aunt very much.    Pertinent PMH:  DM 2, insulin requiring, poor control, diabetic nephropathy with CRI (Cr 1.34-1.5 from 09/2009 to 04/2010). HTN Depression and anxiety: sees counselor and psychiatrist regularly Hyperlipidemia Pernicious anemia  MEDS;   Outpatient Prescriptions Prior to Visit  Medication Sig Dispense Refill  . ALPRAZolam (XANAX) 1 MG tablet Take 1 mg by mouth 2 (two) times daily.        Marland Kitchen amLODipine (NORVASC) 5 MG tablet Take 1 tablet (5 mg total) by mouth daily.  90 tablet  1  . atorvastatin (LIPITOR) 40 MG tablet Take 1 tablet (40 mg total) by mouth daily.  90 tablet  1  . Calcium Carbonate-Vitamin D (CALCIUM 600+D) 600-400 MG-UNIT per tablet Take 1 tablet by mouth daily.        . cyanocobalamin 1000 MCG/ML injection Inject into the muscle every 30 (thirty) days.        Marland Kitchen estrogens, conjugated, (PREMARIN) 0.9 MG tablet Take 0.9 mg by mouth every morning.        . ferrous sulfate 325 (65 FE) MG tablet Take 325 mg by mouth daily.        . furosemide (LASIX) 20 MG tablet Take 1 tablet (20 mg total) by mouth 2 (two) times daily.  180 tablet  1  . glucose blood (FREESTYLE  LITE) test strip Use as instructed three times daily to check blood sugar  100 each  1  . insulin glargine (LANTUS SOLOSTAR) 100 UNIT/ML injection Inject 50 units subcutaneously every evening  3 mL  1  . Insulin Pen Needle (BD ULTRA-FINE PEN NEEDLES) 29G X 12.7MM MISC Use three times a day for insulin injection  100 each  1  . labetalol (NORMODYNE) 200 MG tablet Take 1 tablet (200 mg total) by mouth 2 (two) times daily.  180 tablet  1  . Lancets (FREESTYLE) lancets Use as instructed three times daily to check blood sugar  100 each  1  . Linagliptin (TRADJENTA) 5 MG TABS Take 5 mg by mouth daily.  90 tablet  1  . mirtazapine (REMERON) 15 MG tablet Take 7.5 mg by mouth. 1/2 tablet by mouth at beditme       . Omega-3 Fatty Acids (FISH OIL) 1000 MG CAPS Take 1,000 mg by mouth daily.        Marland Kitchen PARoxetine (PAXIL) 30 MG tablet Take 30 mg by mouth 2 (two) times daily.        . primidone (MYSOLINE) 250 MG tablet Take 250 mg by mouth. 1 tablet by mouth every morning and 3 tablets by mouth at bedtime       . insulin  lispro (HUMALOG KWIKPEN) 100 UNIT/ML injection Inject 20-25 Units into the skin 2 (two) times daily. Inject 20-25 units subcutaneously before breakfast and dinner  3 mL  1    PE: Blood pressure 140/90, pulse 82, temperature 98 F (36.7 C), temperature source Oral, resp. rate 20, height 5\' 4"  (1.626 m), weight 262 lb (118.842 kg), SpO2 94.00%. Gen: Alert, well appearing.  Patient is oriented to person, place, time, and situation. HEENT: Scalp without lesions or hair loss.  Ears: EACs clear, normal epithelium.  TMs with good light reflex and landmarks bilaterally.  Eyes: no injection, icteris, swelling, or exudate.  EOMI, PERRLA. Nose: no drainage or turbinate edema/swelling.  No injection or focal lesion.  Mouth: lips without lesion/swelling.  Oral mucosa pink and moist.  Dentition intact and without obvious caries or gingival swelling.  Oropharynx without erythema, exudate, or swelling.  Neck:  supple, ROM full.  Carotids 2+ bilat, without bruit.  No lymphadenopathy, thyromegaly, or mass. Chest: symmetric expansion, nonlabored respirations.  Clear and equal breath sounds in all lung fields.   CV: RRR, no m/r/g.  Peripheral pulses 2+ and symmetric. ABD: soft, NT, ND, BS normal.  No hepatospenomegaly or mass.  No bruits. EXT: no clubbing or cyanosis.  1-2 + edema bilat in lower legs.  Lab:  CBG 261  IMPRESSION AND PLAN:  ANXIETY Acute stress/anxiety--adjustment d/o with anxiety and depressive features. Gave support today.  Encouraged patient withdraw from family situation briefly to allow perspective and rest. She will increase her alprazolam from bid to tid, and will increase her remeron from 1/2 tab to 1 tab qhs. We discussed some principles of dm management in times of stress---possible need for more insulin, watch for stacking, eat snacks (esp hs).     FOLLOW UP:  Return already has appt next week.

## 2010-07-14 NOTE — Assessment & Plan Note (Signed)
Acute stress/anxiety--adjustment d/o with anxiety and depressive features. Gave support today.  Encouraged patient withdraw from family situation briefly to allow perspective and rest. She will increase her alprazolam from bid to tid, and will increase her remeron from 1/2 tab to 1 tab qhs. We discussed some principles of dm management in times of stress---possible need for more insulin, watch for stacking, eat snacks (esp hs).

## 2010-07-18 ENCOUNTER — Ambulatory Visit: Payer: Medicare Other

## 2010-07-19 ENCOUNTER — Ambulatory Visit: Payer: Medicare Other | Admitting: Internal Medicine

## 2010-07-19 ENCOUNTER — Ambulatory Visit (INDEPENDENT_AMBULATORY_CARE_PROVIDER_SITE_OTHER): Payer: Medicare Other | Admitting: Internal Medicine

## 2010-07-19 ENCOUNTER — Encounter: Payer: Self-pay | Admitting: Internal Medicine

## 2010-07-19 VITALS — BP 140/80 | HR 97 | Temp 98.5°F | Resp 22 | Wt 266.0 lb

## 2010-07-19 DIAGNOSIS — E538 Deficiency of other specified B group vitamins: Secondary | ICD-10-CM

## 2010-07-19 DIAGNOSIS — D510 Vitamin B12 deficiency anemia due to intrinsic factor deficiency: Secondary | ICD-10-CM

## 2010-07-19 DIAGNOSIS — F411 Generalized anxiety disorder: Secondary | ICD-10-CM

## 2010-07-19 DIAGNOSIS — E119 Type 2 diabetes mellitus without complications: Secondary | ICD-10-CM

## 2010-07-19 MED ORDER — CYANOCOBALAMIN 1000 MCG/ML IJ SOLN
1000.0000 ug | Freq: Once | INTRAMUSCULAR | Status: AC
  Administered 2010-07-19: 1000 ug via INTRAMUSCULAR

## 2010-07-19 NOTE — Patient Instructions (Signed)
-  Please increase your lantus to 58 units tonight. Then you may increase your lantus dose 3 units every 3 days until your fasting morning blood sugars are consistently less than 130. Please let us know if you have to increase your lantus dose to above 85 units. -Also please begin taking a 25 unit dose of humalog before lunch. -Please call the psychiatrist for an earlier appointment. -We would like to schedule fasting labwork a few days ahead of your next appointment in one month (cbc, chem7 a1c, tsh 250.0  b12 b12 deficiency  Lipid/lft 272.4)

## 2010-07-20 NOTE — Progress Notes (Signed)
  Subjective:    Patient ID: [name], female    DOB: 10/24/1950, 60 y.o.   MRN: [id number]  HPI Patient presents to clinic for evaluation of diabetes mellitus.  Note increased stress related to family. Does have history of anxiety and is seeing a therapist and psychiatrist. Recently attempted increase in Remeron and Xanax without significant improvement. Reviewed diabetic control with A1c suboptimal at 10.1 equal to approximately 243 blood sugar average. States morning blood sugars in the 200 range with the lowest being 165. A walk for exercise but is gaining weight. Currently takes 55 units of Lantus q.h.s. With 25 units of Humalog before breakfast and dinner. Monitors her blood sugar on a regular basis. Tolerate statin therapy without myalgias or abnormal LFTs. Does have chronic renal insufficiency with last known creatinine of 1.5. Blood pressure minimally elevated but under stress. Total time of visit approximately 30 minutes of which greater than 50% of the time was spent in counseling.  Reviewed past medical history, medications and allergies  Review of Systems     Objective:   Physical Exam  Nursing note and vitals reviewed. Constitutional: She appears well-developed and well-nourished. No distress.  Skin: She is not diaphoretic.  Psychiatric: She has a normal mood and affect. Her behavior is normal.          Assessment & Plan:

## 2010-07-20 NOTE — Assessment & Plan Note (Signed)
Current exacerbation with related family stressors. Encouraged patient to contact her psychiatrist for a work in appointment. Currently stable.

## 2010-07-20 NOTE — Assessment & Plan Note (Signed)
suboptimal control. Increase Lantus to 58 units then increase dose 3 units every 3 days until fasting blood sugars consistently less than 130 without hypoglycemia. Monitor fingerstick blood sugar at least twice a day. Add 25 units Humalog before lunch. Schedule close followup in 4 weeks or sooner if necessary. Call with any questions or concerns regarding blood sugar.

## 2010-07-20 NOTE — Assessment & Plan Note (Signed)
Administer B12 injection today

## 2010-07-30 ENCOUNTER — Telehealth: Payer: Self-pay | Admitting: *Deleted

## 2010-07-30 DIAGNOSIS — E119 Type 2 diabetes mellitus without complications: Secondary | ICD-10-CM

## 2010-07-30 MED ORDER — INSULIN LISPRO 100 UNIT/ML ~~LOC~~ SOLN: 25.0000 [IU] | Freq: Three times a day (TID) | SUBCUTANEOUS | Status: DC

## 2010-07-30 NOTE — Telephone Encounter (Signed)
Pt is taking 25 units Humalog kwik pen three times a day (before breakfast, lunch and dinner). Pt needs new rx sent to mail order with new directions (was getting 15pens/90days).  Needs 30 day supply sent to [facility]. Pt states her current supply of pens are malfunctioning and she has contacted [facility]. Refills have been sent to [facility] and [facility]

## 2010-08-13 ENCOUNTER — Telehealth: Payer: Self-pay | Admitting: Internal Medicine

## 2010-08-13 NOTE — Telephone Encounter (Signed)
Patient states that at last visit her insulin(lantus) was increased at night. Patient states that she is up to 82 units at night. She took her blood sugar this morning and it is 233.

## 2010-08-13 NOTE — Telephone Encounter (Signed)
Call placed to patient at [phone], she was concerned that her blood sugar was at 233. She was informed per Dr Elizebeth Koller last office note she was to call if she exceeds 85 units. Patient has verbalized understanding, and stated she is scheduled for follow on Friday 7/27 and she will discuss he blood sugars and insulin at that time.

## 2010-08-17 ENCOUNTER — Encounter: Payer: Self-pay | Admitting: Internal Medicine

## 2010-08-17 ENCOUNTER — Ambulatory Visit: Payer: Medicare Other | Admitting: Internal Medicine

## 2010-08-17 ENCOUNTER — Ambulatory Visit (INDEPENDENT_AMBULATORY_CARE_PROVIDER_SITE_OTHER): Payer: Medicare Other | Admitting: Internal Medicine

## 2010-08-17 VITALS — BP 158/90 | HR 83 | Temp 99.3°F | Resp 22 | Wt 267.0 lb

## 2010-08-17 DIAGNOSIS — D510 Vitamin B12 deficiency anemia due to intrinsic factor deficiency: Secondary | ICD-10-CM

## 2010-08-17 DIAGNOSIS — E119 Type 2 diabetes mellitus without complications: Secondary | ICD-10-CM

## 2010-08-17 MED ORDER — INSULIN PEN NEEDLE 29G X 12.7MM MISC: Status: DC

## 2010-08-17 MED ORDER — CYANOCOBALAMIN 1000 MCG/ML IJ SOLN
1000.0000 ug | Freq: Once | INTRAMUSCULAR | Status: AC
  Administered 2010-08-17: 1000 ug via INTRAMUSCULAR

## 2010-08-17 MED ORDER — FREESTYLE FREEDOM KIT: PACK | Status: DC

## 2010-08-19 NOTE — Assessment & Plan Note (Signed)
Poor control. Split dose Lantus b.i.d for absorption. Recommend endocrinology referral for assistance in management. Patient agreeable. Referral order placed.

## 2010-08-19 NOTE — Progress Notes (Signed)
  Subjective:    Patient ID: [name], female    DOB: 05/24/1950, 60 y.o.   MRN: [id number]  HPI patient presents to clinic for close followup of diabetes. Last visit Lantus was increased and has subsequently been titrated up to over 80 units q.h.s. Also a third dose of Humalog was instituted. Patient states she is pursuing appropriate diabetic diet. Despite this continues to note hyperglycemia with wide variation. No hypoglycemia. Weight remains elevated. No other alleviating or exacerbating factors. No other complaints. Total time of visit approximately 20 minutes of which greater than 50% of time was spent in counseling.  Reviewed past medical history, medications and allergies  Review of Systems see history of present illness     Objective:   Physical Exam  Nursing note and vitals reviewed. Constitutional: She appears well-developed and well-nourished.  HENT:  Head: Normocephalic and atraumatic.  Right Ear: External ear normal.  Left Ear: External ear normal.  Eyes: Conjunctivae are normal.  Neurological: She is alert.  Skin: Skin is warm and dry.  Psychiatric: She has a normal mood and affect.          Assessment & Plan:   No problem-specific assessment & plan notes found for this encounter.

## 2010-08-20 ENCOUNTER — Telehealth: Payer: Self-pay | Admitting: *Deleted

## 2010-08-20 MED ORDER — ONETOUCH ULTRA SYSTEM W/DEVICE KIT: PACK | Status: AC

## 2010-08-20 MED ORDER — GLUCOSE BLOOD VI STRP: ORAL_STRIP | Status: DC

## 2010-08-20 NOTE — Telephone Encounter (Signed)
[facility] called and left voice message stating they received a Rx for a Freestyle Freedom lite meter, and the patient is request a Rx for Onetouch ultra meter and test strips- qty 100 count.  Rx for Onetouch Ultra with test strips have been submitted to pharmacy

## 2010-09-04 ENCOUNTER — Ambulatory Visit (INDEPENDENT_AMBULATORY_CARE_PROVIDER_SITE_OTHER): Payer: Medicare Other | Admitting: Internal Medicine

## 2010-09-04 ENCOUNTER — Encounter: Payer: Self-pay | Admitting: Internal Medicine

## 2010-09-04 VITALS — BP 180/98 | HR 83 | Temp 98.9°F | Resp 20 | Wt 267.0 lb

## 2010-09-04 DIAGNOSIS — D510 Vitamin B12 deficiency anemia due to intrinsic factor deficiency: Secondary | ICD-10-CM

## 2010-09-04 DIAGNOSIS — F329 Major depressive disorder, single episode, unspecified: Secondary | ICD-10-CM

## 2010-09-04 DIAGNOSIS — R5383 Other fatigue: Secondary | ICD-10-CM

## 2010-09-04 DIAGNOSIS — D649 Anemia, unspecified: Secondary | ICD-10-CM

## 2010-09-04 LAB — HEPATIC FUNCTION PANEL
Bilirubin, Direct: 0.1 mg/dL (ref 0.0–0.3)
Indirect Bilirubin: 0.2 mg/dL (ref 0.0–0.9)
Total Bilirubin: 0.3 mg/dL (ref 0.3–1.2)

## 2010-09-04 LAB — IRON: Iron: 81 ug/dL (ref 42–145)

## 2010-09-04 LAB — CBC
MCHC: 32.6 g/dL (ref 30.0–36.0)
RDW: 14.3 % (ref 11.5–15.5)
WBC: 9.8 10*3/uL (ref 4.0–10.5)

## 2010-09-04 LAB — VITAMIN B12: Vitamin B-12: 487 pg/mL (ref 211–911)

## 2010-09-04 LAB — T4, FREE: Free T4: 0.93 ng/dL (ref 0.80–1.80)

## 2010-09-04 LAB — BASIC METABOLIC PANEL
Calcium: 9.6 mg/dL (ref 8.4–10.5)
Creat: 1.65 mg/dL — ABNORMAL HIGH (ref 0.50–1.10)

## 2010-09-05 DIAGNOSIS — R5383 Other fatigue: Secondary | ICD-10-CM | POA: Insufficient documentation

## 2010-09-05 NOTE — Assessment & Plan Note (Signed)
Suspect possible contribution from worsening depression. Obtain CBC, Chem-7, LFT, iron TSH free T4 and B12

## 2010-09-05 NOTE — Progress Notes (Signed)
  Subjective:    Patient ID: [name], female    DOB: June 24, 1950, 60 y.o.   MRN: [id number]  HPI patient presents to clinic for evaluation of fatigue. Notes worsening fatigue recently with associated anhedonia and crying spells. Has increased stressors. Is the caregiver for a relative with dementia. Is seeing a therapist and in the past seen a psychiatrist. Currently taking Paxil 60 mg daily. No SI. No other exacerbating or alleviating factors. No other complaints. Total time of visit approximately 20 minutes of which greater than 50% of the time was spent in counseling.  Reviewed past medical history, medications and allergies  Review of Systems     Objective:   Physical Exam  Nursing note and vitals reviewed. Constitutional: She appears well-developed and well-nourished. No distress.  HENT:  Head: Normocephalic and atraumatic.  Eyes: Conjunctivae are normal. No scleral icterus.  Neurological: She is alert.  Skin: She is not diaphoretic.  Psychiatric: Her speech is normal and behavior is normal. Her mood appears not anxious. Her affect is not angry, not blunt, not labile and not inappropriate. She exhibits a depressed mood.          Assessment & Plan:

## 2010-09-05 NOTE — Assessment & Plan Note (Signed)
suboptimal control. Change Paxil to pristiq-samples provided. Close followup scheduled.

## 2010-09-06 ENCOUNTER — Telehealth: Payer: Self-pay | Admitting: *Deleted

## 2010-09-06 NOTE — Telephone Encounter (Signed)
Patient called requesting the results of her lab test.

## 2010-09-06 NOTE — Telephone Encounter (Signed)
Call placed to patient [phone], she was informed per Dr Elizebeth Koller instruction and has verbalized understanding

## 2010-09-06 NOTE — Telephone Encounter (Signed)
Cbc, b12, iron, liver, and thyroid all nl. Kidney test stable

## 2010-09-13 ENCOUNTER — Telehealth: Payer: Self-pay | Admitting: Internal Medicine

## 2010-09-13 MED ORDER — INSULIN GLARGINE 100 UNIT/ML ~~LOC~~ SOLN: 42.0000 [IU] | Freq: Two times a day (BID) | SUBCUTANEOUS | Status: DC

## 2010-09-13 NOTE — Telephone Encounter (Signed)
Pt is seeing Endocrinology for her diabetes on August 30th  Wants a rx for her [place] (1 Box ) called in to [facility].   may change her medication so she does not want to get her rx  For 90 day supply

## 2010-09-13 NOTE — Telephone Encounter (Signed)
Rx sent to pharmacy   

## 2010-09-14 ENCOUNTER — Ambulatory Visit: Payer: Medicare Other

## 2010-09-18 ENCOUNTER — Ambulatory Visit (INDEPENDENT_AMBULATORY_CARE_PROVIDER_SITE_OTHER): Payer: Medicare Other | Admitting: Internal Medicine

## 2010-09-18 ENCOUNTER — Encounter: Payer: Self-pay | Admitting: Internal Medicine

## 2010-09-18 VITALS — BP 116/80 | HR 83 | Temp 98.0°F | Resp 16

## 2010-09-18 DIAGNOSIS — F329 Major depressive disorder, single episode, unspecified: Secondary | ICD-10-CM

## 2010-09-18 DIAGNOSIS — E119 Type 2 diabetes mellitus without complications: Secondary | ICD-10-CM

## 2010-09-18 DIAGNOSIS — D510 Vitamin B12 deficiency anemia due to intrinsic factor deficiency: Secondary | ICD-10-CM

## 2010-09-18 MED ORDER — CYANOCOBALAMIN 1000 MCG/ML IJ SOLN
1000.0000 ug | Freq: Once | INTRAMUSCULAR | Status: AC
  Administered 2010-09-18: 1000 ug via INTRAMUSCULAR

## 2010-09-18 NOTE — Assessment & Plan Note (Signed)
suboptimal control. Proceed with endocrinology referral this week. Consolidate lantus bid dosing back to qhs with two injection sites.

## 2010-09-18 NOTE — Progress Notes (Signed)
  Subjective:    Patient ID: [name], female    DOB: 1950-08-06, 60 y.o.   MRN: [id number]  HPI Pt presents to clinic for followup of multiple medical problems. Notes continued depressive sx's with increased family stressors. Changed from paxil to vibryyd ~2 wks ago. Continues with intermittent crying spells. Continues to see her therapist and has psychiatrist appt in 2 days. Blood sugar remain elevated in 200 range without recent hypoglycemia. Seeing endocrinology this week as well. No other complaints.   Past Medical History  Diagnosis Date  . Pernicious anemia   . Diabetes mellitus, type 2   . Hyperlipemia   . Anxiety   . Depression   . GERD (gastroesophageal reflux disease)   . Allergic rhinitis   . IBS (irritable bowel syndrome)   . Diverticulosis of colon   . Iron deficiency anemia   . Benign essential tremor   . CRI (chronic renal insufficiency)    Past Surgical History  Procedure Date  . Abdominal hysterectomy   . Cesarean section   . Tonsillectomy     reports that she has never smoked. She has never used smokeless tobacco. She reports that she does not drink alcohol. Her drug history not on file. family history includes Colitis in an unspecified family member; Diabetes in an unspecified family member; and Heart disease in an unspecified family member. Allergies  Allergen Reactions  . Advicor   . Codeine   . Meperidine Hcl      Review of Systems see hpi     Objective:   Physical Exam  Nursing note and vitals reviewed. Constitutional: She appears well-developed and well-nourished. No distress.  HENT:  Head: Normocephalic and atraumatic.  Neurological: She is alert.  Skin: She is not diaphoretic.  Psychiatric: She has a normal mood and affect. Her behavior is normal.          Assessment & Plan:

## 2010-09-18 NOTE — Assessment & Plan Note (Signed)
suboptimal control. Continue vibryyd current dosing. Continue therapist followup and is scheduled to see psychiatry this week.

## 2010-10-19 ENCOUNTER — Ambulatory Visit (INDEPENDENT_AMBULATORY_CARE_PROVIDER_SITE_OTHER): Payer: Medicare Other | Admitting: Internal Medicine

## 2010-10-19 DIAGNOSIS — D510 Vitamin B12 deficiency anemia due to intrinsic factor deficiency: Secondary | ICD-10-CM

## 2010-10-19 DIAGNOSIS — Z23 Encounter for immunization: Secondary | ICD-10-CM

## 2010-10-19 MED ORDER — CYANOCOBALAMIN 1000 MCG/ML IJ SOLN
1000.0000 ug | Freq: Once | INTRAMUSCULAR | Status: AC
  Administered 2010-10-19: 1000 ug via INTRAMUSCULAR

## 2010-10-19 NOTE — Progress Notes (Signed)
  Subjective:    Patient ID: [name], female    DOB: 27-Jan-1950, 60 y.o.   MRN: [id number]  HPI Patient presented to office for nurse visit for flu shot and b12 injection.   Review of Systems     Objective:   Physical Exam        Assessment & Plan:

## 2010-10-31 ENCOUNTER — Telehealth: Payer: Self-pay | Admitting: Internal Medicine

## 2010-10-31 NOTE — Telephone Encounter (Signed)
Patient states that she saw her endocrinologist on 10/3, received her kidney test results today. Kidney test results are 1.7. Patient has questions regarding her test results.

## 2010-10-31 NOTE — Telephone Encounter (Signed)
Call was returned to patient at [phone], she stated she was informed by her endocrinologist of her labs results, and she wanted to know how that affects her. She was advised to follow up with a call to her endocrinologist regarding her test results. Patient has verbalized understanding and agrees.

## 2010-11-12 ENCOUNTER — Ambulatory Visit (INDEPENDENT_AMBULATORY_CARE_PROVIDER_SITE_OTHER): Payer: Medicare Other | Admitting: Internal Medicine

## 2010-11-12 ENCOUNTER — Encounter: Payer: Self-pay | Admitting: Internal Medicine

## 2010-11-12 DIAGNOSIS — I10 Essential (primary) hypertension: Secondary | ICD-10-CM

## 2010-11-12 DIAGNOSIS — E538 Deficiency of other specified B group vitamins: Secondary | ICD-10-CM

## 2010-11-12 DIAGNOSIS — D509 Iron deficiency anemia, unspecified: Secondary | ICD-10-CM

## 2010-11-12 DIAGNOSIS — F32A Depression, unspecified: Secondary | ICD-10-CM

## 2010-11-12 DIAGNOSIS — F329 Major depressive disorder, single episode, unspecified: Secondary | ICD-10-CM

## 2010-11-12 DIAGNOSIS — E119 Type 2 diabetes mellitus without complications: Secondary | ICD-10-CM

## 2010-11-12 MED ORDER — CYANOCOBALAMIN 1000 MCG/ML IJ SOLN
1000.0000 ug | Freq: Once | INTRAMUSCULAR | Status: AC
  Administered 2010-11-12: 1000 ug via INTRAMUSCULAR

## 2010-11-12 NOTE — Assessment & Plan Note (Signed)
b12 monthly injxn today

## 2010-11-12 NOTE — Assessment & Plan Note (Signed)
Improving control. Has follow up with endocrinology.

## 2010-11-12 NOTE — Assessment & Plan Note (Signed)
Improving but suboptimal control. Now off norvasc and on diovan 160mg  qd. Recommend monitoring bp as outpt and report values for review.

## 2010-11-12 NOTE — Progress Notes (Signed)
  Subjective:    Patient ID: [name], female    DOB: 07/18/1950, 60 y.o.   MRN: [id number]  HPI Pt presents to clinic for followup of multiple medical problems. Notes improvement of diabetic control. Average fsbs is now in the 100's. Has occasional hypoglycemia. Now taking lantus 100 units split b/t two injxn sites under the direction of endocrinology. When saw endocrine 10/3 BP elevated and legs were swollen. Norvasc was stopped and diovan 160mg  qd started. No side effects noted. BP improving. Depressive sx's improved with viibryd without side effect. Seeing psychiatry and therapist on a regular basis. Reviewed CRI with last 7 creatinines ranging 1.34-1.65. No other complaints.  Past Medical History  Diagnosis Date  . Pernicious anemia   . Diabetes mellitus, type 2   . Hyperlipemia   . Anxiety   . Depression   . GERD (gastroesophageal reflux disease)   . Allergic rhinitis   . IBS (irritable bowel syndrome)   . Diverticulosis of colon   . Iron deficiency anemia   . Benign essential tremor   . CRI (chronic renal insufficiency)    Past Surgical History  Procedure Date  . Abdominal hysterectomy   . Cesarean section   . Tonsillectomy     reports that she has never smoked. She has never used smokeless tobacco. She reports that she does not drink alcohol. Her drug history not on file. family history includes Colitis in an unspecified family member; Diabetes in an unspecified family member; and Heart disease in an unspecified family member. Allergies  Allergen Reactions  . Advicor   . Codeine   . Meperidine Hcl      Review of Systems see hpi     Objective:   Physical Exam  Physical Exam  Nursing note and vitals reviewed. Constitutional: Appears well-developed and well-nourished. No distress.  HENT:  Head: Normocephalic and atraumatic.  Right Ear: External ear normal.  Left Ear: External ear normal.  Eyes: Conjunctivae are normal. No scleral icterus.  Neck: Neck  supple. Carotid bruit is not present.  Cardiovascular: Normal rate, regular rhythm and normal heart sounds.  Exam reveals no gallop and no friction rub.   No murmur heard. Pulmonary/Chest: Effort normal and breath sounds normal. No respiratory distress. He has no wheezes. no rales.  Lymphadenopathy:    He has no cervical adenopathy.  Neurological:Alert.  Skin: Skin is warm and dry. Not diaphoretic.  Psychiatric: Has a normal mood and affect.        Assessment & Plan:

## 2010-11-19 ENCOUNTER — Ambulatory Visit (INDEPENDENT_AMBULATORY_CARE_PROVIDER_SITE_OTHER): Payer: Medicare Other | Admitting: Family

## 2010-11-19 ENCOUNTER — Ambulatory Visit (HOSPITAL_BASED_OUTPATIENT_CLINIC_OR_DEPARTMENT_OTHER)
Admission: RE | Admit: 2010-11-19 | Discharge: 2010-11-19 | Disposition: A | Discharge status: Home / Self Care | Payer: Medicare Other | Source: Ambulatory Visit | Attending: Family | Admitting: Family

## 2010-11-19 ENCOUNTER — Encounter: Payer: Self-pay | Admitting: Family

## 2010-11-19 ENCOUNTER — Other Ambulatory Visit: Payer: Self-pay | Admitting: Family

## 2010-11-19 ENCOUNTER — Telehealth: Payer: Self-pay | Admitting: *Deleted

## 2010-11-19 ENCOUNTER — Telehealth: Payer: Self-pay | Admitting: Internal Medicine

## 2010-11-19 DIAGNOSIS — D3500 Benign neoplasm of unspecified adrenal gland: Secondary | ICD-10-CM | POA: Insufficient documentation

## 2010-11-19 DIAGNOSIS — N838 Other noninflammatory disorders of ovary, fallopian tube and broad ligament: Secondary | ICD-10-CM | POA: Insufficient documentation

## 2010-11-19 DIAGNOSIS — R3129 Other microscopic hematuria: Secondary | ICD-10-CM

## 2010-11-19 DIAGNOSIS — N390 Urinary tract infection, site not specified: Secondary | ICD-10-CM

## 2010-11-19 DIAGNOSIS — R109 Unspecified abdominal pain: Secondary | ICD-10-CM

## 2010-11-19 LAB — POCT URINALYSIS DIPSTICK
Ketones, UA: NEGATIVE
Nitrite, UA: NEGATIVE
Protein, UA: 300
pH, UA: 6

## 2010-11-19 MED ORDER — CIPROFLOXACIN HCL 500 MG PO TABS: 500.0000 mg | ORAL_TABLET | Freq: Two times a day (BID) | ORAL | Status: AC

## 2010-11-19 NOTE — Assessment & Plan Note (Addendum)
New- This is an incidental finding on CT: The left ovary is low attenuating and enlarged measuring 5.35 by 4.1 by 5.1 cm.  A pelvic ultrasound is recommended.  She sees Dr. Odella Aquas [facility]. She requested that this be reviewed with Dr. Posey Pronto. I spoke with Dr. Posey Pronto and discussed these findings.  He will have his staff contact pt to schedule ultrasound in their office this week.

## 2010-11-19 NOTE — Telephone Encounter (Signed)
Pt came to office after CT abd/pelvis today. Pt was advised that stones were seen but she does have an enlarged left ovary and Melissa would like to proceed with a pelvic u/s. Pt states she would like to contact her GYN (Dr Patel--[facility]) first. Copy of CT result given to pt today. Pt states she will call back if she wants to proceed with u/s at the [facility].

## 2010-11-19 NOTE — Telephone Encounter (Signed)
After [name] talks to her gynocologist please call the patient and advise what she is to do next.

## 2010-11-19 NOTE — Assessment & Plan Note (Signed)
Will plan to treat with Cipro.  Send urine for culture.  CT abd/pelvis was negative for stones.

## 2010-11-19 NOTE — Telephone Encounter (Signed)
Advised [name] per [facility] instruction that Dr Serita Grit office will contact [name] and arrange for scan to be done at their facility. [name] wanted to know if she should be concerned with current finding. Advised [name] per Debbrah Alar, NP no immediate concern, incidental finding. [name] voices understanding.

## 2010-11-19 NOTE — Patient Instructions (Signed)
Call if your symptoms worsen, if you see blood in your urine, or if you are not feeling better in 2-3 days. Start Cipro today. Complete your CT scan on the first floor today.    Urinary Tract Infection Infections of the urinary tract can start in several places. A bladder infection (cystitis), a kidney infection (pyelonephritis), and a prostate infection (prostatitis) are different types of urinary tract infections (UTIs). They usually get better if treated with medicines (antibiotics) that kill germs. Take all the medicine until it is gone. You or your child may feel better in a few days, but TAKE ALL MEDICINE or the infection may not respond and may become more difficult to treat. HOME CARE INSTRUCTIONS   Drink enough water and fluids to keep the urine clear or pale yellow. Cranberry juice is especially recommended, in addition to large amounts of water.   Avoid caffeine, tea, and carbonated beverages. They tend to irritate the bladder.   Alcohol may irritate the prostate.   Only take over-the-counter or prescription medicines for pain, discomfort, or fever as directed by your caregiver.  To prevent further infections:  Empty the bladder often. Avoid holding urine for long periods of time.   After a bowel movement, women should cleanse from front to back. Use each tissue only once.   Empty the bladder before and after sexual intercourse.  FINDING OUT THE RESULTS OF YOUR TEST Not all test results are available during your visit. If your or your child's test results are not back during the visit, make an appointment with your caregiver to find out the results. Do not assume everything is normal if you have not heard from your caregiver or the medical facility. It is important for you to follow up on all test results. SEEK MEDICAL CARE IF:   There is back pain.   Your baby is older than 3 months with a rectal temperature of 100.5 F (38.1 C) or higher for more than 1 day.   Your or your  child's problems (symptoms) are no better in 3 days. Return sooner if you or your child is getting worse.  SEEK IMMEDIATE MEDICAL CARE IF:   There is severe back pain or lower abdominal pain.   You or your child develops chills.   You have a fever.   Your baby is older than 3 months with a rectal temperature of 102 F (38.9 C) or higher.   Your baby is 23 months old or younger with a rectal temperature of 100.4 F (38 C) or higher.   There is nausea or vomiting.   There is continued burning or discomfort with urination.  MAKE SURE YOU:   Understand these instructions.   Will watch your condition.   Will get help right away if you are not doing well or get worse.  Document Released: 10/17/2004 Document Revised: 09/19/2010 Document Reviewed: 05/22/2006 [facility] Patient Information 2012 [place].

## 2010-11-19 NOTE — Progress Notes (Signed)
Subjective:    Patient ID: Beverly [name], female    DOB: Jul 03, 1950, 60 y.o.   MRN: [id number]  HPI  Ms.  [name] is a 60 yr old female who presents today with chief complaint of flank pain.  She reports that flank pain is bilateral R>L.  She reports that her flank pain is 4-5/10 and is intermittent. She describes th pain as soreness that radiates from the flank down into the right groin.   First noticed 9 days ago.  Saw Dr. Elizebeth Koller last week but was unable to give a urine specimen at that time.  She reports aching in her legs.  She notes chronic urinary frequency which she attributes to her diabetes. She reports + associated dysuria.  Denies associated fever or gross hematuria.  She denies known history of kidney stones.    Review of Systems Past Medical History  Diagnosis Date  . Pernicious anemia   . Diabetes mellitus, type 2   . Hyperlipemia   . Anxiety   . Depression   . GERD (gastroesophageal reflux disease)   . Allergic rhinitis   . IBS (irritable bowel syndrome)   . Diverticulosis of colon   . Iron deficiency anemia   . Benign essential tremor   . CRI (chronic renal insufficiency)     History   Social History  . Marital Status: Married    Spouse Name: N/A    Number of Children: N/A  . Years of Education: N/A   Occupational History  . Not on file.   Social History Main Topics  . Smoking status: Never Smoker   . Smokeless tobacco: Never Used  . Alcohol Use: No  . Drug Use: Not on file  . Sexually Active: Not on file   Other Topics Concern  . Not on file   Social History Narrative  . No narrative on file    Past Surgical History  Procedure Date  . Abdominal hysterectomy   . Cesarean section   . Tonsillectomy     Family History  Problem Relation Age of Onset  . Heart disease    . Diabetes    . Colitis      ulcerative    Allergies  Allergen Reactions  . Advicor   . Codeine   . Meperidine Hcl     Current Outpatient Prescriptions on File Prior  to Visit  Medication Sig Dispense Refill  . ALPRAZolam (XANAX) 1 MG tablet Take 1 mg by mouth 2 (two) times daily.        Marland Kitchen atorvastatin (LIPITOR) 40 MG tablet Take 1 tablet (40 mg total) by mouth daily.  90 tablet  1  . Blood Glucose Monitoring Suppl (ONE TOUCH ULTRA SYSTEM KIT) W/DEVICE KIT Use as instructed to check blood sugars four times a day ( Dx Code 250.020)  1 each  0  . Calcium Carbonate-Vitamin D (CALCIUM 600+D) 600-400 MG-UNIT per tablet Take 1 tablet by mouth daily.        . cyanocobalamin 1000 MCG/ML injection Inject into the muscle every 30 (thirty) days.        Marland Kitchen estrogens, conjugated, (PREMARIN) 0.9 MG tablet Take 0.9 mg by mouth every morning.        . ferrous sulfate 325 (65 FE) MG tablet Take 325 mg by mouth daily.        . furosemide (LASIX) 20 MG tablet Take 1 tablet (20 mg total) by mouth 2 (two) times daily.  180 tablet  1  .  glucose blood (ONE TOUCH ULTRA TEST) test strip Use as instructed to check blood sugar four time daily Dx Code 250.02  300 each  3  . insulin glargine (LANTUS) 100 UNIT/ML injection Inject 110 Units into the skin at bedtime.       . insulin lispro (HUMALOG) 100 UNIT/ML injection Inject subcutaneously  30 before breakfast, 30 before lunch and 40 before dinner daily      . Insulin Pen Needle (BD ULTRA-FINE PEN NEEDLES) 29G X 12.7MM MISC Use three times a day for insulin injection  400 each  2  . Insulin Pen Needle (BD ULTRA-FINE PEN NEEDLES) 29G X 12.7MM MISC Use three times a day for insulin injection  200 each  0  . labetalol (NORMODYNE) 200 MG tablet Take 1 tablet (200 mg total) by mouth 2 (two) times daily.  180 tablet  1  . mirtazapine (REMERON) 15 MG tablet Take 15 mg by mouth at bedtime.       . Omega-3 Fatty Acids (FISH OIL) 1000 MG CAPS Take 4 capsules daily.      . primidone (MYSOLINE) 250 MG tablet 1 tablet by mouth every morning and 3 tablets by mouth at bedtime      . valsartan (DIOVAN) 160 MG tablet Take 160 mg by mouth daily.        .  Vilazodone HCl (VIIBRYD) 40 MG TABS Take 40 mg by mouth daily.          BP 130/92  Pulse 72  Temp(Src) 97.8 F (36.6 C) (Oral)  Resp 16  Ht 5' 4.02" (1.626 m)  Wt 267 lb (121.11 kg)  BMI 45.81 kg/m2       Objective:   Physical Exam  Constitutional: She appears well-developed and well-nourished.       Morbidly obese white female.  Cardiovascular: Normal rate and regular rhythm.   No murmur heard. Pulmonary/Chest: Effort normal and breath sounds normal. No respiratory distress. She has no wheezes. She has no rales. She exhibits no tenderness.  Abdominal: Soft.       Mild tenderness to palpation right lower abdomen.No guarding.  Genitourinary:       + CVAT on the right.  Psychiatric: She has a normal mood and affect. Her speech is normal and behavior is normal.          Assessment & Plan:

## 2010-11-21 LAB — URINE CULTURE: Colony Count: NO GROWTH

## 2010-11-29 ENCOUNTER — Telehealth: Payer: Self-pay | Admitting: Family

## 2010-11-29 NOTE — Telephone Encounter (Signed)
Patient call to thank you for ordering her CT  ,  She saw Dr Posey Pronto  OB GYN    Found an ovarian cyst   Will have surgery  November  29

## 2010-12-14 ENCOUNTER — Ambulatory Visit: Payer: Medicare Other

## 2011-01-18 ENCOUNTER — Telehealth: Payer: Self-pay | Admitting: Internal Medicine

## 2011-01-18 NOTE — Telephone Encounter (Signed)
Please advise 

## 2011-01-18 NOTE — Telephone Encounter (Signed)
She has been seeing Dr Rexene Edison and she if very unhappy with the drugs he is recommending for her.  She would like a referral to someone else.  She has to talk to a therapist and then Dr Erling Cruz takes the report from the therapist and prescribs and he does not spend any time talking with the patient.

## 2011-01-23 ENCOUNTER — Ambulatory Visit (INDEPENDENT_AMBULATORY_CARE_PROVIDER_SITE_OTHER): Payer: Medicare Other | Admitting: Internal Medicine

## 2011-01-23 ENCOUNTER — Telehealth: Payer: Self-pay | Admitting: Internal Medicine

## 2011-01-23 ENCOUNTER — Encounter: Payer: Self-pay | Admitting: Internal Medicine

## 2011-01-23 DIAGNOSIS — F329 Major depressive disorder, single episode, unspecified: Secondary | ICD-10-CM

## 2011-01-23 DIAGNOSIS — I10 Essential (primary) hypertension: Secondary | ICD-10-CM

## 2011-01-23 DIAGNOSIS — E119 Type 2 diabetes mellitus without complications: Secondary | ICD-10-CM

## 2011-01-23 DIAGNOSIS — F411 Generalized anxiety disorder: Secondary | ICD-10-CM

## 2011-01-23 MED ORDER — PAROXETINE HCL 30 MG PO TABS: 30.0000 mg | ORAL_TABLET | Freq: Two times a day (BID) | ORAL | Status: DC

## 2011-01-23 NOTE — Telephone Encounter (Signed)
plz have her come in at 4:15 pm

## 2011-01-23 NOTE — Assessment & Plan Note (Addendum)
Exacerbated by medication changes and question hormone changes from oophorectomy.  She does not have good relationship with her current psychiatrist.  Refer to Dr. Sabra Heck.  Restart previous psych meds. Spent greater than 30 minutes counseling patient.

## 2011-01-23 NOTE — Assessment & Plan Note (Signed)
We discussed how hypoglycemia can worsen anxiety symptoms. Patient advised to avoid hypoglycemia.

## 2011-01-23 NOTE — Progress Notes (Signed)
Subjective:    Patient ID: [name], female    DOB: 1950-07-01, 61 y.o.   MRN: [id number]  HPI  61 year old white female with past medical history type 2 diabetes, depression and anxiety for followup. Interval medical history-patient was seen by nurse practitioner for urinary tract infection. This led to ruling out possible kidney infection with CT of abdomen and pelvis. Incidentally enlarged left ovary was discovered. She was referred to GYN and underwent oophorectomy. Pathology was negative for cancer but since her oophorectomy, patient has had increase in hot flashes and mood changes.  Her previous PCP changed her anti depression medication from Paxil to viibryd. She is still working with her psychiatrist of many years - Dr. Percell Miller love. Since starting on new anti-depression medication she has been more agitated and states she can "rip a phone book". Her sleep quality has also significantly deteriorated.  She was also started on Saphris and bedtime.  Dr. Erling Cruz has been gradually tapering Viibryd.  She was also tried on Fanept but stopped due to side effects.   Review of Systems Her blood sugars have improved.  AM blood sugars are less than 150.  Denies pelvic pain from surgery  Past Medical History  Diagnosis Date  . Pernicious anemia   . Diabetes mellitus, type 2   . Hyperlipemia   . Anxiety   . Depression   . GERD (gastroesophageal reflux disease)   . Allergic rhinitis   . IBS (irritable bowel syndrome)   . Diverticulosis of colon   . Iron deficiency anemia   . Benign essential tremor   . CRI (chronic renal insufficiency)     History   Social History  . Marital Status: Married    Spouse Name: N/A    Number of Children: N/A  . Years of Education: N/A   Occupational History  . Not on file.   Social History Main Topics  . Smoking status: Never Smoker   . Smokeless tobacco: Never Used  . Alcohol Use: No  . Drug Use: Not on file  . Sexually Active: Not on file    Other Topics Concern  . Not on file   Social History Narrative  . No narrative on file    Past Surgical History  Procedure Date  . Abdominal hysterectomy   . Cesarean section   . Tonsillectomy     Family History  Problem Relation Age of Onset  . Heart disease    . Diabetes    . Colitis      ulcerative    Allergies  Allergen Reactions  . Advicor   . Codeine   . Meperidine Hcl     Current Outpatient Prescriptions on File Prior to Visit  Medication Sig Dispense Refill  . ALPRAZolam (XANAX) 1 MG tablet Take 1 mg by mouth 3 (three) times daily.       Marland Kitchen atorvastatin (LIPITOR) 40 MG tablet Take 1 tablet (40 mg total) by mouth daily.  90 tablet  1  . Blood Glucose Monitoring Suppl (ONE TOUCH ULTRA SYSTEM KIT) W/DEVICE KIT Use as instructed to check blood sugars four times a day ( Dx Code 250.020)  1 each  0  . Calcium Carbonate-Vitamin D (CALCIUM 600+D) 600-400 MG-UNIT per tablet Take 1 tablet by mouth daily.        . cyanocobalamin 1000 MCG/ML injection Inject into the muscle every 30 (thirty) days.        . ferrous sulfate 325 (65 FE) MG tablet Take 325  mg by mouth daily.        . furosemide (LASIX) 20 MG tablet Take 1 tablet (20 mg total) by mouth 2 (two) times daily.  180 tablet  1  . glucose blood (ONE TOUCH ULTRA TEST) test strip Use as instructed to check blood sugar four time daily Dx Code 250.02  300 each  3  . insulin glargine (LANTUS) 100 UNIT/ML injection Inject 100 Units into the skin at bedtime.       . insulin lispro (HUMALOG) 100 UNIT/ML injection Inject subcutaneously  30 before breakfast, 30 before lunch and 40 before dinner daily      . Insulin Pen Needle (BD ULTRA-FINE PEN NEEDLES) 29G X 12.7MM MISC Use three times a day for insulin injection  400 each  2  . Insulin Pen Needle (BD ULTRA-FINE PEN NEEDLES) 29G X 12.7MM MISC Use three times a day for insulin injection  200 each  0  . labetalol (NORMODYNE) 200 MG tablet Take 1 tablet (200 mg total) by mouth 2  (two) times daily.  180 tablet  1  . mirtazapine (REMERON) 15 MG tablet Take 15 mg by mouth at bedtime.       . Omega-3 Fatty Acids (FISH OIL) 1000 MG CAPS Take 4 capsules daily.      . primidone (MYSOLINE) 250 MG tablet 1 tablet by mouth every morning and 3 tablets by mouth at bedtime      . valsartan (DIOVAN) 160 MG tablet Take 160 mg by mouth daily.          BP 130/86  Temp(Src) 98.3 F (36.8 C) (Oral)  Wt 250 lb (113.399 kg)      Objective:   Physical Exam  Constitutional: She appears well-developed and well-nourished. No distress.  HENT:  Head: Normocephalic and atraumatic.  Neck: Normal range of motion.  Cardiovascular: Normal rate and regular rhythm.   Pulmonary/Chest: Effort normal and breath sounds normal. No respiratory distress. She has no wheezes.  Lymphadenopathy:    She has no cervical adenopathy.  Neurological:       Resting tremor  Psychiatric:       Appears anxious and slightly agitated       Assessment & Plan:

## 2011-01-23 NOTE — Assessment & Plan Note (Signed)
Stable. BP: 130/86 mmHg

## 2011-01-23 NOTE — Telephone Encounter (Signed)
Pt said she spoke with you yesterday about her anxiety and was told she would be worked in today. Please advise.

## 2011-01-24 ENCOUNTER — Other Ambulatory Visit: Payer: Self-pay | Admitting: Internal Medicine

## 2011-01-24 ENCOUNTER — Telehealth: Payer: Self-pay | Admitting: Internal Medicine

## 2011-01-24 DIAGNOSIS — F329 Major depressive disorder, single episode, unspecified: Secondary | ICD-10-CM

## 2011-01-24 NOTE — Telephone Encounter (Signed)
Per Dr Shawna Orleans take Saphris 5 mg 1/2 tab qhs x1 wk and then 1/2 tab qod x1 wk then stop, pt aware

## 2011-01-24 NOTE — Telephone Encounter (Signed)
Was seen on yesterday and was tols to taper off a medicine. Please advise her of what to do. She has questions. Thanks.

## 2011-01-25 ENCOUNTER — Telehealth: Payer: Self-pay | Admitting: Internal Medicine

## 2011-01-25 NOTE — Telephone Encounter (Signed)
Pt just started back on PARoxetine (PAXIL) 60 MG tablet  And wanted to know if she should have started back at a lower dose. Pt is having a lot of anxiety

## 2011-01-25 NOTE — Telephone Encounter (Signed)
Pt is taking 30 mg bid and wants to know if 60 mg is to much.  She had a good day yesterday and today is not so good.  per Dr Shawna Orleans pt needs to give a few days for old med to get out of her system and new med to get in.  Pt aware

## 2011-01-25 NOTE — Telephone Encounter (Signed)
She can start with 1/2 of paxil bid x 1 week then take full dose

## 2011-02-06 ENCOUNTER — Encounter: Payer: Self-pay | Admitting: Internal Medicine

## 2011-02-06 ENCOUNTER — Ambulatory Visit (INDEPENDENT_AMBULATORY_CARE_PROVIDER_SITE_OTHER): Payer: Medicare Other | Admitting: Internal Medicine

## 2011-02-06 DIAGNOSIS — E119 Type 2 diabetes mellitus without complications: Secondary | ICD-10-CM

## 2011-02-06 DIAGNOSIS — F411 Generalized anxiety disorder: Secondary | ICD-10-CM

## 2011-02-06 MED ORDER — ASENAPINE MALEATE 5 MG SL SUBL: 2.5000 mg | SUBLINGUAL_TABLET | Freq: Every day | SUBLINGUAL | Status: DC

## 2011-02-06 NOTE — Assessment & Plan Note (Signed)
Followed by endocrinology. She reports her recent A1c was 5.7. She is having episodes of hypoglycemia. Decrease her Lantus and Humalog doses.  I suspect hypoglycemia exacerbating anxiety issues.

## 2011-02-06 NOTE — Progress Notes (Signed)
Subjective:    Patient ID: [name], female    DOB: 10-14-1950, 61 y.o.   MRN: [id number]  HPI  61 year old white female previously seen for exacerbation of anxiety for followup. Since switching back to her previous psychiatric medications she has been doing somewhat better. She has had some difficulty tapering off saphris at bedtime.  She is to the point where she is taking this medication every other day. On the days that she does not take her medication she has difficulty sleeping.  She has established with a new psychologist - Dr. Jac Canavan.  She will meet with PA for medication adjustment on the 22nd January.  DM II - patient reports her recent A1c was 5.7. She has been having intermittent hypoglycemia.  She is followed by endocrinologist.  Review of Systems Negative for chest pain,  Increase in tremors  Past Medical History  Diagnosis Date  . Pernicious anemia   . Diabetes mellitus, type 2   . Hyperlipemia   . Anxiety   . Depression   . GERD (gastroesophageal reflux disease)   . Allergic rhinitis   . IBS (irritable bowel syndrome)   . Diverticulosis of colon   . Iron deficiency anemia   . Benign essential tremor   . CRI (chronic renal insufficiency)     History   Social History  . Marital Status: Married    Spouse Name: N/A    Number of Children: N/A  . Years of Education: N/A   Occupational History  . Not on file.   Social History Main Topics  . Smoking status: Never Smoker   . Smokeless tobacco: Never Used  . Alcohol Use: No  . Drug Use: Not on file  . Sexually Active: Not on file   Other Topics Concern  . Not on file   Social History Narrative  . No narrative on file    Past Surgical History  Procedure Date  . Abdominal hysterectomy   . Cesarean section   . Tonsillectomy     Family History  Problem Relation Age of Onset  . Heart disease    . Diabetes    . Colitis      ulcerative    Allergies  Allergen Reactions  . Advicor     . Codeine   . Demerol   . Meperidine Hcl     Current Outpatient Prescriptions on File Prior to Visit  Medication Sig Dispense Refill  . ALPRAZolam (XANAX) 1 MG tablet Take 1 mg by mouth 4 (four) times daily.       Marland Kitchen atorvastatin (LIPITOR) 40 MG tablet Take 1 tablet (40 mg total) by mouth daily.  90 tablet  1  . Blood Glucose Monitoring Suppl (ONE TOUCH ULTRA SYSTEM KIT) W/DEVICE KIT Use as instructed to check blood sugars four times a day ( Dx Code 250.020)  1 each  0  . Calcium Carbonate-Vitamin D (CALCIUM 600+D) 600-400 MG-UNIT per tablet Take 1 tablet by mouth daily.        . cyanocobalamin 1000 MCG/ML injection Inject into the muscle every 30 (thirty) days.        . ferrous sulfate 325 (65 FE) MG tablet Take 325 mg by mouth daily.        . furosemide (LASIX) 20 MG tablet Take 1 tablet (20 mg total) by mouth 2 (two) times daily.  180 tablet  1  . glucose blood (ONE TOUCH ULTRA TEST) test strip Use as instructed to check blood sugar four  time daily Dx Code 250.02  300 each  3  . Insulin Pen Needle (BD ULTRA-FINE PEN NEEDLES) 29G X 12.7MM MISC Use three times a day for insulin injection  400 each  2  . Insulin Pen Needle (BD ULTRA-FINE PEN NEEDLES) 29G X 12.7MM MISC Use three times a day for insulin injection  200 each  0  . labetalol (NORMODYNE) 200 MG tablet Take 1 tablet (200 mg total) by mouth 2 (two) times daily.  180 tablet  1  . mirtazapine (REMERON) 15 MG tablet Take 15 mg by mouth at bedtime.       . Omega-3 Fatty Acids (FISH OIL) 1000 MG CAPS Take 4 capsules daily.      Marland Kitchen PARoxetine (PAXIL) 30 MG tablet Take 1 tablet (30 mg total) by mouth 2 (two) times daily.  60 tablet  3  . primidone (MYSOLINE) 250 MG tablet 1 tablet by mouth every morning and 3 tablets by mouth at bedtime      . valsartan (DIOVAN) 160 MG tablet Take 160 mg by mouth daily.          BP 144/86  Pulse 88  Temp(Src) 98.5 F (36.9 C) (Oral)  Wt 250 lb (113.399 kg)       Objective:   Physical Exam   Constitutional: She appears well-developed and well-nourished.  HENT:  Head: Normocephalic and atraumatic.  Cardiovascular: Normal rate, regular rhythm and normal heart sounds.   Pulmonary/Chest: Effort normal and breath sounds normal. She has no wheezes. She has no rales.  Neurological:       Resting tremor  Psychiatric:       Slightly anxious          Assessment & Plan:

## 2011-02-06 NOTE — Assessment & Plan Note (Signed)
Improved with medication change.  She is having difficulty tapering off saphris.  I suggest she stay on 1/2 tab at bedtime until she has follow up with her psychologist.  Also, I suspect hypoglycemia may be playing a role in exacerbating her anxiety.  Patient advised to decrease her insulin doses.

## 2011-03-06 ENCOUNTER — Ambulatory Visit (INDEPENDENT_AMBULATORY_CARE_PROVIDER_SITE_OTHER): Payer: Medicare Other | Admitting: Internal Medicine

## 2011-03-06 DIAGNOSIS — E119 Type 2 diabetes mellitus without complications: Secondary | ICD-10-CM

## 2011-03-06 DIAGNOSIS — F411 Generalized anxiety disorder: Secondary | ICD-10-CM
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
## 2011-03-06 NOTE — Patient Instructions (Addendum)
Please complete the following lab tests before your next follow up appointment: BMET, A1c - 250.00 TSH, Free T4 - 300.00

## 2011-03-06 NOTE — Assessment & Plan Note (Addendum)
Mild improvement with counseling.  Continue same medications.  Spend greater than 30 minutes counseling patient.

## 2011-03-06 NOTE — Assessment & Plan Note (Signed)
Less hypoglycemia with lower dose of insulin.  A1c before next OV.  She would like for [place] to resume mgt of diabetes vs her endocrinologist.

## 2011-03-14 ENCOUNTER — Ambulatory Visit: Payer: Medicare Other | Admitting: Internal Medicine

## 2011-04-29 ENCOUNTER — Telehealth: Payer: Self-pay | Admitting: Internal Medicine

## 2011-04-29 ENCOUNTER — Other Ambulatory Visit: Payer: Medicare Other

## 2011-04-29 DIAGNOSIS — E119 Type 2 diabetes mellitus without complications: Secondary | ICD-10-CM

## 2011-04-29 DIAGNOSIS — F411 Generalized anxiety disorder: Secondary | ICD-10-CM

## 2011-04-29 NOTE — Telephone Encounter (Signed)
Patient called to check on her appt and I noticed that there is not an order for her labs. I rescheduled them to tomorrow. Please order labs for draw tomorrow.

## 2011-04-29 NOTE — Telephone Encounter (Signed)
rx sent in electronically 

## 2011-04-30 ENCOUNTER — Other Ambulatory Visit (INDEPENDENT_AMBULATORY_CARE_PROVIDER_SITE_OTHER): Payer: Medicare Other

## 2011-04-30 DIAGNOSIS — E119 Type 2 diabetes mellitus without complications: Secondary | ICD-10-CM

## 2011-04-30 DIAGNOSIS — F411 Generalized anxiety disorder: Secondary | ICD-10-CM

## 2011-04-30 LAB — BASIC METABOLIC PANEL
CO2: 25 mEq/L (ref 19–32)
Calcium: 8.8 mg/dL (ref 8.4–10.5)
GFR: 36.14 mL/min — ABNORMAL LOW (ref >60.00)
Sodium: 140 mEq/L (ref 135–145)

## 2011-04-30 LAB — T4, FREE: Free T4: 0.64 ng/dL (ref 0.60–1.60)

## 2011-04-30 LAB — TSH: TSH: 2.08 u[IU]/mL (ref 0.35–5.50)

## 2011-05-01 ENCOUNTER — Telehealth: Payer: Self-pay | Admitting: Internal Medicine

## 2011-05-01 NOTE — Telephone Encounter (Signed)
Not back yet, pt aware

## 2011-05-01 NOTE — Telephone Encounter (Signed)
Patient called stating that she would like a call back with her lab results. Please assist.

## 2011-05-02 LAB — HEMOGLOBIN A1C: Hgb A1c MFr Bld: 8 % — ABNORMAL HIGH (ref 4.6–6.5)

## 2011-05-03 ENCOUNTER — Encounter: Payer: Self-pay | Admitting: Internal Medicine

## 2011-05-03 ENCOUNTER — Ambulatory Visit (INDEPENDENT_AMBULATORY_CARE_PROVIDER_SITE_OTHER): Payer: Medicare Other | Admitting: Internal Medicine

## 2011-05-03 VITALS — BP 150/90 | Temp 99.5°F | Wt 269.0 lb

## 2011-05-03 DIAGNOSIS — I10 Essential (primary) hypertension: Secondary | ICD-10-CM

## 2011-05-03 DIAGNOSIS — E119 Type 2 diabetes mellitus without complications: Secondary | ICD-10-CM

## 2011-05-03 MED ORDER — LABETALOL HCL 300 MG PO TABS: 300.0000 mg | ORAL_TABLET | Freq: Two times a day (BID) | ORAL | Status: DC

## 2011-05-03 NOTE — Progress Notes (Signed)
Subjective:    Patient ID: [name], female    DOB: 1950/03/20, 61 y.o.   MRN: [id number]  HPI  61 year old white female for followup regarding type 2 diabetes and anxiety. Since decreasing her mealtime insulin dose she is having much less hypoglycemia. This has also helped her anxiety issues. She continues to be seen by therapist and psychiatrist. They're considering further medication adjustments.  Her hemoglobin A1c has increased 8.0. She denies hypoglycemic episodes.  Htn - good medication compliance.  Cr is stable.   Review of Systems Negative for chest pain  Past Medical History  Diagnosis Date  . Pernicious anemia   . Diabetes mellitus, type 2   . Hyperlipemia   . Anxiety   . Depression   . GERD (gastroesophageal reflux disease)   . Allergic rhinitis   . IBS (irritable bowel syndrome)   . Diverticulosis of colon   . Iron deficiency anemia   . Benign essential tremor   . CRI (chronic renal insufficiency)     History   Social History  . Marital Status: Married    Spouse Name: N/A    Number of Children: N/A  . Years of Education: N/A   Occupational History  . Not on file.   Social History Main Topics  . Smoking status: Never Smoker   . Smokeless tobacco: Never Used  . Alcohol Use: No  . Drug Use: Not on file  . Sexually Active: Not on file   Other Topics Concern  . Not on file   Social History Narrative  . No narrative on file    Past Surgical History  Procedure Date  . Abdominal hysterectomy   . Cesarean section   . Tonsillectomy     Family History  Problem Relation Age of Onset  . Heart disease    . Diabetes    . Colitis      ulcerative    Allergies  Allergen Reactions  . Advicor   . Codeine   . Demerol   . Meperidine Hcl     Current Outpatient Prescriptions on File Prior to Visit  Medication Sig Dispense Refill  . ALPRAZolam (XANAX) 1 MG tablet Take 1 mg by mouth 4 (four) times daily.       Marland Kitchen asenapine (SAPHRIS) 5 MG  SUBL Place 1 tablet (5 mg total) under the tongue at bedtime.  30 tablet  0  . atorvastatin (LIPITOR) 40 MG tablet Take 1 tablet (40 mg total) by mouth daily.  90 tablet  1  . Blood Glucose Monitoring Suppl (ONE TOUCH ULTRA SYSTEM KIT) W/DEVICE KIT Use as instructed to check blood sugars four times a day ( Dx Code 250.020)  1 each  0  . Calcium Carbonate-Vitamin D (CALCIUM 600+D) 600-400 MG-UNIT per tablet Take 1 tablet by mouth daily.        . cyanocobalamin 1000 MCG/ML injection Inject into the muscle every 30 (thirty) days.        Marland Kitchen estradiol (VIVELLE-DOT) 0.075 MG/24HR Place 1 patch onto the skin 2 (two) times a week.      . ferrous sulfate 325 (65 FE) MG tablet Take 325 mg by mouth daily.        . furosemide (LASIX) 20 MG tablet Take 1 tablet (20 mg total) by mouth 2 (two) times daily.  180 tablet  1  . glucose blood (ONE TOUCH ULTRA TEST) test strip Use as instructed to check blood sugar four time daily Dx Code 250.02  300  each  3  . insulin glargine (LANTUS) 100 UNIT/ML injection Inject 80 Units into the skin at bedtime.  10 mL    . Insulin Pen Needle (BD ULTRA-FINE PEN NEEDLES) 29G X 12.7MM MISC Use three times a day for insulin injection  400 each  2  . mirtazapine (REMERON) 15 MG tablet Take 15 mg by mouth at bedtime.       . Omega-3 Fatty Acids (FISH OIL) 1000 MG CAPS Take 4 capsules daily.      Marland Kitchen PARoxetine (PAXIL) 30 MG tablet Take 1 tablet (30 mg total) by mouth 2 (two) times daily.  60 tablet  3  . primidone (MYSOLINE) 250 MG tablet 1 tablet by mouth every morning and 3 tablets by mouth at bedtime      . valsartan (DIOVAN) 160 MG tablet Take 160 mg by mouth daily.        Marland Kitchen DISCONTD: insulin lispro (HUMALOG) 100 UNIT/ML injection Inject subcutaneously  20 before breakfast, 20 before lunch and 30 before dinner daily  10 mL    . DISCONTD: labetalol (NORMODYNE) 200 MG tablet Take 1 tablet (200 mg total) by mouth 2 (two) times daily.  180 tablet  1    BP 150/90  Temp(Src) 99.5 F (37.5  C) (Oral)  Wt 269 lb (122.018 kg)       Objective:   Physical Exam  Constitutional: She appears well-developed and well-nourished.  Cardiovascular: Normal rate, regular rhythm and normal heart sounds.   Pulmonary/Chest: Effort normal and breath sounds normal. She has no wheezes.  Neurological:       Mild resting tremor  Skin: Skin is warm and dry.  Psychiatric:       Slightly anxious          Assessment & Plan:

## 2011-05-03 NOTE — Assessment & Plan Note (Signed)
BP suboptimally controlled.  Increase labetalol to 300 mg bid.

## 2011-05-03 NOTE — Assessment & Plan Note (Signed)
A1c suboptimal at 8.0.  Increase mealtime insulin dose.

## 2011-05-06 ENCOUNTER — Ambulatory Visit: Payer: Medicare Other | Admitting: Internal Medicine

## 2011-05-27 ENCOUNTER — Telehealth: Payer: Self-pay | Admitting: Internal Medicine

## 2011-05-27 NOTE — Telephone Encounter (Signed)
Pt called and said that she used to have mail order, but now she uses [facility] on [address] in [place]. Pt is needing a new script called in to [facility] asap.

## 2011-05-27 NOTE — Telephone Encounter (Signed)
Left message for pt to call back  °

## 2011-05-28 MED ORDER — INSULIN LISPRO 100 UNIT/ML ~~LOC~~ SOLN: SUBCUTANEOUS | Status: DC

## 2011-05-28 NOTE — Telephone Encounter (Signed)
rx sent in electronically 

## 2011-05-28 NOTE — Telephone Encounter (Signed)
Medication pt needs refilled is insulin lispro (HUMALOG) 100 UNIT/ML injection

## 2011-05-29 ENCOUNTER — Other Ambulatory Visit: Payer: Self-pay | Admitting: Internal Medicine

## 2011-05-29 NOTE — Telephone Encounter (Signed)
Pt stated she needs humalog flexpens call into  [facility] [phone]

## 2011-05-29 NOTE — Telephone Encounter (Signed)
rx sent in yesterday, pt aware

## 2011-06-03 MED ORDER — INSULIN LISPRO 100 UNIT/ML ~~LOC~~ SOLN: SUBCUTANEOUS | Status: DC

## 2011-06-03 NOTE — Telephone Encounter (Signed)
Addended by: Townsend Roger D on: 06/03/2011 01:56 PM   Modules accepted: Orders

## 2011-06-03 NOTE — Telephone Encounter (Signed)
Pharmacy called, pt aware

## 2011-06-03 NOTE — Telephone Encounter (Signed)
Pt  Went to pharmacy to pick up refill pt said that she needs the humalog flexpens not the vile. Pt [facility] flexpen.

## 2011-06-07 ENCOUNTER — Other Ambulatory Visit: Payer: Self-pay | Admitting: Internal Medicine

## 2011-06-07 DIAGNOSIS — I10 Essential (primary) hypertension: Secondary | ICD-10-CM

## 2011-06-07 MED ORDER — LABETALOL HCL 300 MG PO TABS: 300.0000 mg | ORAL_TABLET | Freq: Two times a day (BID) | ORAL | Status: DC

## 2011-06-07 NOTE — Telephone Encounter (Signed)
Pt needs refill on labetalol 300mg  call [facility]. Pt stated MD increase her labetalol

## 2011-06-10 ENCOUNTER — Telehealth: Payer: Self-pay | Admitting: Internal Medicine

## 2011-06-10 NOTE — Telephone Encounter (Signed)
Pt gyn Dr Odella Aquas increase her vivelle-dot from .075mg  to 1mg . Pt has not started new patches yet. Pt would like to know whether increase in estrogen will  help her anxiety and hot flashes.

## 2011-06-11 NOTE — Telephone Encounter (Signed)
Yes, it has a chance to chance to improve her hot flashes but it may not improve her anxiety
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
## 2011-06-12 NOTE — Telephone Encounter (Signed)
I suggest we contact Dr. Iona Coach office again to see if pt can be seen before holiday weekend

## 2011-06-13 NOTE — Telephone Encounter (Signed)
Pt is seeing a NP at Dr Iona Coach office tomorrow 06/14/11

## 2011-06-14 ENCOUNTER — Ambulatory Visit (INDEPENDENT_AMBULATORY_CARE_PROVIDER_SITE_OTHER): Payer: Medicare Other | Admitting: Internal Medicine

## 2011-06-14 ENCOUNTER — Encounter: Payer: Self-pay | Admitting: Internal Medicine

## 2011-06-14 VITALS — BP 146/72 | HR 89 | Temp 99.5°F | Wt 260.0 lb

## 2011-06-14 DIAGNOSIS — F411 Generalized anxiety disorder: Secondary | ICD-10-CM

## 2011-06-14 DIAGNOSIS — I10 Essential (primary) hypertension: Secondary | ICD-10-CM

## 2011-06-14 DIAGNOSIS — R634 Abnormal weight loss: Secondary | ICD-10-CM

## 2011-06-14 DIAGNOSIS — E119 Type 2 diabetes mellitus without complications: Secondary | ICD-10-CM

## 2011-06-14 NOTE — Patient Instructions (Signed)
Your blood pressure readings or good today after you sit down and relax. In the 136/78 range.  Agreed to that you should do slow  breathing technique and have your anxiety treated as you are doing.  I would not change her blood pressure medication at this time but have you keep your appointment with Dr. Shawna Orleans in couple weeks.

## 2011-06-14 NOTE — Progress Notes (Signed)
  Subjective:    Patient ID: [name], female    DOB: [birth year], 61 y.o.   MRN: [id number]  HPI Patient comes in today for SDA for  new problem evaluation. Pt of dr Shawna Orleans who has ht dm and anxiety and essential tremor. Recently put on buspar 15 tid for her anxiety. Worried because her bp was 168/88 at therapist office. Was down 9 # worried about the weight loss.  Bg 150 to 200s  ocass 300s. No polyuria poly dypsia. Last a1c 8.0 and normal thyroid test  Review of Systems No cp sob syncope ; has ded appetite from anxiety but bo abd pain . New neuro sx  Past history family history social history reviewed in the electronic medical record. Med reviewed      Objective:   Physical Exam BP 146/72  Pulse 89  Temp(Src) 99.5 F (37.5 C) (Oral)  Wt 260 lb (117.935 kg)  SpO2 95% Repeat 136/74   138/78  Wt Readings from Last 3 Encounters:  06/14/11 260 lb (117.935 kg)  05/03/11 269 lb (122.018 kg)  03/06/11 262 lb (118.842 kg)  WDWn in nad but quite anxious and tremor right arm.  Calmer with time Repeat bp right large138/78 left 136/74 sitting Neck no masses Chest:  Clear to A without wheezes rales or rhonchi CV:  S1-S2 no gallops or murmurs peripheral perfusion is normal extr trc edema Psych anxious speech and attention normal     Assessment & Plan:  Elevated BP in setting of rx ht.  Normal after sitting a while  Elevation at therapist off poss from [facility] anx effect.On labetolol diovan and bid lasix  At this time would not change rx. Keep fu appt with dr Shawna Orleans. Weight loss in obese diabetic : pt  says eating less after becoming anxious but no abd pain or other system sx and bg reported in control to modest elevation .   No obv cause   But very anxious about other disease causing weight loss.   Reviewed and advise   Observation and bg control  Anxiety   Under rx new med buspar.  Total visit 34mins > 50% spent counseling and coordinating care   keep appt with Dr Shawna Orleans  No med change at  present.  Disc diet intake .

## 2011-06-15 DIAGNOSIS — R634 Abnormal weight loss: Secondary | ICD-10-CM | POA: Insufficient documentation

## 2011-06-27 ENCOUNTER — Other Ambulatory Visit: Payer: Self-pay | Admitting: Internal Medicine

## 2011-07-05 ENCOUNTER — Ambulatory Visit (INDEPENDENT_AMBULATORY_CARE_PROVIDER_SITE_OTHER): Payer: Medicare Other | Admitting: Internal Medicine

## 2011-07-05 VITALS — BP 134/76 | HR 96 | Temp 98.5°F | Wt 262.0 lb

## 2011-07-05 DIAGNOSIS — E119 Type 2 diabetes mellitus without complications: Secondary | ICD-10-CM

## 2011-07-05 DIAGNOSIS — I10 Essential (primary) hypertension: Secondary | ICD-10-CM

## 2011-07-05 DIAGNOSIS — F411 Generalized anxiety disorder: Secondary | ICD-10-CM

## 2011-07-05 MED ORDER — INSULIN GLARGINE 100 UNIT/ML ~~LOC~~ SOLN: 80.0000 [IU] | Freq: Every day | SUBCUTANEOUS | Status: DC

## 2011-07-05 MED ORDER — INSULIN LISPRO 100 UNIT/ML ~~LOC~~ SOLN: SUBCUTANEOUS | Status: DC

## 2011-07-05 NOTE — Progress Notes (Signed)
Subjective:    Patient ID: [name], female    DOB: Jul 24, 1950, 61 y.o.   MRN: [id number]  HPI  61 year old white female with type 2 diabetes, hypertension and severe anxiety for followup. Patient had episode of anxiety attack that was worse than usual last month. She was seen by Dr. Regis Bill regarding elevated blood pressure readings and decreased weight. Patient reports significantly decreasing her caloric intake leading to weight loss.  She continues to struggle with significant anxiety symptoms. Her psychiatrist increased her BuSpar 30 mg 3 times a day. She feels like higher dose is helping. She is able to drive and shop without panic attacks.  Type 2 diabetes-patient's morning blood sugars have been usually greater than 200. Afternoon blood sugars are normal. She denies hypoglycemia. Patient reports diabetic eye exam was performed several months ago and is reported normal. She denies paresthesias.  Review of Systems Negative for chest pain, negative for hypoglycemia  Past Medical History  Diagnosis Date  . Pernicious anemia   . Diabetes mellitus, type 2   . Hyperlipemia   . Anxiety   . Depression   . GERD (gastroesophageal reflux disease)   . Allergic rhinitis   . IBS (irritable bowel syndrome)   . Diverticulosis of colon   . Iron deficiency anemia   . Benign essential tremor   . CRI (chronic renal insufficiency)     History   Social History  . Marital Status: Married    Spouse Name: N/A    Number of Children: N/A  . Years of Education: N/A   Occupational History  . Not on file.   Social History Main Topics  . Smoking status: Never Smoker   . Smokeless tobacco: Never Used  . Alcohol Use: No  . Drug Use: Not on file  . Sexually Active: Not on file   Other Topics Concern  . Not on file   Social History Narrative  . No narrative on file    Past Surgical History  Procedure Date  . Abdominal hysterectomy   . Cesarean section   . Tonsillectomy      Family History  Problem Relation Age of Onset  . Heart disease    . Diabetes    . Colitis      ulcerative    Allergies  Allergen Reactions  . Codeine   . Demerol   . Meperidine Hcl   . Niacin-Lovastatin Er     Current Outpatient Prescriptions on File Prior to Visit  Medication Sig Dispense Refill  . ALPRAZolam (XANAX) 1 MG tablet Take 1 mg by mouth 4 (four) times daily.       Marland Kitchen asenapine (SAPHRIS) 5 MG SUBL Place 1 tablet (5 mg total) under the tongue at bedtime.  30 tablet  0  . atorvastatin (LIPITOR) 40 MG tablet Take 1 tablet (40 mg total) by mouth daily.  90 tablet  1  . Blood Glucose Monitoring Suppl (ONE TOUCH ULTRA SYSTEM KIT) W/DEVICE KIT Use as instructed to check blood sugars four times a day ( Dx Code 250.020)  1 each  0  . busPIRone (BUSPAR) 15 MG tablet Take 30 mg by mouth 3 (three) times daily.       . Calcium Carbonate-Vitamin D (CALCIUM 600+D) 600-400 MG-UNIT per tablet Take 1 tablet by mouth daily.        . cyanocobalamin 1000 MCG/ML injection Inject into the muscle every 30 (thirty) days.        . ferrous sulfate 325 (65  FE) MG tablet Take 325 mg by mouth daily.        . furosemide (LASIX) 20 MG tablet Take 1 tablet (20 mg total) by mouth 2 (two) times daily.  180 tablet  1  . Insulin Pen Needle (BD ULTRA-FINE PEN NEEDLES) 29G X 12.7MM MISC Use three times a day for insulin injection  400 each  2  . labetalol (NORMODYNE) 300 MG tablet Take 1 tablet (300 mg total) by mouth 2 (two) times daily.  180 tablet  1  . mirtazapine (REMERON) 15 MG tablet Take 15 mg by mouth at bedtime.       . Omega-3 Fatty Acids (FISH OIL) 1000 MG CAPS Take 4 capsules daily.      . ONE TOUCH ULTRA TEST test strip USE 4 TIMES DAILY TO CHECK BLOOD SUGARS  300 each  3  . PARoxetine (PAXIL) 30 MG tablet Take 1 tablet (30 mg total) by mouth 2 (two) times daily.  60 tablet  3  . primidone (MYSOLINE) 250 MG tablet 1 tablet by mouth every morning and 3 tablets by mouth at bedtime      .  valsartan (DIOVAN) 160 MG tablet Take 160 mg by mouth daily.        Marland Kitchen DISCONTD: insulin glargine (LANTUS) 100 UNIT/ML injection Inject 80 Units into the skin at bedtime.  10 mL    . DISCONTD: insulin lispro (HUMALOG KWIKPEN) 100 UNIT/ML injection 25 units before breakfast, 25 before lunch, and 35 before dinner daily  3 mL  3    BP 134/76  Pulse 96  Temp 98.5 F (36.9 C) (Oral)  Wt 262 lb (118.842 kg)       Objective:   Physical Exam  Constitutional: She is oriented to person, place, and time. She appears well-developed and well-nourished.  HENT:  Head: Normocephalic.  Cardiovascular: Normal rate, regular rhythm and normal heart sounds.   Pulmonary/Chest: Effort normal and breath sounds normal. She has no wheezes. She has no rales.  Musculoskeletal:       Foot exam - no cracks or fissures. Normal sensation to vibration.  Neurological: She is alert and oriented to person, place, and time. No cranial nerve deficit.  Skin: Skin is warm and dry.  Psychiatric:       Appears anxious,  Tremor in right hand       Assessment & Plan:

## 2011-07-05 NOTE — Assessment & Plan Note (Signed)
Repeat A1c today. Her morning blood sugars are still usually greater than 200. She is already on 80 units of Lantus. I suggest increasing her using mealtime dose of Humalog. Patient will call office within 1 to 2 weeks if she has persistent hyperglycemia in the morning.

## 2011-07-05 NOTE — Assessment & Plan Note (Signed)
BP improved.  No change in medication regimen.  BP: 134/76 mmHg  Lab Results  Component Value Date   CREATININE 1.6* 04/30/2011

## 2011-07-05 NOTE — Patient Instructions (Addendum)
Please complete the following lab tests before your next follow up appointment: BMET - 401.9 HgA1c - 250.02 Lipid panel - 250.02

## 2011-07-05 NOTE — Assessment & Plan Note (Signed)
Some improvement with Buspar.  Continue counseling sessions with psychiatrist.

## 2011-07-06 LAB — BASIC METABOLIC PANEL
CO2: 26 mEq/L (ref 19–32)
Chloride: 106 mEq/L (ref 96–112)
Creat: 1.62 mg/dL — ABNORMAL HIGH (ref 0.50–1.10)

## 2011-07-08 ENCOUNTER — Telehealth: Payer: Self-pay | Admitting: Internal Medicine

## 2011-07-08 LAB — MICROALBUMIN / CREATININE URINE RATIO

## 2011-07-08 NOTE — Telephone Encounter (Signed)
Pt aware of results 

## 2011-07-08 NOTE — Telephone Encounter (Signed)
Pt called req to get lab results asap. Pt says that she is going to [place] and will not have any cellphone service there and will not have a home phone. Req call back asap.

## 2011-07-10 ENCOUNTER — Telehealth: Payer: Self-pay | Admitting: Internal Medicine

## 2011-07-10 NOTE — Telephone Encounter (Signed)
Pt was at her Neurologist yesterday and her blood pressure was 172/98. Pt wanted to come in today for a blood pressure check. Please advise

## 2011-07-10 NOTE — Telephone Encounter (Signed)
lmom for pt to call back and schedule an appt

## 2011-07-10 NOTE — Telephone Encounter (Signed)
We don't have anything today but can come in tomorrow at 2 pm

## 2011-07-11 ENCOUNTER — Ambulatory Visit (INDEPENDENT_AMBULATORY_CARE_PROVIDER_SITE_OTHER): Payer: Medicare Other | Admitting: Internal Medicine

## 2011-07-11 ENCOUNTER — Encounter: Payer: Self-pay | Admitting: Internal Medicine

## 2011-07-11 VITALS — BP 140/70 | Temp 98.9°F | Wt 264.0 lb

## 2011-07-11 DIAGNOSIS — I10 Essential (primary) hypertension: Secondary | ICD-10-CM

## 2011-07-11 DIAGNOSIS — E119 Type 2 diabetes mellitus without complications: Secondary | ICD-10-CM

## 2011-07-11 LAB — MICROALBUMIN / CREATININE URINE RATIO
Creatinine,U: 90.6 mg/dL
Microalb, Ur: 45.6 mg/dL — ABNORMAL HIGH (ref 0.0–1.9)

## 2011-07-11 MED ORDER — AMLODIPINE BESYLATE 2.5 MG PO TABS: 2.5000 mg | ORAL_TABLET | Freq: Every day | ORAL | Status: DC

## 2011-07-11 NOTE — Assessment & Plan Note (Signed)
Patient experiencing sporadic elevated blood pressure readings. I suspect elevations are associated with anxiety attacks. Add low-dose amlodipine 2.5 mg once daily to her current regimen.  BP: 140/70 mmHg  Lab Results  Component Value Date   CREATININE 1.62* 07/05/2011

## 2011-07-11 NOTE — Addendum Note (Signed)
Addended by: Joyce Gross R on: 07/11/2011 02:35 PM   Modules accepted: Orders

## 2011-07-11 NOTE — Progress Notes (Signed)
Subjective:    Patient ID: [name], female    DOB: August 16, 1950, 61 y.o.   MRN: [id number]  HPI  A 61 year old white female with diabetes mellitus type 2, hypertension and depression complains of elevated blood pressure readings over the last several weeks. Elevated readings are somewhat sporadic. They are worse when she is seen at a physician's office. She had recent visit with a neurologist for resting tremor and her systolic blood pressure was noted to be 172.  She notes good medication compliance.  She is currently taking Diovan 160 mg, labetalol 300 mg twice daily and Lasix 20 mg twice daily. She was previously on amlodipine but discontinued due to lower extremity edema.  Review of Systems Negative for chest pain or shortness of breath  She is not started any new over-the-counter medications or supplements  Past Medical History  Diagnosis Date  . Pernicious anemia   . Diabetes mellitus, type 2   . Hyperlipemia   . Anxiety   . Depression   . GERD (gastroesophageal reflux disease)   . Allergic rhinitis   . IBS (irritable bowel syndrome)   . Diverticulosis of colon   . Iron deficiency anemia   . Benign essential tremor   . CRI (chronic renal insufficiency)     History   Social History  . Marital Status: Married    Spouse Name: N/A    Number of Children: N/A  . Years of Education: N/A   Occupational History  . Not on file.   Social History Main Topics  . Smoking status: Never Smoker   . Smokeless tobacco: Never Used  . Alcohol Use: No  . Drug Use: Not on file  . Sexually Active: Not on file   Other Topics Concern  . Not on file   Social History Narrative  . No narrative on file    Past Surgical History  Procedure Date  . Abdominal hysterectomy   . Cesarean section   . Tonsillectomy     Family History  Problem Relation Age of Onset  . Heart disease    . Diabetes    . Colitis      ulcerative    Allergies  Allergen Reactions  . Codeine   .  Demerol   . Meperidine Hcl   . Niacin-Lovastatin Er     Current Outpatient Prescriptions on File Prior to Visit  Medication Sig Dispense Refill  . ALPRAZolam (XANAX) 1 MG tablet Take 1 mg by mouth 4 (four) times daily.       Marland Kitchen amLODipine (NORVASC) 2.5 MG tablet Take 1 tablet (2.5 mg total) by mouth daily.  30 tablet  3  . asenapine (SAPHRIS) 5 MG SUBL Place 1 tablet (5 mg total) under the tongue at bedtime.  30 tablet  0  . atorvastatin (LIPITOR) 40 MG tablet Take 1 tablet (40 mg total) by mouth daily.  90 tablet  1  . Blood Glucose Monitoring Suppl (ONE TOUCH ULTRA SYSTEM KIT) W/DEVICE KIT Use as instructed to check blood sugars four times a day ( Dx Code 250.020)  1 each  0  . busPIRone (BUSPAR) 15 MG tablet Take 30 mg by mouth 3 (three) times daily.       . Calcium Carbonate-Vitamin D (CALCIUM 600+D) 600-400 MG-UNIT per tablet Take 1 tablet by mouth daily.        . cyanocobalamin 1000 MCG/ML injection Inject into the muscle every 30 (thirty) days.        Marland Kitchen estradiol (  VIVELLE-DOT) 0.1 MG/24HR Place 1 patch onto the skin 2 (two) times a week.      . ferrous sulfate 325 (65 FE) MG tablet Take 325 mg by mouth daily.        . furosemide (LASIX) 20 MG tablet Take 1 tablet (20 mg total) by mouth 2 (two) times daily.  180 tablet  1  . insulin glargine (LANTUS) 100 UNIT/ML injection Inject 80 Units into the skin at bedtime.  30 mL  1  . insulin lispro (HUMALOG KWIKPEN) 100 UNIT/ML injection 25 units before breakfast, 25 units before lunch, and 40 units before dinner daily  3 mL  3  . Insulin Pen Needle (BD ULTRA-FINE PEN NEEDLES) 29G X 12.7MM MISC Use three times a day for insulin injection  400 each  2  . labetalol (NORMODYNE) 300 MG tablet Take 1 tablet (300 mg total) by mouth 2 (two) times daily.  180 tablet  1  . mirtazapine (REMERON) 15 MG tablet Take 15 mg by mouth at bedtime.       . Omega-3 Fatty Acids (FISH OIL) 1000 MG CAPS Take 4 capsules daily.      . ONE TOUCH ULTRA TEST test strip USE  4 TIMES DAILY TO CHECK BLOOD SUGARS  300 each  3  . PARoxetine (PAXIL) 30 MG tablet Take 1 tablet (30 mg total) by mouth 2 (two) times daily.  60 tablet  3  . primidone (MYSOLINE) 250 MG tablet 1 tablet by mouth every morning and 3 tablets by mouth at bedtime      . valsartan (DIOVAN) 160 MG tablet Take 160 mg by mouth daily.          BP 140/70  Temp 98.9 F (37.2 C) (Oral)  Wt 264 lb (119.75 kg)       Objective:   Physical Exam  Constitutional: She is oriented to person, place, and time. She appears well-developed and well-nourished.  Cardiovascular: Normal rate, regular rhythm and normal heart sounds.   Pulmonary/Chest: Effort normal and breath sounds normal.  Musculoskeletal: She exhibits no edema.  Neurological: She is alert and oriented to person, place, and time.       Resting tremor       Assessment & Plan:

## 2011-07-16 ENCOUNTER — Telehealth: Payer: Self-pay | Admitting: Internal Medicine

## 2011-07-16 NOTE — Telephone Encounter (Signed)
Switch to levemir.  Same dose and sig.  Advise pt to call if significant change in her blood sugars

## 2011-07-16 NOTE — Telephone Encounter (Signed)
[name]/Dr. 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 New change to 2013 formulary for Lantus pen: patient must try & fail Levemir pen before Lantus will be approved. I don't see Levemir has been tried. Please advise if she can try it, or if not, why not. Thanks!

## 2011-07-16 NOTE — Telephone Encounter (Signed)
This is a Dr Shawna Orleans pt forwarded to Dr Shawna Orleans for advice

## 2011-07-16 NOTE — Telephone Encounter (Signed)
Patient called stating that her pharmacy informed her that she will need a prior auth for her lantus and she states she only has one left. Please assist.

## 2011-07-16 NOTE — Telephone Encounter (Signed)
[name], I can work the prior [employer], but I can't do anything about whose name is on the script. Thanks!

## 2011-07-16 NOTE — Telephone Encounter (Signed)
Pt called stating her previous doctors name is on the prescription and will not refill insulin glargine (LANTUS) 100 UNIT/ML injection. Pt requesting rx be resent to [facility]

## 2011-07-17 MED ORDER — INSULIN DETEMIR 100 UNIT/ML ~~LOC~~ SOLN: 80.0000 [IU] | Freq: Two times a day (BID) | SUBCUTANEOUS | Status: DC

## 2011-07-17 NOTE — Addendum Note (Signed)
Addended by: Townsend Roger D on: 07/17/2011 10:11 AM   Modules accepted: Orders

## 2011-07-17 NOTE — Telephone Encounter (Signed)
rx sent in electronically, pt aware 

## 2011-07-18 ENCOUNTER — Telehealth: Payer: Self-pay | Admitting: Internal Medicine

## 2011-07-18 DIAGNOSIS — I10 Essential (primary) hypertension: Secondary | ICD-10-CM

## 2011-07-18 MED ORDER — FUROSEMIDE 20 MG PO TABS: 20.0000 mg | ORAL_TABLET | Freq: Two times a day (BID) | ORAL | Status: DC

## 2011-07-18 NOTE — Telephone Encounter (Signed)
rx sent in electronically 

## 2011-07-18 NOTE — Telephone Encounter (Signed)
Patient called stating that she need a new rx for furosemide 20mg  [id number] called into [facility] in [place]. Please assist.

## 2011-08-01 ENCOUNTER — Telehealth: Payer: Self-pay | Admitting: *Deleted

## 2011-08-01 NOTE — Telephone Encounter (Signed)
Insurance won't cover Lantus.  Her BS readings have been in the 200's.  One day it was 271 and another day 174.  Please advise

## 2011-08-02 NOTE — Telephone Encounter (Signed)
It was switched to levemir.  Is she taking same dose?

## 2011-08-02 NOTE — Telephone Encounter (Signed)
Humalog 25 units at breakfast and lunch and 40 units at dinner, she checks sugars before breakfast, lunch, and dinner and pt usually checks sugar after dinner about 3 hours after dinner.  She has never checked sugars after meals except for the last one

## 2011-08-02 NOTE — Telephone Encounter (Signed)
Have pt check her blood sugar 2 hrs after breakfast, lunch and dinner for 2-3 days.  Call office with readings

## 2011-08-02 NOTE — Telephone Encounter (Signed)
Pt aware.

## 2011-08-02 NOTE — Telephone Encounter (Signed)
How much short acting insulin is she using?  Has she checked blood sugars 1-2 hrs after her meal?

## 2011-08-02 NOTE — Telephone Encounter (Signed)
Pt is taking levemir 80 units bid and bs was Monday 230, Tuesday 229, Wednesday 216, Thursday 187, and today 214

## 2011-08-13 ENCOUNTER — Encounter: Payer: Self-pay | Admitting: Internal Medicine

## 2011-08-13 ENCOUNTER — Ambulatory Visit (INDEPENDENT_AMBULATORY_CARE_PROVIDER_SITE_OTHER): Payer: Medicare Other | Admitting: Internal Medicine

## 2011-08-13 VITALS — BP 126/80 | HR 72 | Temp 98.9°F | Wt 268.0 lb

## 2011-08-13 DIAGNOSIS — I10 Essential (primary) hypertension: Secondary | ICD-10-CM

## 2011-08-13 DIAGNOSIS — E119 Type 2 diabetes mellitus without complications: Secondary | ICD-10-CM

## 2011-08-13 MED ORDER — VALSARTAN 160 MG PO TABS: 160.0000 mg | ORAL_TABLET | Freq: Every day | ORAL | Status: DC

## 2011-08-13 MED ORDER — INSULIN GLARGINE 100 UNIT/ML ~~LOC~~ SOLN: 80.0000 [IU] | Freq: Every day | SUBCUTANEOUS | Status: DC

## 2011-08-13 NOTE — Assessment & Plan Note (Signed)
Poor fasting blood sugars since switching from Lantus to Levemir. Restart Lantus  80 units at bedtime. We will petition her insurance company to cover Lantus.

## 2011-08-13 NOTE — Progress Notes (Signed)
Subjective:    Patient ID: [name], female    DOB: February 12, 1950, 61 y.o.   MRN: [id number]  HPI  61 year old white female with history of type 2 diabetes, hypertension and chronic renal insufficiency for followup. Since previous visit her Lantus was changed to Levemir due to formulary changes. She was previously on Lantus 80 units once daily. She was switched to levemir 80 units twice daily. Despite maximum dose to Levemir her morning blood sugars are still greater than 200. She is using her mealtime insulin as directed and her postprandial blood sugars are normal.  Hypertension-patient well maintained on Diovan 160 mg. She has tried other ACE inhibitors and ARBs.  She reports she has had least side effects and consistent BP control with Diovan.  Review of Systems Negative for chest pain or shortness of breath  Past Medical History  Diagnosis Date  . Pernicious anemia   . Diabetes mellitus, type 2   . Hyperlipemia   . Anxiety   . Depression   . GERD (gastroesophageal reflux disease)   . Allergic rhinitis   . IBS (irritable bowel syndrome)   . Diverticulosis of colon   . Iron deficiency anemia   . Benign essential tremor   . CRI (chronic renal insufficiency)     History   Social History  . Marital Status: Married    Spouse Name: N/A    Number of Children: N/A  . Years of Education: N/A   Occupational History  . Not on file.   Social History Main Topics  . Smoking status: Never Smoker   . Smokeless tobacco: Never Used  . Alcohol Use: No  . Drug Use: Not on file  . Sexually Active: Not on file   Other Topics Concern  . Not on file   Social History Narrative  . No narrative on file    Past Surgical History  Procedure Date  . Abdominal hysterectomy   . Cesarean section   . Tonsillectomy     Family History  Problem Relation Age of Onset  . Heart disease    . Diabetes    . Colitis      ulcerative    Allergies  Allergen Reactions  . Codeine   .  Demerol   . Meperidine Hcl   . Niacin-Lovastatin Er     Current Outpatient Prescriptions on File Prior to Visit  Medication Sig Dispense Refill  . ALPRAZolam (XANAX) 1 MG tablet Take 1 mg by mouth 4 (four) times daily.       Marland Kitchen amLODipine (NORVASC) 2.5 MG tablet Take 1 tablet (2.5 mg total) by mouth daily.  30 tablet  3  . asenapine (SAPHRIS) 5 MG SUBL Place 1 tablet (5 mg total) under the tongue at bedtime.  30 tablet  0  . atorvastatin (LIPITOR) 40 MG tablet Take 1 tablet (40 mg total) by mouth daily.  90 tablet  1  . Blood Glucose Monitoring Suppl (ONE TOUCH ULTRA SYSTEM KIT) W/DEVICE KIT Use as instructed to check blood sugars four times a day ( Dx Code 250.020)  1 each  0  . busPIRone (BUSPAR) 15 MG tablet Take 30 mg by mouth 3 (three) times daily.       . Calcium Carbonate-Vitamin D (CALCIUM 600+D) 600-400 MG-UNIT per tablet Take 1 tablet by mouth daily.        . cyanocobalamin 1000 MCG/ML injection Inject into the muscle every 30 (thirty) days.        Marland Kitchen estradiol (  VIVELLE-DOT) 0.1 MG/24HR Place 1 patch onto the skin 2 (two) times a week.      . ferrous sulfate 325 (65 FE) MG tablet Take 325 mg by mouth daily.        . furosemide (LASIX) 20 MG tablet Take 1 tablet (20 mg total) by mouth 2 (two) times daily.  180 tablet  1  . insulin lispro (HUMALOG KWIKPEN) 100 UNIT/ML injection 25 units before breakfast, 25 units before lunch, and 40 units before dinner daily  3 mL  3  . Insulin Pen Needle (BD ULTRA-FINE PEN NEEDLES) 29G X 12.7MM MISC Use three times a day for insulin injection  400 each  2  . labetalol (NORMODYNE) 300 MG tablet Take 1 tablet (300 mg total) by mouth 2 (two) times daily.  180 tablet  1  . mirtazapine (REMERON) 15 MG tablet Take 15 mg by mouth at bedtime.       . Omega-3 Fatty Acids (FISH OIL) 1000 MG CAPS Take 4 capsules daily.      . ONE TOUCH ULTRA TEST test strip USE 4 TIMES DAILY TO CHECK BLOOD SUGARS  300 each  3  . PARoxetine (PAXIL) 30 MG tablet Take 1 tablet (30  mg total) by mouth 2 (two) times daily.  60 tablet  3  . primidone (MYSOLINE) 250 MG tablet 1 tablet by mouth every morning and 3 tablets by mouth at bedtime      . DISCONTD: valsartan (DIOVAN) 160 MG tablet Take 160 mg by mouth daily.          BP 126/80  Pulse 72  Temp 98.9 F (37.2 C) (Oral)  Wt 268 lb (121.564 kg)       Objective:   Physical Exam   Constitutional: pleasant, obese, resting tremor Cardiovascular: Normal rate, regular rhythm and normal heart sounds.   Pulmonary/Chest: Effort normal and breath sounds normal.  No wheezes. No rales.  Neurological: Alert. No cranial nerve deficit.  Skin: Skin is warm and dry.  Psychiatric: Normal mood and affect. Behavior is normal.       Assessment & Plan:

## 2011-08-13 NOTE — Assessment & Plan Note (Signed)
Improved with addition of amlodipine.  She has not tolerated other ACEs or ARBs well in the past.  Continue Diovan 160 mg.

## 2011-08-15 ENCOUNTER — Telehealth: Payer: Self-pay | Admitting: Family Medicine

## 2011-08-15 DIAGNOSIS — E785 Hyperlipidemia, unspecified: Secondary | ICD-10-CM

## 2011-08-15 MED ORDER — ATORVASTATIN CALCIUM 40 MG PO TABS: 40.0000 mg | ORAL_TABLET | Freq: Every day | ORAL | Status: DC

## 2011-08-15 NOTE — Addendum Note (Signed)
Addended by: Townsend Roger D on: 08/15/2011 10:27 AM   Modules accepted: Orders

## 2011-08-15 NOTE — Telephone Encounter (Signed)
Pt needs a refill on her Atorvastatin. Please send to [facility] in [place]. Thanks! Patient has 2 pills left.

## 2011-08-15 NOTE — Telephone Encounter (Signed)
rx sent in electronically 

## 2011-09-10 ENCOUNTER — Ambulatory Visit (INDEPENDENT_AMBULATORY_CARE_PROVIDER_SITE_OTHER): Payer: Medicare Other | Admitting: Internal Medicine

## 2011-09-10 ENCOUNTER — Encounter: Payer: Self-pay | Admitting: Internal Medicine

## 2011-09-10 VITALS — BP 134/90 | HR 84 | Temp 98.8°F | Wt 268.0 lb

## 2011-09-10 DIAGNOSIS — E119 Type 2 diabetes mellitus without complications: Secondary | ICD-10-CM

## 2011-09-10 MED ORDER — INSULIN LISPRO 100 UNIT/ML ~~LOC~~ SOLN: SUBCUTANEOUS | Status: DC

## 2011-09-10 NOTE — Assessment & Plan Note (Signed)
Overall her blood sugar control has improved since switching back from Levemir to Lantus. She is having issues with hyperglycemia after her evening meal. Increase Humalog to 50 units before evening meal.

## 2011-09-10 NOTE — Progress Notes (Signed)
Subjective:    Patient ID: [name], female    DOB: [birth year], 61 y.o.   MRN: [id number]  HPI  61 year old white female with history of uncontrolled type 2 diabetes, hypertension and chronic renal insufficiency for followup. Patient has been monitoring her post prandial blood sugars. Usually 2 hours after breakfast and lunch, her blood sugars are fairly normal (less than 150). She has elevated blood sugars greater than 200 usually after her evening meal. She is currently using 25 units of Humalog before breakfast and lunch and 40 units before dinner. She is keeping track of which food items that are elevating her blood sugars.  Overall her morning blood sugars have improved since switching back to Lantus from Levemir.  Review of Systems Negative for chest pain  Past Medical History  Diagnosis Date  . Pernicious anemia   . Diabetes mellitus, type 2   . Hyperlipemia   . Anxiety   . Depression   . GERD (gastroesophageal reflux disease)   . Allergic rhinitis   . IBS (irritable bowel syndrome)   . Diverticulosis of colon   . Iron deficiency anemia   . Benign essential tremor   . CRI (chronic renal insufficiency)     History   Social History  . Marital Status: Married    Spouse Name: N/A    Number of Children: N/A  . Years of Education: N/A   Occupational History  . Not on file.   Social History Main Topics  . Smoking status: Never Smoker   . Smokeless tobacco: Never Used  . Alcohol Use: No  . Drug Use: Not on file  . Sexually Active: Not on file   Other Topics Concern  . Not on file   Social History Narrative  . No narrative on file    Past Surgical History  Procedure Date  . Abdominal hysterectomy   . Cesarean section   . Tonsillectomy     Family History  Problem Relation Age of Onset  . Heart disease    . Diabetes    . Colitis      ulcerative    Allergies  Allergen Reactions  . Codeine   . Demerol   . Meperidine Hcl   . Niacin-Lovastatin  Er     Current Outpatient Prescriptions on File Prior to Visit  Medication Sig Dispense Refill  . ALPRAZolam (XANAX) 1 MG tablet Take 1 mg by mouth 4 (four) times daily.       Marland Kitchen amLODipine (NORVASC) 2.5 MG tablet Take 1 tablet (2.5 mg total) by mouth daily.  30 tablet  3  . asenapine (SAPHRIS) 5 MG SUBL Place 1 tablet (5 mg total) under the tongue at bedtime.  30 tablet  0  . atorvastatin (LIPITOR) 40 MG tablet Take 1 tablet (40 mg total) by mouth daily.  90 tablet  1  . Blood Glucose Monitoring Suppl (ONE TOUCH ULTRA SYSTEM KIT) W/DEVICE KIT Use as instructed to check blood sugars four times a day ( Dx Code 250.020)  1 each  0  . busPIRone (BUSPAR) 15 MG tablet Take 30 mg by mouth 3 (three) times daily.       . Calcium Carbonate-Vitamin D (CALCIUM 600+D) 600-400 MG-UNIT per tablet Take 1 tablet by mouth daily.        . cyanocobalamin 1000 MCG/ML injection Inject into the muscle every 30 (thirty) days.        Marland Kitchen estradiol (VIVELLE-DOT) 0.1 MG/24HR Place 1 patch onto the skin 2 (  two) times a week.      . ferrous sulfate 325 (65 FE) MG tablet Take 325 mg by mouth daily.        . furosemide (LASIX) 20 MG tablet Take 1 tablet (20 mg total) by mouth 2 (two) times daily.  180 tablet  1  . insulin glargine (LANTUS SOLOSTAR) 100 UNIT/ML injection Inject 80 Units into the skin at bedtime.  5 pen  5  . Insulin Pen Needle (BD ULTRA-FINE PEN NEEDLES) 29G X 12.7MM MISC Use three times a day for insulin injection  400 each  2  . labetalol (NORMODYNE) 300 MG tablet Take 1 tablet (300 mg total) by mouth 2 (two) times daily.  180 tablet  1  . mirtazapine (REMERON) 15 MG tablet Take 15 mg by mouth at bedtime.       . Omega-3 Fatty Acids (FISH OIL) 1000 MG CAPS Take 4 capsules daily.      . ONE TOUCH ULTRA TEST test strip USE 4 TIMES DAILY TO CHECK BLOOD SUGARS  300 each  3  . PARoxetine (PAXIL) 30 MG tablet Take 1 tablet (30 mg total) by mouth 2 (two) times daily.  60 tablet  3  . primidone (MYSOLINE) 250 MG  tablet 1 tablet by mouth every morning and 3 tablets by mouth at bedtime      . valsartan (DIOVAN) 160 MG tablet Take 1 tablet (160 mg total) by mouth daily.  90 tablet  1  . DISCONTD: insulin lispro (HUMALOG KWIKPEN) 100 UNIT/ML injection 25 units before breakfast, 25 units before lunch, and 40 units before dinner daily  3 mL  3    BP 134/90  Pulse 84  Temp 98.8 F (37.1 C) (Oral)  Wt 268 lb (121.564 kg)       Objective:   Physical Exam  Constitutional: She is oriented to person, place, and time. She appears well-developed and well-nourished.  Cardiovascular: Normal rate, regular rhythm and normal heart sounds.   Pulmonary/Chest: Effort normal and breath sounds normal. She has no wheezes.  Musculoskeletal: She exhibits no edema.  Neurological: She is alert and oriented to person, place, and time.  Psychiatric: She has a normal mood and affect. Her behavior is normal.       Assessment & Plan:

## 2011-09-10 NOTE — Patient Instructions (Signed)
Please complete the following lab tests before your next follow up appointment:  BMET, A1c - 250.02 

## 2011-09-25 ENCOUNTER — Other Ambulatory Visit (INDEPENDENT_AMBULATORY_CARE_PROVIDER_SITE_OTHER): Payer: Medicare Other

## 2011-09-25 DIAGNOSIS — I10 Essential (primary) hypertension: Secondary | ICD-10-CM

## 2011-09-25 LAB — LIPID PANEL
Cholesterol: 167 mg/dL (ref 0–200)
HDL: 44.6 mg/dL (ref >39.00)
Total CHOL/HDL Ratio: 4
Triglycerides: 257 mg/dL — ABNORMAL HIGH (ref 0.0–149.0)
VLDL: 51.4 mg/dL — ABNORMAL HIGH (ref 0.0–40.0)

## 2011-09-25 LAB — BASIC METABOLIC PANEL
CO2: 27 mEq/L (ref 19–32)
Chloride: 103 mEq/L (ref 96–112)
Potassium: 4.6 mEq/L (ref 3.5–5.1)
Sodium: 138 mEq/L (ref 135–145)

## 2011-09-25 LAB — HEMOGLOBIN A1C: Hgb A1c MFr Bld: 7.4 % — ABNORMAL HIGH (ref 4.6–6.5)

## 2011-09-27 ENCOUNTER — Telehealth: Payer: Self-pay | Admitting: Internal Medicine

## 2011-09-27 NOTE — Telephone Encounter (Signed)
Pt aware.

## 2011-09-27 NOTE — Telephone Encounter (Signed)
Pt req call back re: lab results today. Pls call. Pt is very concerned.

## 2011-09-27 NOTE — Telephone Encounter (Signed)
Call pt - electrolytes and kidney function are stable.  A1c improved to 7.4  Triglycerides are mildly elevated.

## 2011-09-27 NOTE — Telephone Encounter (Signed)
Patient called stating that her anxiety level is increasing as she is worrying about the outcome of her lab results and she would like a call back today with results to put her mind at ease. Please advise/assist.

## 2011-10-02 ENCOUNTER — Ambulatory Visit (INDEPENDENT_AMBULATORY_CARE_PROVIDER_SITE_OTHER): Payer: Medicare Other | Admitting: Internal Medicine

## 2011-10-02 ENCOUNTER — Encounter: Payer: Self-pay | Admitting: Internal Medicine

## 2011-10-02 VITALS — BP 140/80 | HR 83 | Temp 98.6°F | Wt 272.0 lb

## 2011-10-02 DIAGNOSIS — E119 Type 2 diabetes mellitus without complications: Secondary | ICD-10-CM

## 2011-10-02 DIAGNOSIS — I10 Essential (primary) hypertension: Secondary | ICD-10-CM

## 2011-10-02 NOTE — Assessment & Plan Note (Signed)
Blood sugars have improved with higher dose of mealtime insulin before supper. Patient encouraged to make sure she has less than 25-30 g of carb with her evening meal.  Lab Results  Component Value Date   HGBA1C 7.4* 09/25/2011

## 2011-10-02 NOTE — Progress Notes (Signed)
Subjective:    Patient ID: [name], female    DOB: [birth year], 61 y.o.   MRN: [id number]  HPI  61 year old white female with history of type 2 diabetes, hypertension, and anxiety for routine followup. Patient reports her blood sugars have improved. She increased her evening dose of mealtime insulin to 50 units. Her blood sugars are usually higher when she consumes a higher amount of starchy foods with dinner especially potatoes.  Anxiety - she continues to work with her therapist.  Review of Systems Mild weight gain  Past Medical History  Diagnosis Date  . Pernicious anemia   . Diabetes mellitus, type 2   . Hyperlipemia   . Anxiety   . Depression   . GERD (gastroesophageal reflux disease)   . Allergic rhinitis   . IBS (irritable bowel syndrome)   . Diverticulosis of colon   . Iron deficiency anemia   . Benign essential tremor   . CRI (chronic renal insufficiency)     History   Social History  . Marital Status: Married    Spouse Name: N/A    Number of Children: N/A  . Years of Education: N/A   Occupational History  . Not on file.   Social History Main Topics  . Smoking status: Never Smoker   . Smokeless tobacco: Never Used  . Alcohol Use: No  . Drug Use: Not on file  . Sexually Active: Not on file   Other Topics Concern  . Not on file   Social History Narrative  . No narrative on file    Past Surgical History  Procedure Date  . Abdominal hysterectomy   . Cesarean section   . Tonsillectomy     Family History  Problem Relation Age of Onset  . Heart disease    . Diabetes    . Colitis      ulcerative    Allergies  Allergen Reactions  . Codeine   . Demerol   . Meperidine Hcl   . Niacin-Lovastatin Er     Current Outpatient Prescriptions on File Prior to Visit  Medication Sig Dispense Refill  . ALPRAZolam (XANAX) 1 MG tablet Take 1 mg by mouth 4 (four) times daily.       Marland Kitchen amLODipine (NORVASC) 2.5 MG tablet Take 1 tablet (2.5 mg total)  by mouth daily.  30 tablet  3  . asenapine (SAPHRIS) 5 MG SUBL Place 1 tablet (5 mg total) under the tongue at bedtime.  30 tablet  0  . atorvastatin (LIPITOR) 40 MG tablet Take 1 tablet (40 mg total) by mouth daily.  90 tablet  1  . Blood Glucose Monitoring Suppl (ONE TOUCH ULTRA SYSTEM KIT) W/DEVICE KIT Use as instructed to check blood sugars four times a day ( Dx Code 250.020)  1 each  0  . busPIRone (BUSPAR) 15 MG tablet Take 30 mg by mouth 3 (three) times daily.       . Calcium Carbonate-Vitamin D (CALCIUM 600+D) 600-400 MG-UNIT per tablet Take 1 tablet by mouth daily.        . cyanocobalamin 1000 MCG/ML injection Inject into the muscle every 30 (thirty) days.        Marland Kitchen estradiol (VIVELLE-DOT) 0.1 MG/24HR Place 1 patch onto the skin 2 (two) times a week.      . ferrous sulfate 325 (65 FE) MG tablet Take 325 mg by mouth daily.        . furosemide (LASIX) 20 MG tablet Take 1 tablet (20  mg total) by mouth 2 (two) times daily.  180 tablet  1  . insulin glargine (LANTUS SOLOSTAR) 100 UNIT/ML injection Inject 80 Units into the skin at bedtime.  5 pen  5  . insulin lispro (HUMALOG KWIKPEN) 100 UNIT/ML injection 25 units before breakfast, 25 units before lunch, and 50 units before dinner daily  15 mL  5  . Insulin Pen Needle (BD ULTRA-FINE PEN NEEDLES) 29G X 12.7MM MISC Use three times a day for insulin injection  400 each  2  . labetalol (NORMODYNE) 300 MG tablet Take 1 tablet (300 mg total) by mouth 2 (two) times daily.  180 tablet  1  . mirtazapine (REMERON) 15 MG tablet Take 15 mg by mouth at bedtime.       . Omega-3 Fatty Acids (FISH OIL) 1000 MG CAPS Take 4 capsules daily.      . ONE TOUCH ULTRA TEST test strip USE 4 TIMES DAILY TO CHECK BLOOD SUGARS  300 each  3  . PARoxetine (PAXIL) 30 MG tablet Take 1 tablet (30 mg total) by mouth 2 (two) times daily.  60 tablet  3  . primidone (MYSOLINE) 250 MG tablet 1 tablet by mouth every morning and 3 tablets by mouth at bedtime      . valsartan (DIOVAN)  160 MG tablet Take 1 tablet (160 mg total) by mouth daily.  90 tablet  1    BP 140/80  Pulse 83  Temp 98.6 F (37 C) (Oral)  Wt 272 lb (123.378 kg)       Objective:   Physical Exam  Constitutional: She is oriented to person, place, and time. She appears well-developed and well-nourished.  Cardiovascular: Normal rate, regular rhythm and normal heart sounds.   Pulmonary/Chest: Effort normal and breath sounds normal. She has no wheezes.  Neurological: She is alert and oriented to person, place, and time.  Skin: Skin is dry.  Psychiatric:       anxious          Assessment & Plan:

## 2011-10-02 NOTE — Assessment & Plan Note (Signed)
BP and Cr are stable.   No change in BP medication regimen.  BP: 140/80 mmHg  Lab Results  Component Value Date   CREATININE 1.6* 09/25/2011

## 2011-10-02 NOTE — Patient Instructions (Addendum)
Please complete the following lab tests before your next follow up appointment: BMET - 401.9 A1c - 250.00 

## 2011-10-07 ENCOUNTER — Ambulatory Visit: Payer: Medicare Other | Admitting: Internal Medicine

## 2011-10-23 ENCOUNTER — Ambulatory Visit: Payer: Medicare Other | Admitting: Internal Medicine

## 2011-10-24 ENCOUNTER — Ambulatory Visit (INDEPENDENT_AMBULATORY_CARE_PROVIDER_SITE_OTHER): Payer: Medicare Other | Admitting: Internal Medicine

## 2011-10-24 DIAGNOSIS — E538 Deficiency of other specified B group vitamins: Secondary | ICD-10-CM

## 2011-10-24 MED ORDER — CYANOCOBALAMIN 1000 MCG/ML IJ SOLN
1000.0000 ug | Freq: Once | INTRAMUSCULAR | Status: AC
  Administered 2011-10-24: 1000 ug via INTRAMUSCULAR

## 2011-10-28 ENCOUNTER — Other Ambulatory Visit: Payer: Self-pay | Admitting: Internal Medicine

## 2011-10-28 ENCOUNTER — Telehealth: Payer: Self-pay | Admitting: Family Medicine

## 2011-10-28 NOTE — Telephone Encounter (Signed)
Pt calling because she states she needs and "urgent" refill on her Humalog insulin. She is on her last pen. She will be out tonight. Pt uses [facility] on [address] in [place].

## 2011-10-28 NOTE — Telephone Encounter (Signed)
rx was sent in electronically

## 2011-10-29 ENCOUNTER — Other Ambulatory Visit: Payer: Self-pay | Admitting: Internal Medicine

## 2011-11-12 ENCOUNTER — Telehealth: Payer: Self-pay | Admitting: Internal Medicine

## 2011-11-12 NOTE — Telephone Encounter (Signed)
Patient called stating that the MD increased her dosage of humalog and did not increase it with the pharmacy so she has ran out and they will not refill it she is currently taking 30,30, and 50. Please call this into [facility] [address] in [place]  Ph. [phone]. Patient is running out of this.

## 2011-11-13 MED ORDER — INSULIN LISPRO 100 UNIT/ML ~~LOC~~ SOLN: SUBCUTANEOUS | Status: DC

## 2011-11-13 NOTE — Telephone Encounter (Signed)
rx corrected and sent in to pharmacy electronically, pt aware

## 2011-11-25 ENCOUNTER — Ambulatory Visit: Payer: Medicare Other | Admitting: Internal Medicine

## 2011-11-26 ENCOUNTER — Ambulatory Visit: Payer: Medicare Other | Admitting: Internal Medicine

## 2011-11-27 ENCOUNTER — Ambulatory Visit (INDEPENDENT_AMBULATORY_CARE_PROVIDER_SITE_OTHER): Payer: Medicare Other | Admitting: Internal Medicine

## 2011-11-27 DIAGNOSIS — E538 Deficiency of other specified B group vitamins: Secondary | ICD-10-CM

## 2011-11-27 MED ORDER — CYANOCOBALAMIN 1000 MCG/ML IJ SOLN
1000.0000 ug | Freq: Once | INTRAMUSCULAR | Status: AC
  Administered 2011-11-27: 1000 ug via INTRAMUSCULAR

## 2011-12-04 ENCOUNTER — Other Ambulatory Visit: Payer: Self-pay | Admitting: Internal Medicine

## 2011-12-05 ENCOUNTER — Other Ambulatory Visit: Payer: Self-pay | Admitting: Internal Medicine

## 2011-12-05 DIAGNOSIS — E119 Type 2 diabetes mellitus without complications: Secondary | ICD-10-CM

## 2011-12-05 MED ORDER — INSULIN PEN NEEDLE 29G X 12.7MM MISC: Status: DC

## 2011-12-05 NOTE — Telephone Encounter (Signed)
Pt needs refill on BD insulin needles, 29G X 12.90m. Pharm [facility] [address]

## 2011-12-05 NOTE — Telephone Encounter (Signed)
rx sent in electronically 

## 2011-12-23 ENCOUNTER — Telehealth: Payer: Self-pay | Admitting: Family Medicine

## 2011-12-23 MED ORDER — INSULIN GLARGINE 100 UNIT/ML ~~LOC~~ SOLN: 80.0000 [IU] | Freq: Every day | SUBCUTANEOUS | Status: DC

## 2011-12-23 NOTE — Telephone Encounter (Signed)
rx sent in electronically 

## 2011-12-23 NOTE — Telephone Encounter (Signed)
Pt needs refills on insulin glargine (LANTUS SOLOSTAR) 100 UNIT/ML injection sent to [facility] in [place]. She is totally out and next dose is due tonight at HS.

## 2011-12-25 ENCOUNTER — Other Ambulatory Visit: Payer: Medicare Other

## 2011-12-26 ENCOUNTER — Other Ambulatory Visit (INDEPENDENT_AMBULATORY_CARE_PROVIDER_SITE_OTHER): Payer: Medicare Other

## 2011-12-26 DIAGNOSIS — D509 Iron deficiency anemia, unspecified: Secondary | ICD-10-CM

## 2011-12-26 DIAGNOSIS — E119 Type 2 diabetes mellitus without complications: Secondary | ICD-10-CM

## 2011-12-26 DIAGNOSIS — D518 Other vitamin B12 deficiency anemias: Secondary | ICD-10-CM

## 2011-12-26 DIAGNOSIS — D519 Vitamin B12 deficiency anemia, unspecified: Secondary | ICD-10-CM

## 2011-12-26 DIAGNOSIS — Z20820 Contact with and (suspected) exposure to varicella: Secondary | ICD-10-CM

## 2011-12-26 LAB — BASIC METABOLIC PANEL
BUN: 28 mg/dL — ABNORMAL HIGH (ref 6–23)
GFR: 34.52 mL/min — ABNORMAL LOW (ref >60.00)
Glucose, Bld: 180 mg/dL — ABNORMAL HIGH (ref 70–99)
Potassium: 4.3 mEq/L (ref 3.5–5.1)

## 2011-12-26 MED ORDER — CYANOCOBALAMIN 1000 MCG/ML IJ SOLN
1000.0000 ug | INTRAMUSCULAR | Status: AC
  Administered 2011-12-26: 1000 ug via INTRAMUSCULAR

## 2011-12-26 NOTE — Addendum Note (Signed)
Addended by: Joyce Gross R on: 12/26/2011 01:30 PM   Modules accepted: Orders

## 2011-12-27 LAB — VARICELLA ZOSTER ANTIBODY, IGG: Varicella IgG: 4.55 {ISR} — ABNORMAL HIGH (ref <0.90)

## 2012-01-01 ENCOUNTER — Ambulatory Visit: Payer: Medicare Other | Admitting: Internal Medicine

## 2012-01-02 ENCOUNTER — Telehealth: Payer: Self-pay | Admitting: Internal Medicine

## 2012-01-02 NOTE — Telephone Encounter (Signed)
Left message for pt to call back  °

## 2012-01-02 NOTE — Telephone Encounter (Signed)
Pt would like blood work results °

## 2012-01-02 NOTE — Telephone Encounter (Signed)
Cr is stable at 1.6.  A1c slightly higher at 7.6.  Varicella titers elevated (indicates she has had chickenpox in the past)

## 2012-01-02 NOTE — Telephone Encounter (Signed)
Pt aware.

## 2012-01-03 ENCOUNTER — Ambulatory Visit (INDEPENDENT_AMBULATORY_CARE_PROVIDER_SITE_OTHER): Payer: Medicare Other | Admitting: Internal Medicine

## 2012-01-03 ENCOUNTER — Encounter: Payer: Self-pay | Admitting: Internal Medicine

## 2012-01-03 VITALS — BP 132/70 | HR 80 | Temp 98.1°F | Wt 273.0 lb

## 2012-01-03 DIAGNOSIS — I10 Essential (primary) hypertension: Secondary | ICD-10-CM

## 2012-01-03 DIAGNOSIS — IMO0001 Reserved for inherently not codable concepts without codable children: Secondary | ICD-10-CM

## 2012-01-03 DIAGNOSIS — N259 Disorder resulting from impaired renal tubular function, unspecified: Secondary | ICD-10-CM

## 2012-01-03 MED ORDER — INSULIN GLARGINE 100 UNIT/ML ~~LOC~~ SOLN: 80.0000 [IU] | Freq: Every day | SUBCUTANEOUS | Status: DC

## 2012-01-03 MED ORDER — INSULIN LISPRO 100 UNIT/ML ~~LOC~~ SOLN: SUBCUTANEOUS | Status: DC

## 2012-01-03 NOTE — Assessment & Plan Note (Signed)
Well controlled.  BP: 132/70 mmHg  No change in medication

## 2012-01-03 NOTE — Assessment & Plan Note (Addendum)
Blood sugar control slightly worse. This is to do transient dietary change. Continue same dose of Lantus and Humalog. Patient understands if she starts an exercise program in the future, she may need to decrease both her Lantus and mealtime insulin dose.  Monitor A1c.  Patient reports she had normal diabetic eye exam in mid October by Dr. Brigitte Pulse.

## 2012-01-03 NOTE — Patient Instructions (Addendum)
Please complete the following lab tests before your next follow up appointment: BMET, A1c, microalbumin/Cr ratio - 250.02

## 2012-01-03 NOTE — Progress Notes (Signed)
Subjective:    Patient ID: [name], female    DOB: Oct 02, 1950, 61 y.o.   MRN: [id number]  HPI  60 year old white female with uncontrolled type 2 diabetes, hypertension and depression for routine followup. Unfortunately patient recently suffered the loss of her mother. Her mother had advanced dementia and died after suffering fall. She subsequently developed pneumonia and renal failure.  Her mother was residing in a nursing home, so patient was visiting her frequently. She was not following her typical diet. Her blood sugars have been slightly higher than usual.  She denies any episodes of severe hypoglycemia.  Review of Systems Negative for chest pain or shortness of breath    Past Medical History  Diagnosis Date  . Pernicious anemia   . Diabetes mellitus, type 2   . Hyperlipemia   . Anxiety   . Depression   . GERD (gastroesophageal reflux disease)   . Allergic rhinitis   . IBS (irritable bowel syndrome)   . Diverticulosis of colon   . Iron deficiency anemia   . Benign essential tremor   . CRI (chronic renal insufficiency)     History   Social History  . Marital Status: Married    Spouse Name: N/A    Number of Children: N/A  . Years of Education: N/A   Occupational History  . Not on file.   Social History Main Topics  . Smoking status: Never Smoker   . Smokeless tobacco: Never Used  . Alcohol Use: No  . Drug Use: Not on file  . Sexually Active: Not on file   Other Topics Concern  . Not on file   Social History Narrative  . No narrative on file    Past Surgical History  Procedure Date  . Abdominal hysterectomy   . Cesarean section   . Tonsillectomy     Family History  Problem Relation Age of Onset  . Heart disease    . Diabetes    . Colitis      ulcerative    Allergies  Allergen Reactions  . Codeine   . Demerol   . Meperidine Hcl   . Niacin-Lovastatin Er     Current Outpatient Prescriptions on File Prior to Visit  Medication Sig  Dispense Refill  . ALPRAZolam (XANAX) 1 MG tablet Take 1 mg by mouth 4 (four) times daily.       Marland Kitchen amLODipine (NORVASC) 2.5 MG tablet take 1 tablet by mouth daily  30 tablet  3  . asenapine (SAPHRIS) 5 MG SUBL Place 1 tablet (5 mg total) under the tongue at bedtime.  30 tablet  0  . atorvastatin (LIPITOR) 40 MG tablet Take 1 tablet (40 mg total) by mouth daily.  90 tablet  1  . Blood Glucose Monitoring Suppl (ONE TOUCH ULTRA SYSTEM KIT) W/DEVICE KIT Use as instructed to check blood sugars four times a day ( Dx Code 250.020)  1 each  0  . busPIRone (BUSPAR) 15 MG tablet Take 30 mg by mouth 3 (three) times daily.       . Calcium Carbonate-Vitamin D (CALCIUM 600+D) 600-400 MG-UNIT per tablet Take 1 tablet by mouth daily.        . cyanocobalamin 1000 MCG/ML injection Inject into the muscle every 30 (thirty) days.        Marland Kitchen estradiol (VIVELLE-DOT) 0.1 MG/24HR Place 1 patch onto the skin 2 (two) times a week.      . ferrous sulfate 325 (65 FE) MG tablet Take 325  mg by mouth daily.        . furosemide (LASIX) 20 MG tablet Take 1 tablet (20 mg total) by mouth 2 (two) times daily.  180 tablet  1  . insulin lispro (HUMALOG KWIKPEN) 100 UNIT/ML injection Inject 30 units before breakfast, 30 units before lunch, and 50 units before dinner  15 mL  3  . Insulin Pen Needle (BD ULTRA-FINE PEN NEEDLES) 29G X 12.7MM MISC Use three times a day for insulin injection  400 each  2  . labetalol (NORMODYNE) 300 MG tablet take 1 tablet by mouth twice a day  30 tablet  3  . mirtazapine (REMERON) 15 MG tablet Take 15 mg by mouth at bedtime.       . Omega-3 Fatty Acids (FISH OIL) 1000 MG CAPS Take 4 capsules daily.      . ONE TOUCH ULTRA TEST test strip USE 4 TIMES DAILY TO CHECK BLOOD SUGARS  300 each  3  . PARoxetine (PAXIL) 30 MG tablet Take 1 tablet (30 mg total) by mouth 2 (two) times daily.  60 tablet  3  . primidone (MYSOLINE) 250 MG tablet 1 tablet by mouth every morning and 3 tablets by mouth at bedtime      .  valsartan (DIOVAN) 160 MG tablet Take 1 tablet (160 mg total) by mouth daily.  90 tablet  1    BP 132/70  Pulse 80  Temp 98.1 F (36.7 C) (Oral)  Wt 273 lb (123.832 kg)    Objective:   Physical Exam  Constitutional: She is oriented to person, place, and time. She appears well-developed and well-nourished.  Cardiovascular: Normal rate, regular rhythm and normal heart sounds.   Pulmonary/Chest: Effort normal and breath sounds normal. She has no wheezes.  Neurological: She is alert and oriented to person, place, and time.  Skin: Skin is warm and dry.  Psychiatric: She has a normal mood and affect. Her behavior is normal.          Assessment & Plan:

## 2012-01-03 NOTE — Assessment & Plan Note (Signed)
Cr is stable.   Lab Results  Component Value Date   CREATININE 1.6* 12/26/2011   Lab Results  Component Value Date   NA 140 12/26/2011   K 4.3 12/26/2011   CL 104 12/26/2011   CO2 27 12/26/2011

## 2012-01-06 ENCOUNTER — Ambulatory Visit (INDEPENDENT_AMBULATORY_CARE_PROVIDER_SITE_OTHER): Payer: Medicare Other | Admitting: Internal Medicine

## 2012-01-06 ENCOUNTER — Encounter: Payer: Self-pay | Admitting: Internal Medicine

## 2012-01-06 VITALS — BP 150/80 | HR 89 | Temp 98.5°F | Resp 20 | Wt 276.0 lb

## 2012-01-06 DIAGNOSIS — J069 Acute upper respiratory infection, unspecified: Secondary | ICD-10-CM

## 2012-01-06 DIAGNOSIS — I10 Essential (primary) hypertension: Secondary | ICD-10-CM

## 2012-01-06 MED ORDER — BENZONATATE 200 MG PO CAPS: 200.0000 mg | ORAL_CAPSULE | Freq: Two times a day (BID) | ORAL | Status: DC | PRN

## 2012-01-06 NOTE — Patient Instructions (Signed)
Acute bronchitis symptoms for less than 10 days are generally not helped by antibiotics.  Take over-the-counter expectorants and cough medications such as  Mucinex DM.  Call if there is no improvement in 5 to 7 days or if he developed worsening cough, fever, or new symptoms, such as shortness of breath or chest pain.  Tessalon Perles one every 6-8 hours as needed for cough

## 2012-01-06 NOTE — Progress Notes (Signed)
Subjective:    Patient ID: [name], female    DOB: 11-28-1950, 61 y.o.   MRN: [id number]  HPI  61 year old patient who is seen today with a chief complaint of cough congestion. She was seen by her PCP 3 days ago for her quarterly followup over the weekend has developed a significant refractory cough that has interfered with sleep. She's been under much stress and lost her mother approximately 10 days ago. She has hypertension and diabetes which has been stable. No fever  Past Medical History  Diagnosis Date  . Pernicious anemia   . Diabetes mellitus, type 2   . Hyperlipemia   . Anxiety   . Depression   . GERD (gastroesophageal reflux disease)   . Allergic rhinitis   . IBS (irritable bowel syndrome)   . Diverticulosis of colon   . Iron deficiency anemia   . Benign essential tremor   . CRI (chronic renal insufficiency)     History   Social History  . Marital Status: Married    Spouse Name: N/A    Number of Children: N/A  . Years of Education: N/A   Occupational History  . Not on file.   Social History Main Topics  . Smoking status: Never Smoker   . Smokeless tobacco: Never Used  . Alcohol Use: No  . Drug Use: Not on file  . Sexually Active: Not on file   Other Topics Concern  . Not on file   Social History Narrative  . No narrative on file    Past Surgical History  Procedure Date  . Abdominal hysterectomy   . Cesarean section   . Tonsillectomy     Family History  Problem Relation Age of Onset  . Heart disease    . Diabetes    . Colitis      ulcerative  . Dementia Mother     Allergies  Allergen Reactions  . Codeine   . Demerol   . Meperidine Hcl   . Niacin-Lovastatin Er     Current Outpatient Prescriptions on File Prior to Visit  Medication Sig Dispense Refill  . ALPRAZolam (XANAX) 1 MG tablet Take 1 mg by mouth 4 (four) times daily.       Marland Kitchen amLODipine (NORVASC) 2.5 MG tablet take 1 tablet by mouth daily  30 tablet  3  . asenapine  (SAPHRIS) 5 MG SUBL Place 1 tablet (5 mg total) under the tongue at bedtime.  30 tablet  0  . atorvastatin (LIPITOR) 40 MG tablet Take 1 tablet (40 mg total) by mouth daily.  90 tablet  1  . Blood Glucose Monitoring Suppl (ONE TOUCH ULTRA SYSTEM KIT) W/DEVICE KIT Use as instructed to check blood sugars four times a day ( Dx Code 250.020)  1 each  0  . busPIRone (BUSPAR) 15 MG tablet Take 30 mg by mouth 3 (three) times daily.       . Calcium Carbonate-Vitamin D (CALCIUM 600+D) 600-400 MG-UNIT per tablet Take 1 tablet by mouth daily.        . cyanocobalamin 1000 MCG/ML injection Inject into the muscle every 30 (thirty) days.        Marland Kitchen estradiol (VIVELLE-DOT) 0.1 MG/24HR Place 1 patch onto the skin 2 (two) times a week.      . ferrous sulfate 325 (65 FE) MG tablet Take 325 mg by mouth daily.        . furosemide (LASIX) 20 MG tablet Take 1 tablet (20 mg total) by  mouth 2 (two) times daily.  180 tablet  1  . insulin glargine (LANTUS SOLOSTAR) 100 UNIT/ML injection Inject 80 Units into the skin at bedtime.  5 pen  5  . insulin lispro (HUMALOG KWIKPEN) 100 UNIT/ML injection Inject 30 units before breakfast, 30 units before lunch, and 50 units before dinner  15 mL  3  . Insulin Pen Needle (BD ULTRA-FINE PEN NEEDLES) 29G X 12.7MM MISC Use three times a day for insulin injection  400 each  2  . labetalol (NORMODYNE) 300 MG tablet take 1 tablet by mouth twice a day  30 tablet  3  . mirtazapine (REMERON) 15 MG tablet Take 15 mg by mouth at bedtime.       . Omega-3 Fatty Acids (FISH OIL) 1000 MG CAPS Take 4 capsules daily.      . ONE TOUCH ULTRA TEST test strip USE 4 TIMES DAILY TO CHECK BLOOD SUGARS  300 each  3  . PARoxetine (PAXIL) 30 MG tablet Take 1 tablet (30 mg total) by mouth 2 (two) times daily.  60 tablet  3  . primidone (MYSOLINE) 250 MG tablet 1 tablet by mouth every morning and 3 tablets by mouth at bedtime      . valsartan (DIOVAN) 160 MG tablet Take 1 tablet (160 mg total) by mouth daily.  90  tablet  1    BP 150/80  Pulse 89  Temp 98.5 F (36.9 C) (Oral)  Resp 20  Wt 276 lb (125.193 kg)  SpO2 94%        Review of Systems  Constitutional: Negative.   HENT: Positive for congestion. Negative for hearing loss, sore throat, rhinorrhea, dental problem, sinus pressure and tinnitus.   Eyes: Negative for pain, discharge and visual disturbance.  Respiratory: Positive for cough. Negative for shortness of breath.   Cardiovascular: Negative for chest pain, palpitations and leg swelling.  Gastrointestinal: Negative for nausea, vomiting, abdominal pain, diarrhea, constipation, blood in stool and abdominal distention.  Genitourinary: Negative for dysuria, urgency, frequency, hematuria, flank pain, vaginal bleeding, vaginal discharge, difficulty urinating, vaginal pain and pelvic pain.  Musculoskeletal: Negative for joint swelling, arthralgias and gait problem.  Skin: Negative for rash.  Neurological: Negative for dizziness, syncope, speech difficulty, weakness, numbness and headaches.  Hematological: Negative for adenopathy.  Psychiatric/Behavioral: Negative for behavioral problems, dysphoric mood and agitation. The patient is not nervous/anxious.        Objective:   Physical Exam  Constitutional: She is oriented to person, place, and time. She appears well-developed and well-nourished.  HENT:  Head: Normocephalic.  Right Ear: External ear normal.  Left Ear: External ear normal.  Mouth/Throat: Oropharynx is clear and moist.  Eyes: Conjunctivae normal and EOM are normal. Pupils are equal, round, and reactive to light.  Neck: Normal range of motion. Neck supple. No thyromegaly present.  Cardiovascular: Normal rate, regular rhythm, normal heart sounds and intact distal pulses.   Pulmonary/Chest: Effort normal and breath sounds normal.  Abdominal: Soft. Bowel sounds are normal. She exhibits no mass. There is no tenderness.  Musculoskeletal: Normal range of motion.   Lymphadenopathy:    She has no cervical adenopathy.  Neurological: She is alert and oriented to person, place, and time.  Skin: Skin is warm and dry. No rash noted.  Psychiatric: She has a normal mood and affect. Her behavior is normal.          Assessment & Plan:  Viral URI with cough.  We'll treat with Mucinex DM. In view  of her narcotic allergy we'll also treat with Tessalon Perles Diabetes Hypertension

## 2012-01-10 ENCOUNTER — Telehealth: Payer: Self-pay | Admitting: Internal Medicine

## 2012-01-10 ENCOUNTER — Other Ambulatory Visit: Payer: Self-pay | Admitting: *Deleted

## 2012-01-10 DIAGNOSIS — E119 Type 2 diabetes mellitus without complications: Secondary | ICD-10-CM

## 2012-01-10 MED ORDER — INSULIN PEN NEEDLE 29G X 12.7MM MISC: Status: DC

## 2012-01-10 MED ORDER — AMOXICILLIN 500 MG PO CAPS: 500.0000 mg | ORAL_CAPSULE | Freq: Two times a day (BID) | ORAL | Status: DC

## 2012-01-10 NOTE — Telephone Encounter (Signed)
rx sent in electronically, pt aware 

## 2012-01-10 NOTE — Telephone Encounter (Signed)
Pt states she is not much better. No fever. Lots of discharge from nose and cough. Pt would like to know if MD could call her in an antibiotic. Pt has only taken Mucinnex DM. Pharm: [facility].

## 2012-01-10 NOTE — Telephone Encounter (Signed)
Call in amoxicillin 500 mg #14 one po bid.  I suggest OV next week if she doesn't feel better

## 2012-01-27 ENCOUNTER — Ambulatory Visit: Payer: Medicare Other | Admitting: Internal Medicine

## 2012-01-28 ENCOUNTER — Ambulatory Visit (INDEPENDENT_AMBULATORY_CARE_PROVIDER_SITE_OTHER): Payer: Medicare Other | Admitting: Internal Medicine

## 2012-01-28 DIAGNOSIS — E538 Deficiency of other specified B group vitamins: Secondary | ICD-10-CM

## 2012-01-28 MED ORDER — CYANOCOBALAMIN 1000 MCG/ML IJ SOLN
1000.0000 ug | Freq: Once | INTRAMUSCULAR | Status: AC
  Administered 2012-01-28: 1000 ug via INTRAMUSCULAR

## 2012-02-04 ENCOUNTER — Other Ambulatory Visit: Payer: Self-pay | Admitting: Internal Medicine

## 2012-02-12 ENCOUNTER — Other Ambulatory Visit: Payer: Self-pay | Admitting: Internal Medicine

## 2012-02-13 ENCOUNTER — Other Ambulatory Visit: Payer: Self-pay | Admitting: Internal Medicine

## 2012-02-25 ENCOUNTER — Ambulatory Visit (INDEPENDENT_AMBULATORY_CARE_PROVIDER_SITE_OTHER): Payer: Medicare Other | Admitting: Internal Medicine

## 2012-02-25 DIAGNOSIS — E538 Deficiency of other specified B group vitamins: Secondary | ICD-10-CM

## 2012-02-25 MED ORDER — CYANOCOBALAMIN 1000 MCG/ML IJ SOLN
1000.0000 ug | Freq: Once | INTRAMUSCULAR | Status: AC
  Administered 2012-02-25: 1000 ug via INTRAMUSCULAR

## 2012-03-07 ENCOUNTER — Other Ambulatory Visit: Payer: Self-pay | Admitting: Internal Medicine

## 2012-03-26 ENCOUNTER — Other Ambulatory Visit (INDEPENDENT_AMBULATORY_CARE_PROVIDER_SITE_OTHER): Payer: Medicare Other

## 2012-03-26 LAB — BASIC METABOLIC PANEL
CO2: 26 mEq/L (ref 19–32)
Calcium: 8.8 mg/dL (ref 8.4–10.5)
Creatinine, Ser: 1.6 mg/dL — ABNORMAL HIGH (ref 0.4–1.2)

## 2012-04-02 ENCOUNTER — Encounter: Payer: Self-pay | Admitting: Internal Medicine

## 2012-04-02 ENCOUNTER — Ambulatory Visit (INDEPENDENT_AMBULATORY_CARE_PROVIDER_SITE_OTHER): Payer: Medicare Other | Admitting: Internal Medicine

## 2012-04-02 VITALS — BP 124/80 | HR 72 | Temp 98.5°F | Wt 270.0 lb

## 2012-04-02 DIAGNOSIS — I10 Essential (primary) hypertension: Secondary | ICD-10-CM

## 2012-04-02 MED ORDER — AMLODIPINE BESYLATE 2.5 MG PO TABS: 2.5000 mg | ORAL_TABLET | Freq: Every day | ORAL | Status: DC

## 2012-04-02 MED ORDER — LABETALOL HCL 300 MG PO TABS: 300.0000 mg | ORAL_TABLET | Freq: Two times a day (BID) | ORAL | Status: DC

## 2012-04-02 MED ORDER — INSULIN LISPRO 100 UNIT/ML ~~LOC~~ SOLN: SUBCUTANEOUS | Status: DC

## 2012-04-02 NOTE — Progress Notes (Signed)
Subjective:    Patient ID: [name], female    DOB: Sep 02, 1950, 62 y.o.   MRN: [id number]  HPI  62 year old white female for followup regarding type 2 diabetes, hypertension and depression. Patient reports infrequent hypoglycemia. She has lost some weight.  Hypoglycemia occurs usually after lunch after eating low-carb meal.  She continues to follow with psychologist for depression/anxiety. Her symptoms are stable.  Chronic renal insufficiency-7 creatinine is stable at 1.6.  Review of Systems Negative for chest pain.  Negative for paresthesias  Past Medical History  Diagnosis Date  . Pernicious anemia   . Diabetes mellitus, type 2   . Hyperlipemia   . Anxiety   . Depression   . GERD (gastroesophageal reflux disease)   . Allergic rhinitis   . IBS (irritable bowel syndrome)   . Diverticulosis of colon   . Iron deficiency anemia   . Benign essential tremor   . CRI (chronic renal insufficiency)     History   Social History  . Marital Status: Married    Spouse Name: N/A    Number of Children: N/A  . Years of Education: N/A   Occupational History  . Not on file.   Social History Main Topics  . Smoking status: Never Smoker   . Smokeless tobacco: Never Used  . Alcohol Use: No  . Drug Use: Not on file  . Sexually Active: Not on file   Other Topics Concern  . Not on file   Social History Narrative  . No narrative on file    Past Surgical History  Procedure Laterality Date  . Abdominal hysterectomy    . Cesarean section    . Tonsillectomy      Family History  Problem Relation Age of Onset  . Heart disease    . Diabetes    . Colitis      ulcerative  . Dementia Mother     Allergies  Allergen Reactions  . Codeine   . Demerol   . Meperidine Hcl   . Niacin-Lovastatin Er     Current Outpatient Prescriptions on File Prior to Visit  Medication Sig Dispense Refill  . ALPRAZolam (XANAX) 1 MG tablet Take 1 mg by mouth 4 (four) times daily.       Marland Kitchen  asenapine (SAPHRIS) 5 MG SUBL Place 1 tablet (5 mg total) under the tongue at bedtime.  30 tablet  0  . atorvastatin (LIPITOR) 40 MG tablet take 1 tablet by mouth once daily  30 tablet  5  . benzonatate (TESSALON) 200 MG capsule Take 1 capsule (200 mg total) by mouth 2 (two) times daily as needed for cough.  20 capsule  0  . Blood Glucose Monitoring Suppl (ONE TOUCH ULTRA SYSTEM KIT) W/DEVICE KIT Use as instructed to check blood sugars four times a day ( Dx Code 250.020)  1 each  0  . busPIRone (BUSPAR) 15 MG tablet Take 30 mg by mouth 3 (three) times daily.       . Calcium Carbonate-Vitamin D (CALCIUM 600+D) 600-400 MG-UNIT per tablet Take 1 tablet by mouth daily.        . cyanocobalamin 1000 MCG/ML injection Inject into the muscle every 30 (thirty) days.        Marland Kitchen DIOVAN 160 MG tablet take 1 tablet by mouth once daily  30 each  5  . estradiol (VIVELLE-DOT) 0.1 MG/24HR Place 1 patch onto the skin 2 (two) times a week.      . ferrous sulfate  325 (65 FE) MG tablet Take 325 mg by mouth daily.        . furosemide (LASIX) 20 MG tablet take 1 tablet by mouth twice a day  30 tablet  5  . insulin glargine (LANTUS SOLOSTAR) 100 UNIT/ML injection Inject 80 Units into the skin at bedtime.  5 pen  5  . Insulin Pen Needle (BD ULTRA-FINE PEN NEEDLES) 29G X 12.7MM MISC Use five times a day for insulin injection  400 each  2  . mirtazapine (REMERON) 15 MG tablet Take 15 mg by mouth at bedtime.       . Omega-3 Fatty Acids (FISH OIL) 1000 MG CAPS Take 4 capsules daily.      . ONE TOUCH ULTRA TEST test strip USE 4 TIMES DAILY TO CHECK BLOOD SUGARS  300 each  3  . primidone (MYSOLINE) 250 MG tablet 1 tablet by mouth every morning and 3 tablets by mouth at bedtime      . PARoxetine (PAXIL) 30 MG tablet Take 1 tablet (30 mg total) by mouth 2 (two) times daily.  60 tablet  3   No current facility-administered medications on file prior to visit.    BP 124/80  Pulse 72  Temp(Src) 98.5 F (36.9 C) (Oral)  Wt 270 lb  (122.471 kg)  BMI 46.32 kg/m2       Objective:   Physical Exam  Constitutional: She is oriented to person, place, and time. She appears well-developed and well-nourished.  Cardiovascular: Normal rate, regular rhythm and normal heart sounds.   Pulmonary/Chest: Effort normal and breath sounds normal. She has no wheezes.  Neurological: She is alert and oriented to person, place, and time.  Resting tremor  Psychiatric: She has a normal mood and affect. Her behavior is normal.          Assessment & Plan:

## 2012-04-02 NOTE — Assessment & Plan Note (Signed)
Stable. Kidney function is stable.  BP: 124/80 mmHg  Lab Results  Component Value Date   CREATININE 1.6* 03/26/2012

## 2012-04-02 NOTE — Assessment & Plan Note (Signed)
Patient's A1c improved to 6.8. However she is having episodic hypoglycemia. Patient advised to decrease her mealtime insulin dose if she has low carb meal for lunch.  Lab Results  Component Value Date   HGBA1C 6.8* 03/26/2012

## 2012-04-02 NOTE — Patient Instructions (Addendum)
Please complete the following lab tests before your next follow up appointment:  BMET, A1c - 250.02 

## 2012-04-17 ENCOUNTER — Ambulatory Visit (INDEPENDENT_AMBULATORY_CARE_PROVIDER_SITE_OTHER): Payer: Medicare Other

## 2012-04-17 DIAGNOSIS — E538 Deficiency of other specified B group vitamins: Secondary | ICD-10-CM

## 2012-04-17 MED ORDER — CYANOCOBALAMIN 1000 MCG/ML IJ SOLN
1000.0000 ug | Freq: Once | INTRAMUSCULAR | Status: AC
  Administered 2012-04-17: 1000 ug via INTRAMUSCULAR

## 2012-04-27 ENCOUNTER — Other Ambulatory Visit: Payer: Self-pay | Admitting: Internal Medicine

## 2012-04-29 ENCOUNTER — Ambulatory Visit: Payer: Medicare Other | Admitting: Internal Medicine

## 2012-05-15 ENCOUNTER — Other Ambulatory Visit: Payer: Self-pay | Admitting: Internal Medicine

## 2012-06-17 ENCOUNTER — Other Ambulatory Visit: Payer: Self-pay | Admitting: Nurse Practitioner

## 2012-06-26 ENCOUNTER — Other Ambulatory Visit: Payer: Medicare Other

## 2012-07-03 ENCOUNTER — Ambulatory Visit: Payer: Medicare Other | Admitting: Internal Medicine

## 2012-07-13 ENCOUNTER — Other Ambulatory Visit: Payer: Self-pay | Admitting: Internal Medicine

## 2012-08-14 ENCOUNTER — Other Ambulatory Visit: Payer: Self-pay | Admitting: Internal Medicine

## 2012-09-15 ENCOUNTER — Other Ambulatory Visit: Payer: Self-pay | Admitting: Neurology

## 2012-09-28 ENCOUNTER — Other Ambulatory Visit: Payer: Self-pay | Admitting: Internal Medicine

## 2012-11-10 ENCOUNTER — Other Ambulatory Visit: Payer: Self-pay | Admitting: Internal Medicine

## 2012-11-22 ENCOUNTER — Other Ambulatory Visit: Payer: Self-pay | Admitting: Internal Medicine

## 2012-11-27 ENCOUNTER — Other Ambulatory Visit: Payer: Self-pay | Admitting: Internal Medicine

## 2012-12-04 ENCOUNTER — Telehealth: Payer: Self-pay | Admitting: Neurology

## 2012-12-04 ENCOUNTER — Other Ambulatory Visit: Payer: Self-pay | Admitting: Neurology

## 2012-12-04 MED ORDER — PRIMIDONE 50 MG PO TABS: ORAL_TABLET | ORAL | Status: DC

## 2012-12-04 NOTE — Telephone Encounter (Signed)
I called in a prescription for Primidone to the pharmacy.

## 2012-12-28 ENCOUNTER — Other Ambulatory Visit: Payer: Self-pay | Admitting: Internal Medicine

## 2012-12-28 ENCOUNTER — Other Ambulatory Visit: Payer: Self-pay | Admitting: Neurology

## 2012-12-29 ENCOUNTER — Encounter: Payer: Self-pay | Admitting: Neurology

## 2012-12-29 ENCOUNTER — Encounter (INDEPENDENT_AMBULATORY_CARE_PROVIDER_SITE_OTHER): Payer: Self-pay

## 2012-12-29 ENCOUNTER — Ambulatory Visit (INDEPENDENT_AMBULATORY_CARE_PROVIDER_SITE_OTHER): Payer: Medicare Other | Admitting: Neurology

## 2012-12-29 ENCOUNTER — Telehealth: Payer: Self-pay | Admitting: Neurology

## 2012-12-29 VITALS — BP 170/90 | HR 64 | Wt 290.0 lb

## 2012-12-29 DIAGNOSIS — Z5181 Encounter for therapeutic drug level monitoring: Secondary | ICD-10-CM

## 2012-12-29 DIAGNOSIS — G250 Essential tremor: Secondary | ICD-10-CM

## 2012-12-29 HISTORY — DX: Essential tremor: G25.0

## 2012-12-29 MED ORDER — QUETIAPINE FUMARATE 400 MG PO TABS: ORAL_TABLET | ORAL | Status: AC

## 2012-12-29 MED ORDER — PRIMIDONE 50 MG PO TABS: ORAL_TABLET | ORAL | Status: DC

## 2012-12-29 NOTE — Progress Notes (Signed)
Reason for visit: Essential tremor  [name] is an 62 y.o. female  History of present illness:  [name] is a 62 year old white female with a history of a benign essential tremor, obesity, diabetes, and dyslipidemia. The patient has been under a lot of stress recently as her husband has bladder cancer, and her son died with heart disease at age 74. The patient is having increasing problems with handwriting and feeding herself. The patient has been on Mysoline taking 50 mg in the morning, 150 mg in the evening. The patient also takes alprazolam 1 mg 4 times daily. The patient denies any excessive daytime drowsiness, but she still has significant tremor. The patient indicates that her father, the paternal grandmother, and several great aunts and uncles had tremor. The patient returns to this office for further evaluation.  Past Medical History  Diagnosis Date  . Pernicious anemia   . Diabetes mellitus, type 2   . Hyperlipemia   . Anxiety   . Depression   . GERD (gastroesophageal reflux disease)   . Allergic rhinitis   . IBS (irritable bowel syndrome)   . Diverticulosis of colon   . Iron deficiency anemia   . Benign essential tremor   . CRI (chronic renal insufficiency)   . Essential and other specified forms of tremor 12/29/2012  . Morbidly obese     Past Surgical History  Procedure Laterality Date  . Abdominal hysterectomy    . Cesarean section    . Tonsillectomy    . Bilateral oophorectomy      ovarian cysts    Family History  Problem Relation Age of Onset  . Heart disease    . Diabetes    . Colitis      ulcerative  . Heart disease Mother   . Diabetes Father   . Hypertension Father   . Heart disease Sister   . Stroke Sister   . Diabetes Sister     Social history:  reports that she has never smoked. She has never used smokeless tobacco. She reports that she does not drink alcohol. Her drug history is not on file.    Allergies  Allergen Reactions  .  Codeine   . Demerol   . Meperidine Hcl   . Niacin-Lovastatin Er     Medications:  Current Outpatient Prescriptions on File Prior to Visit  Medication Sig Dispense Refill  . ALPRAZolam (XANAX) 1 MG tablet Take 1 mg by mouth 4 (four) times daily.       Marland Kitchen amLODipine (NORVASC) 2.5 MG tablet take 1 tablet by mouth once daily  90 tablet  1  . atorvastatin (LIPITOR) 40 MG tablet take 1 tablet by mouth once daily  30 tablet  5  . BD ULTRA-FINE PEN NEEDLES 29G X 12.7MM MISC USE FIVE TIMES DAILY FOR INSULIN INJECTIONS  400 each  2  . Blood Glucose Monitoring Suppl (ONE TOUCH ULTRA SYSTEM KIT) W/DEVICE KIT Use as instructed to check blood sugars four times a day ( Dx Code 250.020)  1 each  0  . Calcium Carbonate-Vitamin D (CALCIUM 600+D) 600-400 MG-UNIT per tablet Take 1 tablet by mouth daily.        Marland Kitchen DIOVAN 160 MG tablet take 1 tablet by mouth once daily  30 tablet  5  . estradiol (VIVELLE-DOT) 0.1 MG/24HR Place 1 patch onto the skin 2 (two) times a week.      . ferrous sulfate 325 (65 FE) MG tablet Take 325 mg  by mouth daily.        . furosemide (LASIX) 20 MG tablet take 1 tablet by mouth twice a day  30 tablet  5  . HUMALOG KWIKPEN 100 UNIT/ML SOPN INJECT 30 UNITS BEFORE BREAKFAST, 30 UNITS BEFORE LUNCH, AND 50 UNITS BEFORE DINNER  3 mL  3  . labetalol (NORMODYNE) 300 MG tablet take 1 tablet by mouth twice a day  180 tablet  1  . LANTUS SOLOSTAR 100 UNIT/ML SOPN INJECT 80 UNITS UNDER THE SKIN AT BEDTIME  5 pen  4  . mirtazapine (REMERON) 15 MG tablet Take 15 mg by mouth at bedtime.       . Omega-3 Fatty Acids (FISH OIL) 1000 MG CAPS Take 1,200 mg by mouth. Take 4 capsules daily.      . ONE TOUCH ULTRA TEST test strip USE 4 TIMES DAILY TO CHECK BLOOD SUGARS  300 each  3  . asenapine (SAPHRIS) 5 MG SUBL Place 1 tablet (5 mg total) under the tongue at bedtime.  30 tablet  0  . benzonatate (TESSALON) 200 MG capsule Take 1 capsule (200 mg total) by mouth 2 (two) times daily as needed for cough.  20  capsule  0  . busPIRone (BUSPAR) 15 MG tablet Take 30 mg by mouth 3 (three) times daily.       . cyanocobalamin 1000 MCG/ML injection Inject into the muscle every 30 (thirty) days.        Marland Kitchen PARoxetine (PAXIL) 30 MG tablet Take 1 tablet (30 mg total) by mouth 2 (two) times daily.  60 tablet  3   No current facility-administered medications on file prior to visit.    ROS:  Out of a complete 14 system review of symptoms, the patient complains only of the following symptoms, and all other reviewed systems are negative.  Tremors Anemia Depression, anxiety  Blood pressure 170/90, pulse 64, weight 290 lb (131.543 kg).  Physical Exam  General: The patient is alert and cooperative at the time of the examination. The patient is markedly obese.  Skin: No significant peripheral edema is noted.   Neurologic Exam  Mental status: The patient is oriented x 3.  Cranial nerves: Facial symmetry is present. Speech is normal, no aphasia or dysarthria is noted. Extraocular movements are full. Visual fields are full.  Motor: The patient has good strength in all 4 extremities.  Sensory examination: Soft touch sensation on the face, arms, and legs is symmetric.   Coordination: The patient has good finger-nose-finger and heel-to-shin bilaterally. The patient has an intention tremor with finger-nose-finger bilaterally.  Gait and station: The patient has a normal gait. Tandem gait is normal. Romberg is negative. No drift is seen.  Reflexes: Deep tendon reflexes are symmetric.   Assessment/Plan:  One. Benign essential tremor  The tremor has worsened gradually over time. The patient is also under increased stress. The patient will be increased on Mysoline taking 100 mg in the morning, 150 mg in the evening. The patient will followup through this office in about 6 months.  [name] 12/29/2012 7:10 PM  [facility] [address] [place], [place]  [address]  Phone [phone] Fax [phone]

## 2012-12-29 NOTE — Patient Instructions (Signed)
Tremor  Tremor is a rhythmic, involuntary muscular contraction characterized by oscillations (to-and-fro movements) of a part of the body. The most common of all involuntary movements, tremor can affect various body parts such as the hands, head, facial structures, vocal cords, trunk, and legs; most tremors, however, occur in the hands. Tremor often accompanies neurological disorders associated with aging. Although the disorder is not life-threatening, it can be responsible for functional disability and social embarrassment.  TREATMENT   There are many types of tremor and several ways in which tremor is classified. The most common classification is by behavioral context or position. There are five categories of tremor within this classification: resting, postural, kinetic, task-specific, and psychogenic. Resting or static tremor occurs when the muscle is at rest, for example when the hands are lying on the lap. This type of tremor is often seen in patients with Parkinson's disease. Postural tremor occurs when a patient attempts to maintain posture, such as holding the hands outstretched. Postural tremors include physiological tremor, essential tremor, tremor with basal ganglia disease (also seen in patients with Parkinson's disease), cerebellar postural tremor, tremor with peripheral neuropathy, post-traumatic tremor, and alcoholic tremor. Kinetic or intention (action) tremor occurs during purposeful movement, for example during finger-to-nose testing. Task-specific tremor appears when performing goal-oriented tasks such as handwriting, speaking, or standing. This group consists of primary writing tremor, vocal tremor, and orthostatic tremor. Psychogenic tremor occurs in both older and younger patients. The key feature of this tremor is that it dramatically lessens or disappears when the patient is distracted.  PROGNOSIS  There are some treatment options available for tremor; the appropriate treatment depends on  accurate diagnosis of the cause. Some tremors respond to treatment of the underlying condition, for example in some cases of psychogenic tremor treating the patient's underlying mental problem may cause the tremor to disappear. Also, patients with tremor due to Parkinson's disease may be treated with Levodopa drug therapy. Symptomatic drug therapy is available for several other tremors as well. For those cases of tremor in which there is no effective drug treatment, physical measures such as teaching the patient to brace the affected limb during the tremor are sometimes useful. Surgical intervention such as thalamotomy or deep brain stimulation may be useful in certain cases.  Document Released: 12/28/2001 Document Revised: 04/01/2011 Document Reviewed: 01/07/2005  [employer]® Patient Information ©2014 [employer].

## 2012-12-29 NOTE — Telephone Encounter (Signed)
According to the chart, Dr Jannifer Franklin already called in this Rx with refills. I called the patient.  Got no answer.  Left message.

## 2012-12-29 NOTE — Telephone Encounter (Signed)
Patient stated at check out that she needs to have a refill of Primidone because she only has enough for tonight. Patient wants to be sure that the refill gets called in today. Please call.

## 2012-12-30 ENCOUNTER — Telehealth: Payer: Self-pay | Admitting: Neurology

## 2012-12-30 LAB — CBC WITH DIFFERENTIAL
Basophils Absolute: 0 10*3/uL (ref 0.0–0.2)
Basos: 0 %
Eosinophils Absolute: 0.1 10*3/uL (ref 0.0–0.4)
Hemoglobin: 13.1 g/dL (ref 11.1–15.9)
Immature Grans (Abs): 0 10*3/uL (ref 0.0–0.1)
Immature Granulocytes: 0 %
Lymphs: 30 %
MCH: 30.7 pg (ref 26.6–33.0)
MCHC: 34.2 g/dL (ref 31.5–35.7)
Monocytes Absolute: 0.6 10*3/uL (ref 0.1–0.9)
Neutrophils Relative %: 61 %
Platelets: 265 10*3/uL (ref 150–379)
WBC: 8.1 10*3/uL (ref 3.4–10.8)

## 2012-12-30 LAB — COMPREHENSIVE METABOLIC PANEL
ALT: 30 IU/L (ref 0–32)
AST: 21 IU/L (ref 0–40)
Albumin/Globulin Ratio: 2 (ref 1.1–2.5)
Alkaline Phosphatase: 95 IU/L (ref 39–117)
BUN/Creatinine Ratio: 14 (ref 11–26)
BUN: 22 mg/dL (ref 8–27)
CO2: 22 mmol/L (ref 18–29)
Calcium: 10.1 mg/dL (ref 8.6–10.2)
Creatinine, Ser: 1.57 mg/dL — ABNORMAL HIGH (ref 0.57–1.00)
GFR calc Af Amer: 41 mL/min/{1.73_m2} — ABNORMAL LOW (ref >59)
GFR calc non Af Amer: 36 mL/min/{1.73_m2} — ABNORMAL LOW (ref >59)
Globulin, Total: 2.1 g/dL (ref 1.5–4.5)
Potassium: 4.1 mmol/L (ref 3.5–5.2)
Sodium: 141 mmol/L (ref 134–144)

## 2012-12-30 LAB — PRIMIDONE, SERUM
Phenobarbital Lvl: 3 ug/mL — ABNORMAL LOW (ref 15–40)
Primidone Lvl: 6.1 ug/mL (ref 5.0–12.0)

## 2012-12-30 NOTE — Telephone Encounter (Signed)
I called patient. The blood work was unremarkable, the patient has chronic renal insufficiency that is stable. Primidone levels are therapeutic, phenobarbital levels are low. Okay to increase the Primidone.

## 2013-01-19 ENCOUNTER — Telehealth: Payer: Self-pay | Admitting: *Deleted

## 2013-01-21 ENCOUNTER — Other Ambulatory Visit: Payer: Self-pay | Admitting: Neurology

## 2013-01-24 NOTE — Telephone Encounter (Signed)
Prescribing Provider Encounter Provider   Kathrynn Ducking, MD Kathrynn Ducking, MD         Medication Detail      Disp Refills Start End     primidone (MYSOLINE) 50 MG tablet 150 tablet 5 12/29/2012     Sig: 2 tablets in the morning, 3 tablets at night    Class: Phone In                [place], [place] - [address]

## 2013-01-25 NOTE — Telephone Encounter (Signed)
Duplicate message. 

## 2013-01-25 NOTE — Telephone Encounter (Signed)
I received call from [facility] requesting clarification on patients order (MYSOLINE) 50 MG tablet 150 tablet 5 12/29/2012 2 tablets in the morning, 3 tablets at night. He states that another order had been sent in on December 29 2012. I reviewed the office note for that date and confirmed the order as above.  I asked if they had gotten a refill order fo the Seroquel. Catalina Antigua stated he has not and it wasd last ordered in November 2014. I will     QUEtiapine (SEROQUEL) 400 MG tablet 12/29/2012 One half tablet in the morning and one tablet in the evening primidone (MYSOLINE) 50 MG tablet 150 tablet 5 12/29/2012 2 tablets in the morning, 3 tablets at night

## 2013-01-25 NOTE — Telephone Encounter (Signed)
Continuation of below: The Seroquel appears to have been D/C'd but not reordered on Dec 29 2012. I will notify J Banks, RPhA to address.

## 2013-01-25 NOTE — Telephone Encounter (Signed)
We have never filled Seroquel for this patient.  I called the pharmacy back.  Spoke with Nimrod, he said the patient gets this med through Dr Sabra Heck.

## 2013-02-08 ENCOUNTER — Other Ambulatory Visit: Payer: Self-pay | Admitting: Internal Medicine

## 2013-02-26 ENCOUNTER — Other Ambulatory Visit: Payer: Self-pay | Admitting: Internal Medicine

## 2013-03-24 ENCOUNTER — Other Ambulatory Visit: Payer: Self-pay | Admitting: Internal Medicine

## 2013-04-14 ENCOUNTER — Other Ambulatory Visit: Payer: Self-pay | Admitting: Internal Medicine

## 2013-06-26 ENCOUNTER — Other Ambulatory Visit: Payer: Self-pay | Admitting: Internal Medicine

## 2013-06-29 ENCOUNTER — Ambulatory Visit (INDEPENDENT_AMBULATORY_CARE_PROVIDER_SITE_OTHER): Payer: Medicare Other | Admitting: Nurse Practitioner

## 2013-06-29 ENCOUNTER — Encounter (INDEPENDENT_AMBULATORY_CARE_PROVIDER_SITE_OTHER): Payer: Self-pay

## 2013-06-29 ENCOUNTER — Encounter: Payer: Self-pay | Admitting: Nurse Practitioner

## 2013-06-29 VITALS — BP 147/70 | HR 91 | Ht 64.0 in | Wt 285.0 lb

## 2013-06-29 DIAGNOSIS — G252 Other specified forms of tremor: Principal | ICD-10-CM

## 2013-06-29 DIAGNOSIS — G250 Essential tremor: Secondary | ICD-10-CM

## 2013-06-29 DIAGNOSIS — Z5181 Encounter for therapeutic drug level monitoring: Secondary | ICD-10-CM

## 2013-06-29 NOTE — Progress Notes (Signed)
[facility]  PATIENT: [name] DOB: Apr 30, 1950   REASON FOR VISIT: follow up for tremor    HISTORY OF PRESENT ILLNESS:[name], 63 year old female returns for followup. She was last seen by Dr. Jannifer Franklin 12/29/2012. She has benign essential tremor, obesity, diabetes, and dyslipidemia. The patient has been under a lot of stress recently as her husband has bladder cancer, and her son died with heart disease at age 17. The patient is having increasing problems with handwriting and feeding herself. The patient has been on Mysoline taking 50 mg in the morning, 150 mg in the evening. The patient also takes alprazolam 1 mg 4 times daily. The patient denies any excessive daytime drowsiness, but she still has significant tremor. The patient indicates that her father, the paternal grandmother, and several great aunts and uncles had tremor. The patient returns to this office for further evaluation.  REVIEW OF SYSTEMS: Full 14 system review of systems performed and notable only for those listed, all others are neg:  Constitutional: N/A  Cardiovascular: N/A  Ear/Nose/Throat: N/A  Skin: N/A  Eyes: N/A  Respiratory: N/A  Gastroitestinal: N/A  Hematology/Lymphatic: N/A  Endocrine: N/A Musculoskeletal:N/A  Allergy/Immunology: N/A  Neurological: Tremor  Psychiatric: N/A Sleep : NA   ALLERGIES: Allergies  Allergen Reactions  . Codeine   . Demerol   . Meperidine Hcl   . Niacin-Lovastatin Er     HOME MEDICATIONS: Outpatient Prescriptions Prior to Visit  Medication Sig Dispense Refill  . ALPRAZolam (XANAX) 1 MG tablet Take 1 mg by mouth 4 (four) times daily.       Marland Kitchen amLODipine (NORVASC) 2.5 MG tablet take 1 tablet by mouth once daily  90 tablet  1  . atorvastatin (LIPITOR) 40 MG tablet take 1 tablet by mouth once daily  30 tablet  0  . BD ULTRA-FINE PEN NEEDLES 29G X 12.7MM MISC USE FIVE TIMES DAILY FOR INSULIN INJECTIONS  400 each  2  . Blood Glucose Monitoring  Suppl (ONE TOUCH ULTRA SYSTEM KIT) W/DEVICE KIT Use as instructed to check blood sugars four times a day ( Dx Code 250.020)  1 each  0  . Calcium Carbonate-Vitamin D (CALCIUM 600+D) 600-400 MG-UNIT per tablet Take 1 tablet by mouth daily.        Marland Kitchen estradiol (VIVELLE-DOT) 0.1 MG/24HR Place 1 patch onto the skin 2 (two) times a week.      . ferrous sulfate 325 (65 FE) MG tablet Take 325 mg by mouth daily.        . furosemide (LASIX) 20 MG tablet take 1 tablet by mouth twice a day  30 tablet  2  . gabapentin (NEURONTIN) 300 MG capsule Take 300 mg by mouth 3 (three) times daily.       Marland Kitchen HUMALOG KWIKPEN 100 UNIT/ML SOPN INJECT 30 UNITS BEFORE BREAKFAST, 30 UNITS BEFORE LUNCH, AND 50 UNITS BEFORE DINNER  3 mL  3  . labetalol (NORMODYNE) 300 MG tablet take 1 tablet by mouth twice a day  180 tablet  1  . LANTUS SOLOSTAR 100 UNIT/ML SOPN INJECT 80 UNITS UNDER THE SKIN AT BEDTIME  5 pen  4  . mirtazapine (REMERON) 15 MG tablet Take 15 mg by mouth at bedtime.       . Omega-3 Fatty Acids (FISH OIL) 1000 MG CAPS Take 1,200 mg by mouth. Take 4 capsules daily.      . ONE TOUCH ULTRA TEST test strip USE 4 TIMES DAILY TO CHECK BLOOD SUGARS  300 each  3  . primidone (MYSOLINE) 50 MG tablet 2 tablets in the morning, 3 tablets at night  150 tablet  5  . QUEtiapine (SEROQUEL) 400 MG tablet One half tablet in the morning and one tablet in the evening      . PARoxetine (PAXIL) 30 MG tablet Take 1 tablet (30 mg total) by mouth 2 (two) times daily.  60 tablet  3  . asenapine (SAPHRIS) 5 MG SUBL Place 1 tablet (5 mg total) under the tongue at bedtime.  30 tablet  0  . benzonatate (TESSALON) 200 MG capsule Take 1 capsule (200 mg total) by mouth 2 (two) times daily as needed for cough.  20 capsule  0  . busPIRone (BUSPAR) 15 MG tablet Take 30 mg by mouth 3 (three) times daily.       . cyanocobalamin 1000 MCG/ML injection Inject into the muscle every 30 (thirty) days.        . valsartan (DIOVAN) 160 MG tablet take 1 tablet by  mouth once daily  30 tablet  0   No facility-administered medications prior to visit.    PAST MEDICAL HISTORY: Past Medical History  Diagnosis Date  . Pernicious anemia   . Diabetes mellitus, type 2   . Hyperlipemia   . Anxiety   . Depression   . GERD (gastroesophageal reflux disease)   . Allergic rhinitis   . IBS (irritable bowel syndrome)   . Diverticulosis of colon   . Iron deficiency anemia   . Benign essential tremor   . CRI (chronic renal insufficiency)   . Essential and other specified forms of tremor 12/29/2012  . Morbidly obese     PAST SURGICAL HISTORY: Past Surgical History  Procedure Laterality Date  . Abdominal hysterectomy    . Cesarean section    . Tonsillectomy    . Bilateral oophorectomy      ovarian cysts    FAMILY HISTORY: Family History  Problem Relation Age of Onset  . Heart disease    . Diabetes    . Colitis      ulcerative  . Heart disease Mother   . Diabetes Father   . Hypertension Father   . Heart disease Sister   . Stroke Sister   . Diabetes Sister     SOCIAL HISTORY: History   Social History  . Marital Status: Married    Spouse Name: Lakin     Number of Children: 2  . Years of Education: 14   Occupational History  .     Social History Main Topics  . Smoking status: Never Smoker   . Smokeless tobacco: Never Used  . Alcohol Use: No  . Drug Use: Not on file  . Sexual Activity: Not on file   Other Topics Concern  . Not on file   Social History Narrative   Patient lives at home with husband Raeford.   Patient has 2 children. One past away last year.    Patient is currently not working.    Patient has 2 years of college.    Patient is right handed.               PHYSICAL EXAM  Filed Vitals:   06/29/13 1517  BP: 147/70  Pulse: 91  Height: _0  (1.626 m)  Weight: 285 lb (129.275 kg)   Body mass index is 48.9 kg/(m^2).  Generalized: Well developed, morbidly obese female in no acute distress  Head:  normocephalic and atraumatic,. Oropharynx  benign  Neck: Supple, no carotid bruits  Cardiac: Regular rate rhythm, no murmur  Musculoskeletal: No deformity   Neurological examination   Mentation: Alert oriented to time, place, history taking. Follows all commands speech and language fluent  Cranial nerve II-XII: Pupils were equal round reactive to light extraocular movements were full, visual field were full on confrontational test. Facial sensation and strength were normal. hearing was intact to finger rubbing bilaterally. Uvula tongue midline. head turning and shoulder shrug were normal and symmetric.Tongue protrusion into cheek strength was normal. Motor: normal bulk and tone, full strength in the BUE, BLE, fine finger movements normal, no pronator drift. No focal weakness Coordination: finger-nose-finger, heel-to-shin bilaterally, no dysmetria, intention tremor with finger to nose finger bilaterally Reflexes: Brachioradialis 2/2, biceps 2/2, triceps 2/2, patellar 2/2, Achilles 2/2, plantar responses were flexor bilaterally. Gait and Station: Rising up from seated position without assistance, normal stance,  moderate stride, good arm swing, smooth turning, able to perform tiptoe, and heel walking without difficulty. Tandem gait is steady  DIAGNOSTIC DATA (LABS, IMAGING, TESTING) -    ASSESSMENT AND PLAN  63 y.o. year old female  has a past medical history of benign essential tremor which is gotten progressively worse.   Continue Mysoline at current dose will refill once labs are back Will check labs today, CBC, CMP, PB and Primidone level Followup 6-8 months Dennie Bible, [facility], [facility], APRN  [facility] [address] [place], [place] [address] [phone]

## 2013-06-29 NOTE — Progress Notes (Signed)
I have read the note, and I agree with the clinical assessment and plan.  Phong Isenberg K Roanne Haye   

## 2013-06-29 NOTE — Patient Instructions (Signed)
Continue Mysoline at current dose Will check labs today Followup 6-8 months

## 2013-06-30 ENCOUNTER — Telehealth: Payer: Self-pay | Admitting: Nurse Practitioner

## 2013-06-30 LAB — CBC WITH DIFFERENTIAL/PLATELET
BASOS ABS: 0 10*3/uL (ref 0.0–0.2)
Basos: 0 %
EOS ABS: 0.1 10*3/uL (ref 0.0–0.4)
Eos: 0 %
HCT: 35.5 % (ref 34.0–46.6)
HEMOGLOBIN: 11.6 g/dL (ref 11.1–15.9)
Immature Grans (Abs): 0 10*3/uL (ref 0.0–0.1)
Immature Granulocytes: 0 %
LYMPHS: 17 %
Lymphocytes Absolute: 2.2 10*3/uL (ref 0.7–3.1)
MCH: 30.1 pg (ref 26.6–33.0)
MCHC: 32.7 g/dL (ref 31.5–35.7)
MCV: 92 fL (ref 79–97)
Monocytes Absolute: 1 10*3/uL — ABNORMAL HIGH (ref 0.1–0.9)
Monocytes: 8 %
NEUTROS ABS: 10.1 10*3/uL — AB (ref 1.4–7.0)
NEUTROS PCT: 75 %
RBC: 3.86 x10E6/uL (ref 3.77–5.28)
RDW: 15.9 % — ABNORMAL HIGH (ref 12.3–15.4)
WBC: 13.5 10*3/uL — ABNORMAL HIGH (ref 3.4–10.8)

## 2013-06-30 LAB — COMPREHENSIVE METABOLIC PANEL
A/G RATIO: 1.9 (ref 1.1–2.5)
ALBUMIN: 4.2 g/dL (ref 3.6–4.8)
ALT: 28 IU/L (ref 0–32)
AST: 17 IU/L (ref 0–40)
Alkaline Phosphatase: 97 IU/L (ref 39–117)
BUN/Creatinine Ratio: 14 (ref 11–26)
BUN: 26 mg/dL (ref 8–27)
CO2: 24 mmol/L (ref 18–29)
Calcium: 10.1 mg/dL (ref 8.7–10.3)
Chloride: 97 mmol/L (ref 97–108)
Creatinine, Ser: 1.82 mg/dL — ABNORMAL HIGH (ref 0.57–1.00)
GFR calc Af Amer: 34 mL/min/{1.73_m2} — ABNORMAL LOW (ref >59)
GFR, EST NON AFRICAN AMERICAN: 30 mL/min/{1.73_m2} — AB (ref >59)
GLOBULIN, TOTAL: 2.2 g/dL (ref 1.5–4.5)
GLUCOSE: 129 mg/dL — AB (ref 65–99)
Potassium: 4.5 mmol/L (ref 3.5–5.2)
Sodium: 140 mmol/L (ref 134–144)
TOTAL PROTEIN: 6.4 g/dL (ref 6.0–8.5)
Total Bilirubin: 0.3 mg/dL (ref 0.0–1.2)

## 2013-06-30 LAB — PRIMIDONE, SERUM
Phenobarbital Lvl: 4 ug/mL — ABNORMAL LOW (ref 15–40)
Primidone Lvl: 6.8 ug/mL (ref 5.0–12.0)

## 2013-06-30 MED ORDER — PRIMIDONE 50 MG PO TABS: ORAL_TABLET | ORAL | Status: DC

## 2013-06-30 NOTE — Telephone Encounter (Signed)
Labs called to patient left message, will increase Primidone to 2 tabs am and continue same dose at night. Faxed new order. WBC was elevated and may want to follow up with PCP in 6 weeks or so. This indicates infection. Call for questions

## 2013-12-08 ENCOUNTER — Encounter: Payer: Self-pay | Admitting: Neurology

## 2013-12-14 ENCOUNTER — Encounter: Payer: Self-pay | Admitting: Neurology

## 2013-12-29 ENCOUNTER — Encounter: Payer: Self-pay | Admitting: Nurse Practitioner

## 2013-12-29 ENCOUNTER — Ambulatory Visit (INDEPENDENT_AMBULATORY_CARE_PROVIDER_SITE_OTHER): Payer: Medicare Other | Admitting: Nurse Practitioner

## 2013-12-29 VITALS — BP 142/90 | HR 89 | Temp 98.6°F | Ht 64.0 in | Wt 284.0 lb

## 2013-12-29 DIAGNOSIS — Z5181 Encounter for therapeutic drug level monitoring: Secondary | ICD-10-CM

## 2013-12-29 DIAGNOSIS — R251 Tremor, unspecified: Secondary | ICD-10-CM

## 2013-12-29 DIAGNOSIS — G250 Essential tremor: Secondary | ICD-10-CM

## 2013-12-29 DIAGNOSIS — G252 Other specified forms of tremor: Principal | ICD-10-CM

## 2013-12-29 NOTE — Progress Notes (Signed)
[facility]  PATIENT: [name] DOB: 10-Feb-1950   REASON FOR VISIT: Follow-up  for essential tremor  HISTORY OF PRESENT ILLNESS:[name], 63 year old female returns for followup. She was last seen 06/29/2013.  She has benign essential tremor, obesity, diabetes, and dyslipidemia. The patient has been under a lot of stress recently as her husband has bladder cancer, and her son died with heart disease at age 27 about 6 months ago.  The patient reports her tremor is about the same.  The patient has been on Mysoline taking 100 mg in the morning, 150 mg in the evening. The patient also takes alprazolam 1 mg 4 times daily. The patient denies any excessive daytime drowsiness, but she still has mild  tremor.  The patient also reports that her tremor got a little worse when her dose of insulin was changed about a month ago. Her hemoglobin A1c is 8.2.The patient indicates that her father, the paternal grandmother, and several great aunts and uncles had tremor. The patient returns to this office for further evaluation. REVIEW OF SYSTEMS: Full 14 system review of systems performed and notable only for those listed, all others are neg:  Constitutional: N/A  Cardiovascular: N/A  Ear/Nose/Throat: N/A  Skin: N/A  Eyes: N/A  Respiratory: N/A  Gastroitestinal:  Urinary frequency Hematology/Lymphatic: Anemia Endocrine: N/A Musculoskeletal:N/A  Allergy/Immunology: N/A  Neurological:  Tremor Psychiatric:  Depression and anxiety Sleep :  Snoring  ALLERGIES: Allergies  Allergen Reactions  . Codeine   . Demerol   . Hydrochlorothiazide     Nauseated, tremors get worse  . Meperidine Hcl   . Niacin-Lovastatin Er     HOME MEDICATIONS: Outpatient Prescriptions Prior to Visit  Medication Sig Dispense Refill  . ALPRAZolam (XANAX) 1 MG tablet Take 1 mg by mouth 4 (four) times daily.     Marland Kitchen amLODipine (NORVASC) 2.5 MG tablet take 1 tablet by mouth once daily 90 tablet 1  .  atorvastatin (LIPITOR) 40 MG tablet take 1 tablet by mouth once daily 30 tablet 0  . BD ULTRA-FINE PEN NEEDLES 29G X 12.7MM MISC USE FIVE TIMES DAILY FOR INSULIN INJECTIONS 400 each 2  . Blood Glucose Monitoring Suppl (ONE TOUCH ULTRA SYSTEM KIT) W/DEVICE KIT Use as instructed to check blood sugars four times a day ( Dx Code 250.020) 1 each 0  . Calcium Carbonate-Vitamin D (CALCIUM 600+D) 600-400 MG-UNIT per tablet Take 1 tablet by mouth daily.      Marland Kitchen estradiol (VIVELLE-DOT) 0.1 MG/24HR Place 1 patch onto the skin 2 (two) times a week.    . ferrous sulfate 325 (65 FE) MG tablet Take 325 mg by mouth daily.      . furosemide (LASIX) 20 MG tablet take 1 tablet by mouth twice a day 30 tablet 2  . gabapentin (NEURONTIN) 300 MG capsule Take 300 mg by mouth 3 (three) times daily.     Marland Kitchen HUMALOG KWIKPEN 100 UNIT/ML SOPN INJECT 30 UNITS BEFORE BREAKFAST, 30 UNITS BEFORE LUNCH, AND 50 UNITS BEFORE DINNER 3 mL 3  . labetalol (NORMODYNE) 300 MG tablet take 1 tablet by mouth twice a day 180 tablet 1  . mirtazapine (REMERON) 15 MG tablet Take 15 mg by mouth at bedtime.     . Omega-3 Fatty Acids (FISH OIL) 1000 MG CAPS Take 1,200 mg by mouth. Take 4 capsules daily.    . ONE TOUCH ULTRA TEST test strip USE 4 TIMES DAILY TO CHECK BLOOD SUGARS 300 each 3  . primidone (MYSOLINE)  50 MG tablet 2 tablets in the morning, 3 tablets at night 150 tablet 6  . QUEtiapine (SEROQUEL) 400 MG tablet One half tablet in the morning and one tablet in the evening    . PARoxetine (PAXIL) 30 MG tablet Take 1 tablet (30 mg total) by mouth 2 (two) times daily. 60 tablet 3  . LANTUS SOLOSTAR 100 UNIT/ML SOPN INJECT 80 UNITS UNDER THE SKIN AT BEDTIME 5 pen 4   No facility-administered medications prior to visit.    PAST MEDICAL HISTORY: Past Medical History  Diagnosis Date  . Pernicious anemia   . Diabetes mellitus, type 2   . Hyperlipemia   . Anxiety   . Depression   . GERD (gastroesophageal reflux disease)   . Allergic  rhinitis   . IBS (irritable bowel syndrome)   . Diverticulosis of colon   . Iron deficiency anemia   . Benign essential tremor   . CRI (chronic renal insufficiency)   . Essential and other specified forms of tremor 12/29/2012  . Morbidly obese     PAST SURGICAL HISTORY: Past Surgical History  Procedure Laterality Date  . Abdominal hysterectomy    . Cesarean section    . Tonsillectomy    . Bilateral oophorectomy      ovarian cysts    FAMILY HISTORY: Family History  Problem Relation Age of Onset  . Heart disease    . Diabetes    . Colitis      ulcerative  . Heart disease Mother   . Diabetes Father   . Hypertension Father   . Heart disease Sister   . Stroke Sister   . Diabetes Sister     SOCIAL HISTORY: History   Social History  . Marital Status: Married    Spouse Name: [place]     Number of Children: 2  . Years of Education: 14   Occupational History  .     Social History Main Topics  . Smoking status: Never Smoker   . Smokeless tobacco: Never Used  . Alcohol Use: No  . Drug Use: Not on file  . Sexual Activity: Not on file   Other Topics Concern  . Not on file   Social History Narrative   Patient lives at home with husband Raeford.   Patient has 2 children. One past away last year.    Patient is currently not working.    Patient has 2 years of college.    Patient is right handed.               PHYSICAL EXAM  Filed Vitals:   12/29/13 1501  BP: 142/90  Pulse: 89  Temp: 98.6 F (37 C)  TempSrc: Oral  Height: _0  (1.626 m)  Weight: 284 lb (128.822 kg)   Body mass index is 48.72 kg/(m^2). Generalized: Well developed, morbidly obese female in no acute distress  Head: normocephalic and atraumatic,. Oropharynx benign  Neck: Supple, no carotid bruits  Cardiac: Regular rate rhythm, no murmur  Musculoskeletal: No deformity   Neurological examination   Mentation: Alert oriented to time, place, history taking. Follows all commands speech  and language fluent  Cranial nerve II-XII: Pupils were equal round reactive to light extraocular movements were full, visual field were full on confrontational test. Facial sensation and strength were normal. hearing was intact to finger rubbing bilaterally. Uvula tongue midline. head turning and shoulder shrug were normal and symmetric.Tongue protrusion into cheek strength was normal. Motor: normal bulk and tone, full strength  in the BUE, BLE, fine finger movements normal, no pronator drift. No focal weakness Coordination: finger-nose-finger, heel-to-shin bilaterally, no dysmetria, intention tremor with finger to nose finger bilaterally Reflexes: Brachioradialis 2/2, biceps 2/2, triceps 2/2, patellar 2/2, Achilles 2/2, plantar responses were flexor bilaterally. Gait and Station: Rising up from seated position without assistance, normal stance, moderate stride, good arm swing, smooth turning, able to perform tiptoe, and heel walking without difficulty. Tandem gait is steady   DIAGNOSTIC DATA (LABS, IMAGING, TESTING) - I reviewed patient records, labs, notes, testing and imaging myself where available.  Lab Results  Component Value Date   WBC 13.5* 06/29/2013   HGB 11.6 06/29/2013   HCT 35.5 06/29/2013   MCV 92 06/29/2013   PLT 265 12/29/2012      Component Value Date/Time   NA 140 06/29/2013 1549   NA 139 03/26/2012 1035   K 4.5 06/29/2013 1549   CL 97 06/29/2013 1549   CO2 24 06/29/2013 1549   GLUCOSE 129* 06/29/2013 1549   GLUCOSE 152* 03/26/2012 1035   GLUCOSE 168* 01/31/2006 0816   BUN 26 06/29/2013 1549   BUN 23 03/26/2012 1035   CREATININE 1.82* 06/29/2013 1549   CREATININE 1.62* 07/05/2011 1646   CALCIUM 10.1 06/29/2013 1549   CALCIUM 9.0 05/22/2007 0215   PROT 6.4 06/29/2013 1549   PROT 6.3 09/04/2010 1624   ALBUMIN 4.1 09/04/2010 1624   AST 17 06/29/2013 1549   ALT 28 06/29/2013 1549   ALKPHOS 97 06/29/2013 1549   BILITOT 0.3 06/29/2013 1549   GFRNONAA 30*  06/29/2013 1549   GFRNONAA 36* 05/18/2010 1104   GFRAA 34* 06/29/2013 1549   GFRAA 43* 05/18/2010 1104   Lab Results  Component Value Date   CHOL 167 09/25/2011   HDL 44.60 09/25/2011   LDLCALC 104* 01/02/2010   LDLDIRECT 83.6 09/25/2011   TRIG 257.0* 09/25/2011   CHOLHDL 4 09/25/2011   Lab Results  Component Value Date   HGBA1C 6.8* 03/26/2012       ASSESSMENT AND PLAN  63 y.o. year old female  has a past medical history of benign essential tremor.  Will check  primidone level Continue primidone at current dose for now Follow-up in 6 months [name], [facility], [facility], [facility] [address] [place], [place] [address] [phone]

## 2013-12-29 NOTE — Patient Instructions (Signed)
Will check  primidone level Continue primidone at current dose for now Follow-up in 6 months

## 2013-12-29 NOTE — Progress Notes (Signed)
I have read the note, and I agree with the clinical assessment and plan.  Britlyn Martine KEITH   

## 2013-12-30 LAB — PRIMIDONE, SERUM
PHENOBARBITAL LVL: 5 ug/mL — AB (ref 15–40)
Primidone Lvl: 6.3 ug/mL (ref 5.0–12.0)

## 2013-12-31 NOTE — Progress Notes (Signed)
I called and left a message to notify the patient and I also mailed her a copy of her labs.

## 2014-01-04 ENCOUNTER — Other Ambulatory Visit: Payer: Self-pay | Admitting: Internal Medicine

## 2014-02-08 ENCOUNTER — Other Ambulatory Visit: Payer: Self-pay

## 2014-02-08 MED ORDER — PRIMIDONE 50 MG PO TABS: ORAL_TABLET | ORAL | Status: DC

## 2014-07-04 ENCOUNTER — Ambulatory Visit: Payer: Medicare Other | Admitting: Nurse Practitioner

## 2014-07-06 ENCOUNTER — Encounter: Payer: Self-pay | Admitting: Nurse Practitioner

## 2014-07-06 ENCOUNTER — Ambulatory Visit (INDEPENDENT_AMBULATORY_CARE_PROVIDER_SITE_OTHER): Payer: Medicare Other | Admitting: Nurse Practitioner

## 2014-07-06 VITALS — BP 149/75 | HR 81 | Ht 64.0 in | Wt 284.6 lb

## 2014-07-06 DIAGNOSIS — G252 Other specified forms of tremor: Principal | ICD-10-CM

## 2014-07-06 DIAGNOSIS — G250 Essential tremor: Secondary | ICD-10-CM

## 2014-07-06 DIAGNOSIS — R251 Tremor, unspecified: Secondary | ICD-10-CM | POA: Diagnosis not present

## 2014-07-06 MED ORDER — PRIMIDONE 50 MG PO TABS: ORAL_TABLET | ORAL | Status: DC

## 2014-07-06 NOTE — Progress Notes (Signed)
I have read the note, and I agree with the clinical assessment and plan.  WILLIS,CHARLES KEITH   

## 2014-07-06 NOTE — Patient Instructions (Signed)
Continue primidone at current dose Rx to patient she is changing pharmacies Follow-up in 6 months

## 2014-07-06 NOTE — Progress Notes (Signed)
[facility]  PATIENT: [name] DOB: 1950-09-08   REASON FOR VISIT: Follow-up for essential tremor HISTORY FROM: Patient    HISTORY OF PRESENT ILLNESS:[name], 64 year old female returns for followup. She was last seen 12/29/2013. She has benign essential tremor, obesity, diabetes, and dyslipidemia. The patient has been under a lot of stress recently as her husband has bladder cancer. Her husband was recently laid off from his job The patient reports her tremor is about the same. The patient has been on Mysoline taking 100 mg in the morning, 150 mg in the evening. The patient also takes alprazolam 1 mg 4 times daily. The patient denies any excessive daytime drowsiness, but she still has mild tremor but this is not bothersome and does not interfere with her ability to do her ADLs.The patient indicates that her father, the paternal grandmother, and several great aunts and uncles had tremor. The patient returns to this office for further evaluation.   REVIEW OF SYSTEMS: Full 14 system review of systems performed and notable only for those listed, all others are neg:  Constitutional: neg  Cardiovascular: neg Ear/Nose/Throat: neg  Skin: neg Eyes: neg Respiratory: neg Gastroitestinal: neg  Hematology/Lymphatic: neg  Endocrine: neg Musculoskeletal:neg Allergy/Immunology: neg Neurological: Essential tremor Psychiatric: Depression and anxiety Sleep : neg   ALLERGIES: Allergies  Allergen Reactions  . Codeine   . Demerol   . Hydrochlorothiazide     Nauseated, tremors get worse  . Meperidine Hcl   . Niacin-Lovastatin Er     HOME MEDICATIONS: Outpatient Prescriptions Prior to Visit  Medication Sig Dispense Refill  . ALPRAZolam (XANAX) 1 MG tablet Take 1 mg by mouth 4 (four) times daily.     Marland Kitchen amLODipine (NORVASC) 2.5 MG tablet take 1 tablet by mouth once daily 90 tablet 1  . atorvastatin (LIPITOR) 40 MG tablet take 1 tablet by mouth once daily 30  tablet 0  . BD ULTRA-FINE PEN NEEDLES 29G X 12.7MM MISC USE FIVE TIMES DAILY FOR INSULIN INJECTIONS 400 each 2  . Blood Glucose Monitoring Suppl (ONE TOUCH ULTRA SYSTEM KIT) W/DEVICE KIT Use as instructed to check blood sugars four times a day ( Dx Code 250.020) 1 each 0  . Calcium Carbonate-Vitamin D (CALCIUM 600+D) 600-400 MG-UNIT per tablet Take 1 tablet by mouth daily.      Marland Kitchen estradiol (VIVELLE-DOT) 0.1 MG/24HR Place 1 patch onto the skin 2 (two) times a week.    . ferrous sulfate 325 (65 FE) MG tablet Take 325 mg by mouth daily.      . furosemide (LASIX) 20 MG tablet take 1 tablet by mouth twice a day 30 tablet 2  . gabapentin (NEURONTIN) 300 MG capsule Take 300 mg by mouth 3 (three) times daily.     Marland Kitchen HUMALOG KWIKPEN 100 UNIT/ML SOPN INJECT 30 UNITS BEFORE BREAKFAST, 30 UNITS BEFORE LUNCH, AND 50 UNITS BEFORE DINNER (Patient taking differently: INJECT 60 UNITS BEFORE BREAKFAST, AND 70 UNITS BEFORE DINNER) 3 mL 3  . labetalol (NORMODYNE) 300 MG tablet take 1 tablet by mouth twice a day 180 tablet 1  . LEVEMIR FLEXTOUCH 100 UNIT/ML Pen   0  . mirtazapine (REMERON) 15 MG tablet Take 15 mg by mouth at bedtime.     . Omega-3 Fatty Acids (FISH OIL) 1000 MG CAPS Take 1,200 mg by mouth. Take 4 capsules daily.    . ONE TOUCH ULTRA TEST test strip USE 4 TIMES DAILY TO CHECK BLOOD SUGARS 300 each 3  . primidone (  MYSOLINE) 50 MG tablet 2 tablets in the morning, 3 tablets at night 150 tablet 6  . QUEtiapine (SEROQUEL) 400 MG tablet One half tablet in the morning and one tablet in the evening    . valsartan (DIOVAN) 320 MG tablet Take 320 mg by mouth daily.   0  . PARoxetine (PAXIL) 30 MG tablet Take 1 tablet (30 mg total) by mouth 2 (two) times daily. 60 tablet 3  . BYDUREON 2 MG PEN   0  . PARoxetine (PAXIL) 30 MG tablet   0   No facility-administered medications prior to visit.    PAST MEDICAL HISTORY: Past Medical History  Diagnosis Date  . Pernicious anemia   . Diabetes mellitus, type 2   .  Hyperlipemia   . Anxiety   . Depression   . GERD (gastroesophageal reflux disease)   . Allergic rhinitis   . IBS (irritable bowel syndrome)   . Diverticulosis of colon   . Iron deficiency anemia   . Benign essential tremor   . CRI (chronic renal insufficiency)   . Essential and other specified forms of tremor 12/29/2012  . Morbidly obese     PAST SURGICAL HISTORY: Past Surgical History  Procedure Laterality Date  . Abdominal hysterectomy    . Cesarean section    . Tonsillectomy    . Bilateral oophorectomy      ovarian cysts    FAMILY HISTORY: Family History  Problem Relation Age of Onset  . Heart disease    . Diabetes    . Colitis      ulcerative  . Heart disease Mother   . Diabetes Father   . Hypertension Father   . Heart disease Sister   . Stroke Sister   . Diabetes Sister     SOCIAL HISTORY: History   Social History  . Marital Status: Married    Spouse Name: Raef[place]   . Number of Children: 2  . Years of Education: 14   Occupational History  .     Social History Main Topics  . Smoking status: Never Smoker   . Smokeless tobacco: Never Used  . Alcohol Use: No  . Drug Use: Not on file  . Sexual Activity: Not on file   Other Topics Concern  . Not on file   Social History Narrative   Patient lives at home with husband Raeford.   Patient has 2 children. One past away last year.    Patient is currently not working.    Patient has 2 years of college.    Patient is right handed.               PHYSICAL EXAM  Filed Vitals:   07/06/14 1528  BP: 149/75  Pulse: 81  Height: $Remove'5\' 4"'[id number]$  (1.626 m)  Weight: 284 lb 9.6 oz (129.094 kg)   Body mass index is 48.83 kg/(m^2). Generalized: Well developed, morbidly obese female in no acute distress  Head: normocephalic and atraumatic,. Oropharynx benign  Neck: Supple, no carotid bruits  Cardiac: Regular rate rhythm, no murmur  Musculoskeletal: No deformity   Neurological examination   Mentation: Alert  oriented to time, place, history taking. Follows all commands speech and language fluent  Cranial nerve II-XII: Pupils were equal round reactive to light extraocular movements were full, visual field were full on confrontational test. Facial sensation and strength were normal. hearing was intact to finger rubbing bilaterally. Uvula tongue midline. head turning and shoulder shrug were normal and symmetric.Tongue protrusion into cheek  strength was normal. Motor: normal bulk and tone, full strength in the BUE, BLE, fine finger movements normal, no pronator drift. No focal weakness Coordination: finger-nose-finger, heel-to-shin bilaterally, no dysmetria, intention tremor with finger to nose finger bilaterally Reflexes: Brachioradialis 2/2, biceps 2/2, triceps 2/2, patellar 2/2, Achilles 2/2, plantar responses were flexor bilaterally. Gait and Station: Rising up from seated position without assistance, normal stance, moderate stride, good arm swing, smooth turning, able to perform tiptoe, and heel walking without difficulty. Tandem gait is steady  DIAGNOSTIC DATA (LABS, IMAGING, TESTING) -  ASSESSMENT AND PLAN  64 y.o. year old female  has a past medical history of essential tremor which is doing well on primidone.  Continue primidone at current dose Rx to patient per request.  Follow-up in 6 months Dennie Bible, [facility], [facility], APRN  [facility] [address], [place], [place] [address] [phone]

## 2014-09-04 ENCOUNTER — Other Ambulatory Visit: Payer: Self-pay | Admitting: Neurology

## 2015-01-05 ENCOUNTER — Encounter: Payer: Self-pay | Admitting: Nurse Practitioner

## 2015-01-05 ENCOUNTER — Ambulatory Visit (INDEPENDENT_AMBULATORY_CARE_PROVIDER_SITE_OTHER): Payer: PPO | Admitting: Nurse Practitioner

## 2015-01-05 VITALS — BP 144/71 | HR 73 | Ht 64.0 in | Wt 294.4 lb

## 2015-01-05 DIAGNOSIS — G250 Essential tremor: Secondary | ICD-10-CM

## 2015-01-05 MED ORDER — PRIMIDONE 50 MG PO TABS: ORAL_TABLET | ORAL | Status: DC

## 2015-01-05 NOTE — Progress Notes (Signed)
I have read the note, and I agree with the clinical assessment and plan.  Lokelani Lutes KEITH   

## 2015-01-05 NOTE — Patient Instructions (Addendum)
Continue primidone at current dose will refill Walk for exercise, this will be good for your anxiety as well as her diabetes and hypertension Follow-up in 6 months

## 2015-01-05 NOTE — Progress Notes (Signed)
[facility]  PATIENT: [name] DOB: 11-Feb-1950   REASON FOR VISIT: follow-up for essential tremor HISTORY FROM:patient    HISTORY OF PRESENT ILLNESS:[name], 64 year old female returns for followup.The patient reports her tremor is about the same. The patient has been on Mysoline taking 100 mg in the morning, 150 mg in the evening. The patient also takes alprazolam 1 mg 4 times daily. The patient denies any excessive daytime drowsiness, but she still has mild tremor but this is not bothersome and does not interfere with her ability to do her ADLs.The patient indicates that her father, the paternal grandmother, and several great aunts and uncles had tremor. The patient returns to this office for further evaluation.her most recent hemoglobin A1c is 6.7 which is an improvement   REVIEW OF SYSTEMS: Full 14 system review of systems performed and notable only for those listed, all others are neg:  Constitutional: neg  Cardiovascular: neg Ear/Nose/Throat: neg  Skin: neg Eyes: neg Respiratory: neg Gastroitestinal: neg  Hematology/Lymphatic: neg  Endocrine: neg Musculoskeletal:neg Allergy/Immunology: neg Neurological: essential tremor Psychiatric: depression and anxiety Sleep : neg   ALLERGIES: Allergies  Allergen Reactions  . Codeine   . Demerol   . Hydrochlorothiazide     Nauseated, tremors get worse  . Meperidine Hcl   . Niacin-Lovastatin Er   . Topiramate     HOME MEDICATIONS: Outpatient Prescriptions Prior to Visit  Medication Sig Dispense Refill  . ALPRAZolam (XANAX) 1 MG tablet Take 1 mg by mouth 4 (four) times daily.     Marland Kitchen amLODipine (NORVASC) 2.5 MG tablet take 1 tablet by mouth once daily 90 tablet 1  . atorvastatin (LIPITOR) 40 MG tablet take 1 tablet by mouth once daily 30 tablet 0  . BD ULTRA-FINE PEN NEEDLES 29G X 12.7MM MISC USE FIVE TIMES DAILY FOR INSULIN INJECTIONS 400 each 2  . Blood Glucose Monitoring Suppl (ONE TOUCH ULTRA  SYSTEM KIT) W/DEVICE KIT Use as instructed to check blood sugars four times a day ( Dx Code 250.020) 1 each 0  . Calcium Carbonate-Vitamin D (CALCIUM 600+D) 600-400 MG-UNIT per tablet Take 1 tablet by mouth daily.      Marland Kitchen estradiol (VIVELLE-DOT) 0.1 MG/24HR Place 1 patch onto the skin 2 (two) times a week.    . ferrous sulfate 325 (65 FE) MG tablet Take 325 mg by mouth daily.      . furosemide (LASIX) 20 MG tablet take 1 tablet by mouth twice a day 30 tablet 2  . gabapentin (NEURONTIN) 300 MG capsule Take 300 mg by mouth 3 (three) times daily.     Marland Kitchen HUMALOG KWIKPEN 100 UNIT/ML SOPN INJECT 30 UNITS BEFORE BREAKFAST, 30 UNITS BEFORE LUNCH, AND 50 UNITS BEFORE DINNER (Patient taking differently: INJECT 60 UNITS BEFORE BREAKFAST, AND 70 UNITS BEFORE DINNER) 3 mL 3  . labetalol (NORMODYNE) 200 MG tablet Take 400 mg by mouth 2 (two) times daily.   0  . LEVEMIR FLEXTOUCH 100 UNIT/ML Pen   0  . mirtazapine (REMERON) 15 MG tablet Take 15 mg by mouth at bedtime.     . Omega-3 Fatty Acids (FISH OIL) 1000 MG CAPS Take 1,200 mg by mouth. Take 4 capsules daily.    . ONE TOUCH ULTRA TEST test strip USE 4 TIMES DAILY TO CHECK BLOOD SUGARS 300 each 3  . primidone (MYSOLINE) 50 MG tablet TAKE 2 TABLETS BY MOUTH IN THE MORNING AND 3 TABLETS AT NIGHT 150 tablet 6  . QUEtiapine (SEROQUEL) 400 MG  tablet One half tablet in the morning and one tablet in the evening    . TRULICITY 8.25 OI/3.7CW SOPN Every 7 days (sunday)    . valsartan (DIOVAN) 320 MG tablet Take 320 mg by mouth daily.   0  . PARoxetine (PAXIL) 30 MG tablet Take 1 tablet (30 mg total) by mouth 2 (two) times daily. 60 tablet 3  . labetalol (NORMODYNE) 300 MG tablet take 1 tablet by mouth twice a day (Patient not taking: Reported on 01/05/2015) 180 tablet 1  . primidone (MYSOLINE) 50 MG tablet 2 tablets in the morning, 3 tablets at night 150 tablet 6   No facility-administered medications prior to visit.    PAST MEDICAL HISTORY: Past Medical History    Diagnosis Date  . Pernicious anemia   . Diabetes mellitus, type 2 ([place])   . Hyperlipemia   . Anxiety   . Depression   . GERD (gastroesophageal reflux disease)   . Allergic rhinitis   . IBS (irritable bowel syndrome)   . Diverticulosis of colon   . Iron deficiency anemia   . Benign essential tremor   . CRI (chronic renal insufficiency)   . Essential and other specified forms of tremor 12/29/2012  . Morbidly obese ([place])     PAST SURGICAL HISTORY: Past Surgical History  Procedure Laterality Date  . Abdominal hysterectomy    . Cesarean section    . Tonsillectomy    . Bilateral oophorectomy      ovarian cysts    FAMILY HISTORY: Family History  Problem Relation Age of Onset  . Heart disease    . Diabetes    . Colitis      ulcerative  . Heart disease Mother   . Diabetes Father   . Hypertension Father   . Heart disease Sister   . Stroke Sister   . Diabetes Sister     SOCIAL HISTORY: Social History   Social History  . Marital Status: Married    Spouse Name: [name]   . Number of Children: 2  . Years of Education: 14   Occupational History  .     Social History Main Topics  . Smoking status: Never Smoker   . Smokeless tobacco: Never Used  . Alcohol Use: No  . Drug Use: Not on file  . Sexual Activity: Not on file   Other Topics Concern  . Not on file   Social History Narrative   Patient lives at home with husband [name].   Patient has 2 children. One past away last year.    Patient is currently not working.    Patient has 2 years of college.    Patient is right handed.               PHYSICAL EXAM  Filed Vitals:   01/05/15 1515  BP: 175/85  Pulse: 81  Height: _0  (1.626 m)  Weight: 294 lb 6.4 oz (133.539 kg)   Body mass index is 50.51 kg/(m^2). Generalized: Well developed, morbidly obese female in no acute distress  Head: normocephalic and atraumatic,. Oropharynx benign  Neck: Supple, no carotid bruits  Cardiac: Regular rate rhythm,  no murmur  Musculoskeletal: No deformity   Neurological examination   Mentation: Alert oriented to time, place, history taking. Follows all commands speech and language fluent  Cranial nerve II-XII: Pupils were equal round reactive to light extraocular movements were full, visual field were full on confrontational test. Facial sensation and strength were normal. hearing was intact to  finger rubbing bilaterally. Uvula tongue midline. head turning and shoulder shrug were normal and symmetric.Tongue protrusion into cheek strength was normal. Motor: normal bulk and tone, full strength in the BUE, BLE, fine finger movements normal, no pronator drift. No focal weakness Coordination: finger-nose-finger, heel-to-shin bilaterally, no dysmetria, intention tremor with finger to nose finger bilaterally Reflexes: Brachioradialis 2/2, biceps 2/2, triceps 2/2, patellar 2/2, Achilles 2/2, plantar responses were flexor bilaterally. Gait and Station: Rising up from seated position without assistance, normal stance, moderate stride, good arm swing, smooth turning, able to perform tiptoe, and heel walking without difficulty. Tandem gait is steady   DIAGNOSTIC DATA (LABS, IMAGING, TESTING) -  ASSESSMENT AND PLAN  64 y.o. year old female  has a past medical history of  Diabetes mellitus, type 2 ([place]); Hyperlipemia; Anxiety; Depression; ; Benign essential tremor;  Morbidly obese ([place]). here to follow.   Continue primidone at current dose will refill Walk for exercise, this will be good for your anxiety as well as her diabetes  Follow-up in 6 months Dennie Bible, [facility], [facility], [facility] [address], [place], [place] [address] [phone]

## 2015-02-20 DIAGNOSIS — F410 Panic disorder [episodic paroxysmal anxiety] without agoraphobia: Secondary | ICD-10-CM | POA: Diagnosis not present

## 2015-02-23 DIAGNOSIS — Z23 Encounter for immunization: Secondary | ICD-10-CM | POA: Diagnosis not present

## 2015-02-23 DIAGNOSIS — I10 Essential (primary) hypertension: Secondary | ICD-10-CM | POA: Diagnosis not present

## 2015-04-03 DIAGNOSIS — E1129 Type 2 diabetes mellitus with other diabetic kidney complication: Secondary | ICD-10-CM | POA: Diagnosis not present

## 2015-04-03 DIAGNOSIS — I10 Essential (primary) hypertension: Secondary | ICD-10-CM | POA: Diagnosis not present

## 2015-04-03 DIAGNOSIS — E785 Hyperlipidemia, unspecified: Secondary | ICD-10-CM | POA: Diagnosis not present

## 2015-04-03 DIAGNOSIS — Z794 Long term (current) use of insulin: Secondary | ICD-10-CM | POA: Diagnosis not present

## 2015-04-03 DIAGNOSIS — R809 Proteinuria, unspecified: Secondary | ICD-10-CM | POA: Diagnosis not present

## 2015-06-17 DIAGNOSIS — B372 Candidiasis of skin and nail: Secondary | ICD-10-CM | POA: Diagnosis not present

## 2015-07-06 ENCOUNTER — Ambulatory Visit (INDEPENDENT_AMBULATORY_CARE_PROVIDER_SITE_OTHER): Payer: PPO | Admitting: Nurse Practitioner

## 2015-07-06 ENCOUNTER — Encounter: Payer: Self-pay | Admitting: Nurse Practitioner

## 2015-07-06 VITALS — BP 152/75 | HR 89 | Ht 64.0 in | Wt 294.0 lb

## 2015-07-06 DIAGNOSIS — G250 Essential tremor: Secondary | ICD-10-CM | POA: Diagnosis not present

## 2015-07-06 MED ORDER — PRIMIDONE 50 MG PO TABS: ORAL_TABLET | ORAL | Status: DC

## 2015-07-06 NOTE — Progress Notes (Signed)
[facility]  PATIENT: [name] DOB: 1950-10-22   REASON FOR VISIT: Follow-up for tremor HISTORY FROM: Patient    HISTORY OF PRESENT ILLNESS:[name], 65 year old female returns for followup.The patient reports her tremor is about the same. The patient has been on Mysoline taking 100 mg in the morning, 150 mg in the evening. The patient also takes alprazolam 1 mg 4 times daily. The patient denies any excessive daytime drowsiness, but she still has mild tremor but this is not bothersome and does not interfere with her ability to do her ADLs.The patient indicates that her father, the paternal grandmother, and several great aunts and uncles had tremor. The patient returns to this office for further evaluation.her most recent hemoglobin A1c is 6.2 which is an improvement   REVIEW OF SYSTEMS: Full 14 system review of systems performed and notable only for those listed, all others are neg:  Constitutional: neg  Cardiovascular: neg Ear/Nose/Throat: neg  Skin: neg Eyes: neg Respiratory: neg Gastroitestinal: neg  Hematology/Lymphatic: neg  Endocrine: neg Musculoskeletal:neg Allergy/Immunology: neg Neurological: Tremor  Psychiatric: neg Sleep : neg   ALLERGIES: Allergies  Allergen Reactions  . Codeine   . Demerol   . Hydrochlorothiazide     Nauseated, tremors get worse  . Meperidine Hcl   . Niacin-Lovastatin Er   . Topiramate     HOME MEDICATIONS: Outpatient Prescriptions Prior to Visit  Medication Sig Dispense Refill  . ALPRAZolam (XANAX) 1 MG tablet Take 1 mg by mouth 4 (four) times daily.     Marland Kitchen atorvastatin (LIPITOR) 40 MG tablet take 1 tablet by mouth once daily 30 tablet 0  . BD ULTRA-FINE PEN NEEDLES 29G X 12.7MM MISC USE FIVE TIMES DAILY FOR INSULIN INJECTIONS 400 each 2  . Blood Glucose Monitoring Suppl (ONE TOUCH ULTRA SYSTEM KIT) W/DEVICE KIT Use as instructed to check blood sugars four times a day ( Dx Code 250.020) 1 each 0  . Calcium  Carbonate-Vitamin D (CALCIUM 600+D) 600-400 MG-UNIT per tablet Take 1 tablet by mouth daily.      Marland Kitchen estradiol (VIVELLE-DOT) 0.1 MG/24HR Place 1 patch onto the skin 2 (two) times a week.    . ferrous sulfate 325 (65 FE) MG tablet Take 325 mg by mouth daily.      . furosemide (LASIX) 20 MG tablet take 1 tablet by mouth twice a day 30 tablet 2  . gabapentin (NEURONTIN) 300 MG capsule Take 300 mg by mouth 3 (three) times daily.     Marland Kitchen HUMALOG KWIKPEN 100 UNIT/ML SOPN INJECT 30 UNITS BEFORE BREAKFAST, 30 UNITS BEFORE LUNCH, AND 50 UNITS BEFORE DINNER (Patient taking differently: INJECT 60 UNITS BEFORE BREAKFAST, AND 70 UNITS BEFORE DINNER) 3 mL 3  . labetalol (NORMODYNE) 200 MG tablet Take 400 mg by mouth 2 (two) times daily.   0  . LEVEMIR FLEXTOUCH 100 UNIT/ML Pen   0  . mirtazapine (REMERON) 15 MG tablet Take 15 mg by mouth at bedtime.     . Omega-3 Fatty Acids (FISH OIL) 1000 MG CAPS Take 1,200 mg by mouth. Take 4 capsules daily.    . ONE TOUCH ULTRA TEST test strip USE 4 TIMES DAILY TO CHECK BLOOD SUGARS 300 each 3  . primidone (MYSOLINE) 50 MG tablet TAKE 2 TABLETS BY MOUTH IN THE MORNING AND 3 TABLETS AT NIGHT 150 tablet 6  . QUEtiapine (SEROQUEL) 400 MG tablet One half tablet in the morning and one tablet in the evening    . TRULICITY 5.97  MG/0.5ML SOPN Every 7 days (sunday)    . valsartan (DIOVAN) 320 MG tablet Take 320 mg by mouth daily.   0  . PARoxetine (PAXIL) 30 MG tablet Take 1 tablet (30 mg total) by mouth 2 (two) times daily. 60 tablet 3  . amLODipine (NORVASC) 2.5 MG tablet take 1 tablet by mouth once daily (Patient not taking: Reported on 07/06/2015) 90 tablet 1   No facility-administered medications prior to visit.    PAST MEDICAL HISTORY: Past Medical History  Diagnosis Date  . Pernicious anemia   . Diabetes mellitus, type 2 ([place])   . Hyperlipemia   . Anxiety   . Depression   . GERD (gastroesophageal reflux disease)   . Allergic rhinitis   . IBS (irritable bowel syndrome)    . Diverticulosis of colon   . Iron deficiency anemia   . Benign essential tremor   . CRI (chronic renal insufficiency)   . Essential and other specified forms of tremor 12/29/2012  . Morbidly obese ([place])     PAST SURGICAL HISTORY: Past Surgical History  Procedure Laterality Date  . Abdominal hysterectomy    . Cesarean section    . Tonsillectomy    . Bilateral oophorectomy      ovarian cysts    FAMILY HISTORY: Family History  Problem Relation Age of Onset  . Heart disease    . Diabetes    . Colitis      ulcerative  . Heart disease Mother   . Diabetes Father   . Hypertension Father   . Heart disease Sister   . Stroke Sister   . Diabetes Sister     SOCIAL HISTORY: Social History   Social History  . Marital Status: Married    Spouse Name: [name]   . Number of Children: 2  . Years of Education: 14   Occupational History  .     Social History Main Topics  . Smoking status: Never Smoker   . Smokeless tobacco: Never Used  . Alcohol Use: No  . Drug Use: Not on file  . Sexual Activity: Not on file   Other Topics Concern  . Not on file   Social History Narrative   Patient lives at home with husband [name].   Patient has 2 children. One past away last year.    Patient is currently not working.    Patient has 2 years of college.    Patient is right handed.               PHYSICAL EXAM  Filed Vitals:   07/06/15 1544  BP: 152/75  Pulse: 89  Height: _0  (1.626 m)  Weight: 294 lb (133.358 kg)   Body mass index is 50.44 kg/(m^2). Generalized: Well developed, morbidly obese female in no acute distress  Head: normocephalic and atraumatic,. Oropharynx benign  Neck: Supple, no carotid bruits  Cardiac: Regular rate rhythm, no murmur  Musculoskeletal: No deformity   Neurological examination   Mentation: Alert oriented to time, place, history taking. Follows all commands speech and language fluent  Cranial nerve II-XII: Pupils were equal round  reactive to light extraocular movements were full, visual field were full on confrontational test. Facial sensation and strength were normal. hearing was intact to finger rubbing bilaterally. Uvula tongue midline. head turning and shoulder shrug were normal and symmetric.Tongue protrusion into cheek strength was normal. Motor: normal bulk and tone, full strength in the BUE, BLE, fine finger movements normal, no pronator drift. No focal weakness  Coordination: finger-nose-finger, heel-to-shin bilaterally, no dysmetria, intention tremor with finger to nose finger bilaterally Reflexes: Brachioradialis 2/2, biceps 2/2, triceps 2/2, patellar 2/2, Achilles 2/2, plantar responses were flexor bilaterally. Gait and Station: Rising up from seated position without assistance, normal stance, moderate stride, good arm swing, smooth turning, able to perform tiptoe, and heel walking without difficulty. Tandem gait is steady DIAGNOSTIC DATA (LABS, IMAGING, TESTING)   ASSESSMENT AND PLAN  65 y.o. year old female  has a past medical history of  Diabetes mellitus, type 2 ([place]); Hyperlipemia; Anxiety; Depression; ; Benign essential tremor;  Morbidly obese ([place]). here to follow up for tremor. The patient is a current patient of Dr. Jannifer Franklin who is out of the office today . This note is sent to the work in doctor.     Continue primidone at current dose will refill Walk for exercise, this will be good for your anxiety as well as her diabetes   Encouraged her to join [employer] Follow-up in 6 months Dennie Bible, [facility], [facility], [facility] [address], [place], [place] [address] [phone]

## 2015-07-06 NOTE — Patient Instructions (Addendum)
Thank you for coming to [facility]. I hope we have been able to provide you with high quality care today You may receive a patient's satisfaction survey over the next few weeks we would appreciate your feedback and comments so that we can continue to improve ourselves and the  health of our patients Continue primidone at current dose will refill Walk for exercise, this will be good for your anxiety as well as her diabetes   Follow-up in 6 months

## 2015-07-08 NOTE — Progress Notes (Signed)
I reviewed note and agree with plan.   Penni Bombard, MD [id number], [id number] PM Certified in Neurology, Neurophysiology and Neuroimaging  [facility] [address], [place], [place] [address] [phone]

## 2015-07-28 DIAGNOSIS — I10 Essential (primary) hypertension: Secondary | ICD-10-CM | POA: Diagnosis not present

## 2015-07-28 DIAGNOSIS — Z794 Long term (current) use of insulin: Secondary | ICD-10-CM | POA: Diagnosis not present

## 2015-07-28 DIAGNOSIS — E785 Hyperlipidemia, unspecified: Secondary | ICD-10-CM | POA: Diagnosis not present

## 2015-07-28 DIAGNOSIS — E1129 Type 2 diabetes mellitus with other diabetic kidney complication: Secondary | ICD-10-CM | POA: Diagnosis not present

## 2015-07-28 DIAGNOSIS — E1165 Type 2 diabetes mellitus with hyperglycemia: Secondary | ICD-10-CM | POA: Diagnosis not present

## 2015-07-28 DIAGNOSIS — R809 Proteinuria, unspecified: Secondary | ICD-10-CM | POA: Diagnosis not present

## 2015-08-14 DIAGNOSIS — F410 Panic disorder [episodic paroxysmal anxiety] without agoraphobia: Secondary | ICD-10-CM | POA: Diagnosis not present

## 2015-08-24 DIAGNOSIS — E1129 Type 2 diabetes mellitus with other diabetic kidney complication: Secondary | ICD-10-CM | POA: Diagnosis not present

## 2015-08-24 DIAGNOSIS — E785 Hyperlipidemia, unspecified: Secondary | ICD-10-CM | POA: Diagnosis not present

## 2015-08-24 DIAGNOSIS — Z794 Long term (current) use of insulin: Secondary | ICD-10-CM | POA: Diagnosis not present

## 2015-08-24 DIAGNOSIS — R809 Proteinuria, unspecified: Secondary | ICD-10-CM | POA: Diagnosis not present

## 2015-08-24 DIAGNOSIS — I10 Essential (primary) hypertension: Secondary | ICD-10-CM | POA: Diagnosis not present

## 2015-08-24 DIAGNOSIS — Z0000 Encounter for general adult medical examination without abnormal findings: Secondary | ICD-10-CM | POA: Diagnosis not present

## 2015-09-23 DIAGNOSIS — J0140 Acute pansinusitis, unspecified: Secondary | ICD-10-CM | POA: Diagnosis not present

## 2015-10-02 DIAGNOSIS — Z1231 Encounter for screening mammogram for malignant neoplasm of breast: Secondary | ICD-10-CM | POA: Diagnosis not present

## 2015-10-23 DIAGNOSIS — H5203 Hypermetropia, bilateral: Secondary | ICD-10-CM | POA: Diagnosis not present

## 2015-10-23 DIAGNOSIS — H524 Presbyopia: Secondary | ICD-10-CM | POA: Diagnosis not present

## 2015-10-23 DIAGNOSIS — E119 Type 2 diabetes mellitus without complications: Secondary | ICD-10-CM | POA: Diagnosis not present

## 2015-10-23 DIAGNOSIS — Z794 Long term (current) use of insulin: Secondary | ICD-10-CM | POA: Diagnosis not present

## 2015-10-23 DIAGNOSIS — H2513 Age-related nuclear cataract, bilateral: Secondary | ICD-10-CM | POA: Diagnosis not present

## 2015-10-31 DIAGNOSIS — E1165 Type 2 diabetes mellitus with hyperglycemia: Secondary | ICD-10-CM | POA: Diagnosis not present

## 2015-10-31 DIAGNOSIS — R809 Proteinuria, unspecified: Secondary | ICD-10-CM | POA: Diagnosis not present

## 2015-10-31 DIAGNOSIS — E785 Hyperlipidemia, unspecified: Secondary | ICD-10-CM | POA: Diagnosis not present

## 2015-10-31 DIAGNOSIS — Z794 Long term (current) use of insulin: Secondary | ICD-10-CM | POA: Diagnosis not present

## 2015-10-31 DIAGNOSIS — E1129 Type 2 diabetes mellitus with other diabetic kidney complication: Secondary | ICD-10-CM | POA: Diagnosis not present

## 2015-10-31 DIAGNOSIS — I10 Essential (primary) hypertension: Secondary | ICD-10-CM | POA: Diagnosis not present

## 2016-01-08 ENCOUNTER — Encounter: Payer: Self-pay | Admitting: Nurse Practitioner

## 2016-01-08 ENCOUNTER — Ambulatory Visit (INDEPENDENT_AMBULATORY_CARE_PROVIDER_SITE_OTHER): Payer: PPO | Admitting: Nurse Practitioner

## 2016-01-08 VITALS — BP 148/74 | HR 71 | Ht 64.0 in | Wt 270.8 lb

## 2016-01-08 DIAGNOSIS — G250 Essential tremor: Secondary | ICD-10-CM

## 2016-01-08 DIAGNOSIS — F411 Generalized anxiety disorder: Secondary | ICD-10-CM

## 2016-01-08 DIAGNOSIS — F329 Major depressive disorder, single episode, unspecified: Secondary | ICD-10-CM

## 2016-01-08 DIAGNOSIS — F32A Depression, unspecified: Secondary | ICD-10-CM

## 2016-01-08 MED ORDER — PRIMIDONE 50 MG PO TABS: ORAL_TABLET | ORAL | 8 refills | Status: DC

## 2016-01-08 NOTE — Patient Instructions (Signed)
Continue primidone at current dose will refill Congratulations on your weight loss of 24 pounds continue the good  work Walk for exercise, this will be good for your anxiety as well as her diabetes   Follow-up in 6 months

## 2016-01-08 NOTE — Progress Notes (Signed)
[facility]  PATIENT: [name] DOB: [birth year]   REASON FOR VISIT: Follow-up for tremor HISTORY FROM: Patient    HISTORY OF PRESENT ILLNESS:[name], 65 year old female returns for followup.The patient reports her tremor is about the same. The patient has been on Mysoline taking 100 mg in the morning, 150 mg in the evening. The patient also takes alprazolam 1 mg 4 times daily. The patient denies any excessive daytime drowsiness, but she still has mild tremor but this is not bothersome and does not interfere with her ability to do her ADLs.The patient indicates that her father, the paternal grandmother, and several great aunts and uncles had tremor. The patient returns to this office for further evaluation. She has been on a diet for the last 6 months and has lost  24 pounds   REVIEW OF SYSTEMS: Full 14 system review of systems performed and notable only for those listed, all others are neg:  Constitutional: neg  Cardiovascular: neg Ear/Nose/Throat: neg  Skin: neg Eyes: neg Respiratory: neg Gastroitestinal: neg  Hematology/Lymphatic: neg  Endocrine: neg Musculoskeletal:neg Allergy/Immunology: neg Neurological: Tremor  Psychiatric: Depression and anxiety Sleep : neg   ALLERGIES: Allergies  Allergen Reactions  . Codeine   . Demerol   . Hydrochlorothiazide     Nauseated, tremors get worse  . Meperidine Hcl   . Niacin-Lovastatin Er   . Topiramate     HOME MEDICATIONS: Outpatient Medications Prior to Visit  Medication Sig Dispense Refill  . ALPRAZolam (XANAX) 1 MG tablet Take 1 mg by mouth 4 (four) times daily.     Marland Kitchen amLODipine (NORVASC) 5 MG tablet Take 5 mg by mouth daily.   0  . atorvastatin (LIPITOR) 40 MG tablet take 1 tablet by mouth once daily 30 tablet 0  . BD ULTRA-FINE PEN NEEDLES 29G X 12.7MM MISC USE FIVE TIMES DAILY FOR INSULIN INJECTIONS 400 each 2  . Blood Glucose Monitoring Suppl (ONE TOUCH ULTRA SYSTEM KIT) W/DEVICE KIT Use  as instructed to check blood sugars four times a day ( Dx Code 250.020) 1 each 0  . Calcium Carbonate-Vitamin D (CALCIUM 600+D) 600-400 MG-UNIT per tablet Take 1 tablet by mouth daily.      Marland Kitchen estradiol (VIVELLE-DOT) 0.1 MG/24HR Place 1 patch onto the skin 2 (two) times a week.    . ferrous sulfate 325 (65 FE) MG tablet Take 325 mg by mouth daily.      . furosemide (LASIX) 20 MG tablet take 1 tablet by mouth twice a day 30 tablet 2  . gabapentin (NEURONTIN) 300 MG capsule Take 300 mg by mouth 3 (three) times daily.     Marland Kitchen HUMALOG KWIKPEN 100 UNIT/ML SOPN INJECT 30 UNITS BEFORE BREAKFAST, 30 UNITS BEFORE LUNCH, AND 50 UNITS BEFORE DINNER (Patient taking differently: INJECT 60 UNITS BEFORE BREAKFAST, AND 70 UNITS BEFORE DINNER) 3 mL 3  . labetalol (NORMODYNE) 200 MG tablet Take 400 mg by mouth 2 (two) times daily.   0  . LEVEMIR FLEXTOUCH 100 UNIT/ML Pen   0  . mirtazapine (REMERON) 15 MG tablet Take 15 mg by mouth at bedtime.     . Omega-3 Fatty Acids (FISH OIL) 1000 MG CAPS Take 1,200 mg by mouth. Take 4 capsules daily.    . ONE TOUCH ULTRA TEST test strip USE 4 TIMES DAILY TO CHECK BLOOD SUGARS 300 each 3  . primidone (MYSOLINE) 50 MG tablet TAKE 2 TABLETS BY MOUTH IN THE MORNING AND 3 TABLETS AT NIGHT 150 tablet 6  .  QUEtiapine (SEROQUEL) 400 MG tablet One half tablet in the morning and one tablet in the evening    . TRULICITY 0.75 MG/0.5ML SOPN Every 7 days (sunday)    . valsartan (DIOVAN) 320 MG tablet Take 320 mg by mouth daily.   0  . PARoxetine (PAXIL) 30 MG tablet Take 1 tablet (30 mg total) by mouth 2 (two) times daily. 60 tablet 3  . NYAMYC powder apply to affected area twice a day for 14 days  0   No facility-administered medications prior to visit.     PAST MEDICAL HISTORY: Past Medical History:  Diagnosis Date  . Allergic rhinitis   . Anxiety   . Benign essential tremor   . CRI (chronic renal insufficiency)   . Depression   . Diabetes mellitus, type 2 ([facility])   .  Diverticulosis of colon   . Essential and other specified forms of tremor 12/29/2012  . GERD (gastroesophageal reflux disease)   . Hyperlipemia   . IBS (irritable bowel syndrome)   . Iron deficiency anemia   . Morbidly obese ([facility])   . Pernicious anemia     PAST SURGICAL HISTORY: Past Surgical History:  Procedure Laterality Date  . ABDOMINAL HYSTERECTOMY    . BILATERAL OOPHORECTOMY     ovarian cysts  . CESAREAN SECTION    . TONSILLECTOMY      FAMILY HISTORY: Family History  Problem Relation Age of Onset  . Heart disease Mother   . Diabetes Father   . Hypertension Father   . Heart disease Sister   . Stroke Sister   . Diabetes Sister   . Heart disease    . Diabetes    . Colitis      ulcerative    SOCIAL HISTORY: Social History   Social History  . Marital status: Married    Spouse name: Raeford   . Number of children: 2  . Years of education: 14   Occupational History  .  Unemployed   Social History Main Topics  . Smoking status: Never Smoker  . Smokeless tobacco: Never Used  . Alcohol use No  . Drug use: Unknown  . Sexual activity: Not on file   Other Topics Concern  . Not on file   Social History Narrative   Patient lives at home with husband Raeford.   Patient has 2 children. One past away last year.    Patient is currently not working.    Patient has 2 years of college.    Patient is right handed.               PHYSICAL EXAM  Vitals:   01/08/16 1556  BP: (!) 148/74  Pulse: 71  Weight: 270 lb 12.8 oz (122.8 kg)  Height: 5\' 4"  (1.626 m)   Body mass index is 46.48 kg/m. Generalized: Well developed, morbidly obese female in no acute distress  Head: normocephalic and atraumatic,. Oropharynx benign  Neck: Supple, no carotid bruits  Cardiac: Regular rate rhythm, no murmur  Musculoskeletal: No deformity   Neurological examination   Mentation: Alert oriented to time, place, history taking. Follows all commands speech and language  fluent  Cranial nerve II-XII: Pupils were equal round reactive to light extraocular movements were full, visual field were full on confrontational test. Facial sensation and strength were normal. hearing was intact to finger rubbing bilaterally. Uvula tongue midline. head turning and shoulder shrug were normal and symmetric.Tongue protrusion into cheek strength was normal. Motor: normal bulk and tone, full  strength in the BUE, BLE, fine finger movements normal, no pronator drift. No focal weakness Coordination: finger-nose-finger, heel-to-shin bilaterally, no dysmetria, intention tremor with finger to nose finger bilaterally Reflexes: Brachioradialis 2/2, biceps 2/2, triceps 2/2, patellar 2/2, Achilles 2/2, plantar responses were flexor bilaterally. Gait and Station: Rising up from seated position without assistance, normal stance, moderate stride, good arm swing, smooth turning, able to perform tiptoe, and heel walking without difficulty. Tandem gait is steady DIAGNOSTIC DATA (LABS, IMAGING, TESTING)   ASSESSMENT AND PLAN  65 y.o. year old female  has a past medical history of  Diabetes mellitus, type 2 ([place]); Hyperlipemia; Anxiety; Depression; ; Benign essential tremor;  Morbidly obese ([place]). here to follow up for tremor.  Continue primidone at current dose will refill Congratulations on your weight loss of 24 pounds continue the good  work Walk for exercise, this will be good for your anxiety as well as her diabetes   Follow-up in 6 months Dennie Bible, [facility], [facility], [facility] [address], [place], [place] [address] [phone]

## 2016-01-08 NOTE — Progress Notes (Signed)
I have read the note, and I agree with the clinical assessment and plan.  Brionne Mertz KEITH   

## 2016-01-19 DIAGNOSIS — Z01419 Encounter for gynecological examination (general) (routine) without abnormal findings: Secondary | ICD-10-CM | POA: Diagnosis not present

## 2016-02-01 DIAGNOSIS — I10 Essential (primary) hypertension: Secondary | ICD-10-CM | POA: Diagnosis not present

## 2016-02-01 DIAGNOSIS — E1129 Type 2 diabetes mellitus with other diabetic kidney complication: Secondary | ICD-10-CM | POA: Diagnosis not present

## 2016-02-01 DIAGNOSIS — E785 Hyperlipidemia, unspecified: Secondary | ICD-10-CM | POA: Diagnosis not present

## 2016-02-01 DIAGNOSIS — R809 Proteinuria, unspecified: Secondary | ICD-10-CM | POA: Diagnosis not present

## 2016-02-01 DIAGNOSIS — Z794 Long term (current) use of insulin: Secondary | ICD-10-CM | POA: Diagnosis not present

## 2016-02-27 DIAGNOSIS — E785 Hyperlipidemia, unspecified: Secondary | ICD-10-CM | POA: Diagnosis not present

## 2016-02-27 DIAGNOSIS — N183 Chronic kidney disease, stage 3 (moderate): Secondary | ICD-10-CM | POA: Diagnosis not present

## 2016-02-27 DIAGNOSIS — Z23 Encounter for immunization: Secondary | ICD-10-CM | POA: Diagnosis not present

## 2016-02-27 DIAGNOSIS — I129 Hypertensive chronic kidney disease with stage 1 through stage 4 chronic kidney disease, or unspecified chronic kidney disease: Secondary | ICD-10-CM | POA: Diagnosis not present

## 2016-04-08 DIAGNOSIS — N183 Chronic kidney disease, stage 3 (moderate): Secondary | ICD-10-CM | POA: Diagnosis not present

## 2016-04-08 DIAGNOSIS — I129 Hypertensive chronic kidney disease with stage 1 through stage 4 chronic kidney disease, or unspecified chronic kidney disease: Secondary | ICD-10-CM | POA: Diagnosis not present

## 2016-04-08 DIAGNOSIS — E785 Hyperlipidemia, unspecified: Secondary | ICD-10-CM | POA: Diagnosis not present

## 2016-05-02 DIAGNOSIS — E1129 Type 2 diabetes mellitus with other diabetic kidney complication: Secondary | ICD-10-CM | POA: Diagnosis not present

## 2016-05-02 DIAGNOSIS — Z794 Long term (current) use of insulin: Secondary | ICD-10-CM | POA: Diagnosis not present

## 2016-05-02 DIAGNOSIS — I10 Essential (primary) hypertension: Secondary | ICD-10-CM | POA: Diagnosis not present

## 2016-05-02 DIAGNOSIS — E785 Hyperlipidemia, unspecified: Secondary | ICD-10-CM | POA: Diagnosis not present

## 2016-05-02 DIAGNOSIS — R809 Proteinuria, unspecified: Secondary | ICD-10-CM | POA: Diagnosis not present

## 2016-05-14 DIAGNOSIS — I10 Essential (primary) hypertension: Secondary | ICD-10-CM | POA: Diagnosis not present

## 2016-05-14 DIAGNOSIS — Z9889 Other specified postprocedural states: Secondary | ICD-10-CM | POA: Diagnosis not present

## 2016-05-14 DIAGNOSIS — Z90710 Acquired absence of both cervix and uterus: Secondary | ICD-10-CM | POA: Diagnosis not present

## 2016-05-14 DIAGNOSIS — K635 Polyp of colon: Secondary | ICD-10-CM | POA: Diagnosis not present

## 2016-05-14 DIAGNOSIS — D127 Benign neoplasm of rectosigmoid junction: Secondary | ICD-10-CM | POA: Diagnosis not present

## 2016-05-14 DIAGNOSIS — D125 Benign neoplasm of sigmoid colon: Secondary | ICD-10-CM | POA: Diagnosis not present

## 2016-05-14 DIAGNOSIS — Z79899 Other long term (current) drug therapy: Secondary | ICD-10-CM | POA: Diagnosis not present

## 2016-05-14 DIAGNOSIS — Z1211 Encounter for screening for malignant neoplasm of colon: Secondary | ICD-10-CM | POA: Diagnosis not present

## 2016-05-14 DIAGNOSIS — K5730 Diverticulosis of large intestine without perforation or abscess without bleeding: Secondary | ICD-10-CM | POA: Diagnosis not present

## 2016-05-14 DIAGNOSIS — Z794 Long term (current) use of insulin: Secondary | ICD-10-CM | POA: Diagnosis not present

## 2016-05-14 DIAGNOSIS — E785 Hyperlipidemia, unspecified: Secondary | ICD-10-CM | POA: Diagnosis not present

## 2016-05-14 DIAGNOSIS — E1121 Type 2 diabetes mellitus with diabetic nephropathy: Secondary | ICD-10-CM | POA: Diagnosis not present

## 2016-05-14 DIAGNOSIS — K648 Other hemorrhoids: Secondary | ICD-10-CM | POA: Diagnosis not present

## 2016-07-08 ENCOUNTER — Encounter: Payer: Self-pay | Admitting: Nurse Practitioner

## 2016-07-08 ENCOUNTER — Ambulatory Visit (INDEPENDENT_AMBULATORY_CARE_PROVIDER_SITE_OTHER): Payer: PPO | Admitting: Nurse Practitioner

## 2016-07-08 VITALS — BP 165/89 | HR 77 | Ht 64.0 in | Wt 281.2 lb

## 2016-07-08 DIAGNOSIS — G250 Essential tremor: Secondary | ICD-10-CM

## 2016-07-08 MED ORDER — PRIMIDONE 50 MG PO TABS: ORAL_TABLET | ORAL | 1 refills | Status: DC

## 2016-07-08 NOTE — Progress Notes (Signed)
I have read the note, and I agree with the clinical assessment and plan.  [name] KEITH   

## 2016-07-08 NOTE — Progress Notes (Signed)
[facility]  PATIENT: [name] DOB: August 04, 1950   REASON FOR VISIT: Follow-up for tremor HISTORY FROM: Patient and husband [place] ILLNESS:[name], 66 year old female returns for followup with essential tremor.The patient reports her tremor is about the same. The patient has been on Mysoline taking 100 mg in the morning, 150 mg in the evening. The patient also takes alprazolam 1 mg 4 times daily. The patient denies any excessive daytime drowsiness, but she still has mild tremor but this is not bothersome and does not interfere with her ability to do her ADLs.The patient indicates that her father, the paternal grandmother, and several great aunts and uncles had tremor. The patient returns to this office for further evaluation. She has recently gotten off of her diet and has gained back some of the weight  she lost . She does not exercise. She returns for reevaluation   REVIEW OF SYSTEMS: Full 14 system review of systems performed and notable only for those listed, all others are neg:  Constitutional: neg  Cardiovascular: neg Ear/Nose/Throat: neg  Skin: neg Eyes: neg Respiratory: neg Gastroitestinal: neg  Hematology/Lymphatic: neg  Endocrine: neg Musculoskeletal:neg Allergy/Immunology: neg Neurological: Tremor  Psychiatric: Depression and anxiety Sleep : Snoring   ALLERGIES: Allergies  Allergen Reactions  . Codeine   . Demerol   . Hydrochlorothiazide     Nauseated, tremors get worse  . Meperidine Hcl   . Niacin-Lovastatin Er   . Topiramate     HOME MEDICATIONS: Outpatient Medications Prior to Visit  Medication Sig Dispense Refill  . ALPRAZolam (XANAX) 1 MG tablet Take 1 mg by mouth 4 (four) times daily.     Marland Kitchen amLODipine (NORVASC) 5 MG tablet Take 5 mg by mouth daily.   0  . atorvastatin (LIPITOR) 40 MG tablet take 1 tablet by mouth once daily 30 tablet 0  . BD ULTRA-FINE PEN NEEDLES 29G X 12.7MM MISC USE FIVE TIMES  DAILY FOR INSULIN INJECTIONS 400 each 2  . Blood Glucose Monitoring Suppl (ONE TOUCH ULTRA SYSTEM KIT) W/DEVICE KIT Use as instructed to check blood sugars four times a day ( Dx Code 250.020) 1 each 0  . Calcium Carbonate-Vitamin D (CALCIUM 600+D) 600-400 MG-UNIT per tablet Take 1 tablet by mouth daily.      Marland Kitchen estradiol (VIVELLE-DOT) 0.1 MG/24HR Place 1 patch onto the skin 2 (two) times a week.    . ferrous sulfate 325 (65 FE) MG tablet Take 325 mg by mouth daily.      . furosemide (LASIX) 20 MG tablet take 1 tablet by mouth twice a day 30 tablet 2  . gabapentin (NEURONTIN) 300 MG capsule Take 300 mg by mouth 3 (three) times daily.     Marland Kitchen HUMALOG KWIKPEN 100 UNIT/ML SOPN INJECT 30 UNITS BEFORE BREAKFAST, 30 UNITS BEFORE LUNCH, AND 50 UNITS BEFORE DINNER (Patient taking differently: INJECT 60 UNITS BEFORE BREAKFAST, AND 70 UNITS BEFORE DINNER) 3 mL 3  . labetalol (NORMODYNE) 200 MG tablet Take 400 mg by mouth 2 (two) times daily.   0  . LEVEMIR FLEXTOUCH 100 UNIT/ML Pen   0  . mirtazapine (REMERON) 15 MG tablet Take 15 mg by mouth at bedtime.     . Omega-3 Fatty Acids (FISH OIL) 1000 MG CAPS Take 1,200 mg by mouth. Take 4 capsules daily.    . ONE TOUCH ULTRA TEST test strip USE 4 TIMES DAILY TO CHECK BLOOD SUGARS 300 each 3  . primidone (MYSOLINE) 50 MG  tablet TAKE 2 TABLETS BY MOUTH IN THE MORNING AND 3 TABLETS AT NIGHT 150 tablet 8  . QUEtiapine (SEROQUEL) 400 MG tablet One half tablet in the morning and one tablet in the evening    . TRULICITY 4.31 VQ/0.0QQ SOPN Every 7 days (sunday)    . valsartan (DIOVAN) 320 MG tablet Take 320 mg by mouth daily.   0  . PARoxetine (PAXIL) 30 MG tablet Take 1 tablet (30 mg total) by mouth 2 (two) times daily. 60 tablet 3   No facility-administered medications prior to visit.     PAST MEDICAL HISTORY: Past Medical History:  Diagnosis Date  . Allergic rhinitis   . Anxiety   . Benign essential tremor   . CRI (chronic renal insufficiency)   . Depression     . Diabetes mellitus, type 2 ([place])   . Diverticulosis of colon   . Essential and other specified forms of tremor 12/29/2012  . GERD (gastroesophageal reflux disease)   . Hyperlipemia   . IBS (irritable bowel syndrome)   . Iron deficiency anemia   . Morbidly obese ([place])   . Pernicious anemia     PAST SURGICAL HISTORY: Past Surgical History:  Procedure Laterality Date  . ABDOMINAL HYSTERECTOMY    . BILATERAL OOPHORECTOMY     ovarian cysts  . CESAREAN SECTION    . TONSILLECTOMY      FAMILY HISTORY: Family History  Problem Relation Age of Onset  . Heart disease Mother   . Diabetes Father   . Hypertension Father   . Heart disease Sister   . Stroke Sister   . Diabetes Sister   . Heart disease Unknown   . Diabetes Unknown   . Colitis Unknown        ulcerative    SOCIAL HISTORY: Social History   Social History  . Marital status: Married    Spouse name: Raeford   . Number of children: 2  . Years of education: 14   Occupational History  .  Unemployed   Social History Main Topics  . Smoking status: Never Smoker  . Smokeless tobacco: Never Used  . Alcohol use No  . Drug use: Unknown  . Sexual activity: Not on file   Other Topics Concern  . Not on file   Social History Narrative   Patient lives at home with husband Raeford.   Patient has 2 children. One past away last year.    Patient is currently not working.    Patient has 2 years of college.    Patient is right handed.               PHYSICAL EXAM  Vitals:   07/08/16 1536  BP: (!) 165/89  Pulse: 77  Weight: 281 lb 3.2 oz (127.6 kg)  Height: '5\' 4"'$  (1.626 m)   Body mass index is 48.27 kg/m. Generalized: Well developed, morbidly obese female in no acute distress  Head: normocephalic and atraumatic,. Oropharynx benign  Neck: Supple, no carotid bruits  Cardiac: Regular rate rhythm, no murmur  Musculoskeletal: No deformity   Neurological examination   Mentation: Alert oriented to time,  place, history taking. Follows all commands speech and language fluent  Cranial nerve II-XII: Pupils were equal round reactive to light extraocular movements were full, visual field were full on confrontational test. Facial sensation and strength were normal. hearing was intact to finger rubbing bilaterally. Uvula tongue midline. head turning and shoulder shrug were normal and symmetric.Tongue protrusion into cheek strength was  normal. Motor: normal bulk and tone, full strength in the BUE, BLE, fine finger movements normal, no pronator drift. No focal weakness. Mild head tremor noted Coordination: finger-nose-finger, heel-to-shin bilaterally, no dysmetria, intention tremor with finger to nose finger bilaterally Reflexes: Brachioradialis 2/2, biceps 2/2, triceps 2/2, patellar 2/2, Achilles 2/2, plantar responses were flexor bilaterally. Gait and Station: Rising up from seated position without assistance, normal stance, moderate stride, good arm swing, smooth turning, able to perform tiptoe, and heel walking without difficulty. Tandem gait is steady DIAGNOSTIC DATA (LABS, IMAGING, TESTING)   ASSESSMENT AND PLAN  66 y.o. year old female  has a past medical history of  Diabetes mellitus, type 2 ([place]); Hyperlipemia; Anxiety; Depression; ; Benign essential tremor;  Morbidly obese ([place]). here to follow up for tremor.  Continue primidone at current dose will refill Walk for exercise, at least 30 min every day this will be good for your anxiety as well as your  diabetes and B/P  Join weight watchers for slow steady weight loss Given copy of Mediterranean diet  Continue follow-up with psychiatry for depression   Follow-up in 6 months I spent 25 minutes in total face to face time with the patient more than 50% of which was spent counseling and coordination of care, reviewing test results reviewing medications and discussing and reviewing the diagnosis of tremor and also her compulsive eating. She needs a  rigid schedule and husband  is agreeable to help. Discussed portion control and reviewed Mediterranean diet. May need sleep study Dennie Bible, [facility], [facility], APRN  [facility] [address] [place], [place] [address] [phone]

## 2016-07-08 NOTE — Patient Instructions (Addendum)
Continue primidone at current dose will refill Walk for exercise, at least 30 min every day this will be good for your anxiety as well as her diabetes and B/P  Join weight watchers for slow steady weight loss Given copy of Mediterranean diet   Follow-up in 6 months  Mediterranean Diet A Mediterranean diet refers to food and lifestyle choices that are based on the traditions of countries located on the [employer]. This way of eating has been shown to help prevent certain conditions and improve outcomes for people who have chronic diseases, like kidney disease and heart disease. What are tips for following this plan? Lifestyle  Cook and eat meals together with your family, when possible.  Drink enough fluid to keep your urine clear or pale yellow.  Be physically active every day. This includes: ? Aerobic exercise like running or swimming. ? Leisure activities like gardening, walking, or housework.  Get 7-8 hours of sleep each night.  If recommended by your health care provider, drink red wine in moderation. This means 1 glass a day for nonpregnant women and 2 glasses a day for men. A glass of wine equals 5 oz (150 mL). Reading food labels  Check the serving size of packaged foods. For foods such as rice and pasta, the serving size refers to the amount of cooked product, not dry.  Check the total fat in packaged foods. Avoid foods that have saturated fat or trans fats.  Check the ingredients list for added sugars, such as corn syrup. Shopping  At the grocery store, buy most of your food from the areas near the walls of the store. This includes: ? Fresh fruits and vegetables (produce). ? Grains, beans, nuts, and seeds. Some of these may be available in unpackaged forms or large amounts (in bulk). ? Fresh seafood. ? Poultry and eggs. ? Low-fat dairy products.  Buy whole ingredients instead of prepackaged foods.  Buy fresh fruits and vegetables in-season from local farmers  markets.  Buy frozen fruits and vegetables in resealable bags.  If you do not have access to quality fresh seafood, buy precooked frozen shrimp or canned fish, such as tuna, salmon, or sardines.  Buy small amounts of raw or cooked vegetables, salads, or olives from the deli or salad bar at your store.  Stock your pantry so you always have certain foods on hand, such as olive oil, canned tuna, canned tomatoes, rice, pasta, and beans. Cooking  Cook foods with extra-virgin olive oil instead of using butter or other vegetable oils.  Have meat as a side dish, and have vegetables or grains as your main dish. This means having meat in small portions or adding small amounts of meat to foods like pasta or stew.  Use beans or vegetables instead of meat in common dishes like chili or lasagna.  Experiment with different cooking methods. Try roasting or broiling vegetables instead of steaming or sauteing them.  Add frozen vegetables to soups, stews, pasta, or rice.  Add nuts or seeds for added healthy fat at each meal. You can add these to yogurt, salads, or vegetable dishes.  Marinate fish or vegetables using olive oil, lemon juice, garlic, and fresh herbs. Meal planning  Plan to eat 1 vegetarian meal one day each week. Try to work up to 2 vegetarian meals, if possible.  Eat seafood 2 or more times a week.  Have healthy snacks readily available, such as: ? Vegetable sticks with hummus. ? [place] yogurt. ? Fruit and nut trail  mix.  Eat balanced meals throughout the week. This includes: ? Fruit: 2-3 servings a day ? Vegetables: 4-5 servings a day ? Low-fat dairy: 2 servings a day ? Fish, poultry, or lean meat: 1 serving a day ? Beans and legumes: 2 or more servings a week ? Nuts and seeds: 1-2 servings a day ? Whole grains: 6-8 servings a day ? Extra-virgin olive oil: 3-4 servings a day  Limit red meat and sweets to only a few servings a month What are my food choices?  Mediterranean  diet ? Recommended ? Grains: Whole-grain pasta. Brown rice. Bulgar wheat. Polenta. Couscous. Whole-wheat bread. [place]. ? Vegetables: Artichokes. Beets. Broccoli. Cabbage. Carrots. Eggplant. Green beans. Chard. Kale. Spinach. Onions. Leeks. Peas. Squash. Tomatoes. Peppers. Radishes. ? Fruits: Apples. Apricots. Avocado. Berries. Bananas. Cherries. Dates. Figs. Grapes. Lemons. Melon. Oranges. Peaches. Plums. Pomegranate. ? Meats and other protein foods: Beans. Almonds. Sunflower seeds. Pine nuts. Peanuts. [place]. Salmon. [place]. Shrimp. [place]. Tilapia. Clams. Oysters. Eggs. ? Dairy: Low-fat milk. Cheese. Greek yogurt. ? Beverages: Water. Red wine. Herbal tea. ? Fats and oils: Extra virgin olive oil. Avocado oil. Grape seed oil. ? Sweets and desserts: [place] yogurt with honey. Baked apples. Poached pears. Trail mix. ? Seasoning and other foods: Basil. Cilantro. Coriander. Cumin. Mint. Parsley. Sage. Rosemary. Tarragon. Garlic. Oregano. Thyme. Pepper. Balsalmic vinegar. Tahini. Hummus. Tomato sauce. Olives. Mushrooms. ? Limit these ? Grains: Prepackaged pasta or rice dishes. Prepackaged cereal with added sugar. ? Vegetables: Deep fried potatoes (french fries). ? Fruits: Fruit canned in syrup. ? Meats and other protein foods: Beef. Pork. Lamb. Poultry with skin. Hot dogs. Bernie[place] Salines. ? Dairy: Ice cream. Sour cream. Whole milk. ? Beverages: Juice. Sugar-sweetened soft drinks. Beer. Liquor and spirits. ? Fats and oils: Butter. Canola oil. Vegetable oil. Beef fat (tallow). Lard. ? Sweets and desserts: Cookies. Cakes. Pies. Candy. ? Seasoning and other foods: Mayonnaise. Premade sauces and marinades. ? The items listed may not be a complete list. Talk with your dietitian about what dietary choices are right for you. Summary  The Mediterranean diet includes both food and lifestyle choices.  Eat a variety of fresh fruits and vegetables, beans, nuts, seeds, and whole grains.  Limit the amount of red  meat and sweets that you eat.  Talk with your health care provider about whether it is safe for you to drink red wine in moderation. This means 1 glass a day for nonpregnant women and 2 glasses a day for men. A glass of wine equals 5 oz (150 mL). This information is not intended to replace advice given to you by your health care provider. Make sure you discuss any questions you have with your health care provider. Document Released: 08/31/2015 Document Revised: 10/03/2015 Document Reviewed: 08/31/2015 Elsevier Interactive Patient Education  [employer].

## 2016-08-29 DIAGNOSIS — R809 Proteinuria, unspecified: Secondary | ICD-10-CM | POA: Diagnosis not present

## 2016-08-29 DIAGNOSIS — Z0000 Encounter for general adult medical examination without abnormal findings: Secondary | ICD-10-CM | POA: Diagnosis not present

## 2016-08-29 DIAGNOSIS — Z794 Long term (current) use of insulin: Secondary | ICD-10-CM | POA: Diagnosis not present

## 2016-08-29 DIAGNOSIS — E1129 Type 2 diabetes mellitus with other diabetic kidney complication: Secondary | ICD-10-CM | POA: Diagnosis not present

## 2016-08-29 DIAGNOSIS — Z1159 Encounter for screening for other viral diseases: Secondary | ICD-10-CM | POA: Diagnosis not present

## 2016-08-29 DIAGNOSIS — B372 Candidiasis of skin and nail: Secondary | ICD-10-CM | POA: Diagnosis not present

## 2016-08-29 DIAGNOSIS — I10 Essential (primary) hypertension: Secondary | ICD-10-CM | POA: Diagnosis not present

## 2016-08-29 DIAGNOSIS — Z1231 Encounter for screening mammogram for malignant neoplasm of breast: Secondary | ICD-10-CM | POA: Diagnosis not present

## 2016-09-03 DIAGNOSIS — E1129 Type 2 diabetes mellitus with other diabetic kidney complication: Secondary | ICD-10-CM | POA: Diagnosis not present

## 2016-09-03 DIAGNOSIS — I10 Essential (primary) hypertension: Secondary | ICD-10-CM | POA: Diagnosis not present

## 2016-09-03 DIAGNOSIS — Z794 Long term (current) use of insulin: Secondary | ICD-10-CM | POA: Diagnosis not present

## 2016-09-03 DIAGNOSIS — R809 Proteinuria, unspecified: Secondary | ICD-10-CM | POA: Diagnosis not present

## 2016-09-03 DIAGNOSIS — E785 Hyperlipidemia, unspecified: Secondary | ICD-10-CM | POA: Diagnosis not present

## 2016-10-28 DIAGNOSIS — Z1231 Encounter for screening mammogram for malignant neoplasm of breast: Secondary | ICD-10-CM | POA: Diagnosis not present

## 2016-11-01 DIAGNOSIS — N6002 Solitary cyst of left breast: Secondary | ICD-10-CM | POA: Diagnosis not present

## 2016-11-01 DIAGNOSIS — R921 Mammographic calcification found on diagnostic imaging of breast: Secondary | ICD-10-CM | POA: Diagnosis not present

## 2016-11-04 DIAGNOSIS — E119 Type 2 diabetes mellitus without complications: Secondary | ICD-10-CM | POA: Diagnosis not present

## 2016-11-04 DIAGNOSIS — H25013 Cortical age-related cataract, bilateral: Secondary | ICD-10-CM | POA: Diagnosis not present

## 2016-11-04 DIAGNOSIS — Z794 Long term (current) use of insulin: Secondary | ICD-10-CM | POA: Diagnosis not present

## 2016-11-04 DIAGNOSIS — H2513 Age-related nuclear cataract, bilateral: Secondary | ICD-10-CM | POA: Diagnosis not present

## 2016-11-07 DIAGNOSIS — R928 Other abnormal and inconclusive findings on diagnostic imaging of breast: Secondary | ICD-10-CM | POA: Diagnosis not present

## 2016-11-07 DIAGNOSIS — Z9889 Other specified postprocedural states: Secondary | ICD-10-CM | POA: Diagnosis not present

## 2016-11-14 DIAGNOSIS — R921 Mammographic calcification found on diagnostic imaging of breast: Secondary | ICD-10-CM | POA: Diagnosis not present

## 2016-11-14 DIAGNOSIS — R928 Other abnormal and inconclusive findings on diagnostic imaging of breast: Secondary | ICD-10-CM | POA: Diagnosis not present

## 2016-11-14 DIAGNOSIS — D241 Benign neoplasm of right breast: Secondary | ICD-10-CM | POA: Diagnosis not present

## 2016-11-14 DIAGNOSIS — R920 Mammographic microcalcification found on diagnostic imaging of breast: Secondary | ICD-10-CM | POA: Diagnosis not present

## 2016-11-21 DIAGNOSIS — D241 Benign neoplasm of right breast: Secondary | ICD-10-CM | POA: Diagnosis not present

## 2016-11-21 HISTORY — PX: BREAST LUMPECTOMY: SHX2

## 2016-12-10 DIAGNOSIS — R928 Other abnormal and inconclusive findings on diagnostic imaging of breast: Secondary | ICD-10-CM | POA: Diagnosis not present

## 2016-12-10 DIAGNOSIS — F419 Anxiety disorder, unspecified: Secondary | ICD-10-CM | POA: Diagnosis not present

## 2016-12-10 DIAGNOSIS — Z794 Long term (current) use of insulin: Secondary | ICD-10-CM | POA: Diagnosis not present

## 2016-12-10 DIAGNOSIS — E785 Hyperlipidemia, unspecified: Secondary | ICD-10-CM | POA: Diagnosis not present

## 2016-12-10 DIAGNOSIS — E1122 Type 2 diabetes mellitus with diabetic chronic kidney disease: Secondary | ICD-10-CM | POA: Diagnosis not present

## 2016-12-10 DIAGNOSIS — E1136 Type 2 diabetes mellitus with diabetic cataract: Secondary | ICD-10-CM | POA: Diagnosis not present

## 2016-12-10 DIAGNOSIS — Z885 Allergy status to narcotic agent status: Secondary | ICD-10-CM | POA: Diagnosis not present

## 2016-12-10 DIAGNOSIS — N62 Hypertrophy of breast: Secondary | ICD-10-CM | POA: Diagnosis not present

## 2016-12-10 DIAGNOSIS — D519 Vitamin B12 deficiency anemia, unspecified: Secondary | ICD-10-CM | POA: Diagnosis not present

## 2016-12-10 DIAGNOSIS — Z79890 Hormone replacement therapy (postmenopausal): Secondary | ICD-10-CM | POA: Diagnosis not present

## 2016-12-10 DIAGNOSIS — Z79899 Other long term (current) drug therapy: Secondary | ICD-10-CM | POA: Diagnosis not present

## 2016-12-10 DIAGNOSIS — N6002 Solitary cyst of left breast: Secondary | ICD-10-CM | POA: Diagnosis not present

## 2016-12-10 DIAGNOSIS — D241 Benign neoplasm of right breast: Secondary | ICD-10-CM | POA: Diagnosis not present

## 2016-12-10 DIAGNOSIS — F329 Major depressive disorder, single episode, unspecified: Secondary | ICD-10-CM | POA: Diagnosis not present

## 2016-12-10 DIAGNOSIS — E1140 Type 2 diabetes mellitus with diabetic neuropathy, unspecified: Secondary | ICD-10-CM | POA: Diagnosis not present

## 2016-12-10 DIAGNOSIS — E1165 Type 2 diabetes mellitus with hyperglycemia: Secondary | ICD-10-CM | POA: Diagnosis not present

## 2016-12-10 DIAGNOSIS — N183 Chronic kidney disease, stage 3 (moderate): Secondary | ICD-10-CM | POA: Diagnosis not present

## 2016-12-10 DIAGNOSIS — N6081 Other benign mammary dysplasias of right breast: Secondary | ICD-10-CM | POA: Diagnosis not present

## 2016-12-10 DIAGNOSIS — H2513 Age-related nuclear cataract, bilateral: Secondary | ICD-10-CM | POA: Diagnosis not present

## 2016-12-10 DIAGNOSIS — I129 Hypertensive chronic kidney disease with stage 1 through stage 4 chronic kidney disease, or unspecified chronic kidney disease: Secondary | ICD-10-CM | POA: Diagnosis not present

## 2016-12-25 DIAGNOSIS — Z23 Encounter for immunization: Secondary | ICD-10-CM | POA: Diagnosis not present

## 2016-12-29 ENCOUNTER — Other Ambulatory Visit: Payer: Self-pay | Admitting: Nurse Practitioner

## 2017-01-06 NOTE — Progress Notes (Deleted)
[facility]  PATIENT: [name] DOB: 23-Dec-1950   REASON FOR VISIT: Follow-up for tremor HISTORY FROM: Patient and husband [place] ILLNESS:[name], 66 year old female returns for followup with essential tremor.The patient reports her tremor is about the same. The patient has been on Mysoline taking 100 mg in the morning, 150 mg in the evening. The patient also takes alprazolam 1 mg 4 times daily. The patient denies any excessive daytime drowsiness, but she still has mild tremor but this is not bothersome and does not interfere with her ability to do her ADLs.The patient indicates that her father, the paternal grandmother, and several great aunts and uncles had tremor. The patient returns to this office for further evaluation. She has recently gotten off of her diet and has gained back some of the weight  she lost . She does not exercise. She returns for reevaluation   REVIEW OF SYSTEMS: Full 14 system review of systems performed and notable only for those listed, all others are neg:  Constitutional: neg  Cardiovascular: neg Ear/Nose/Throat: neg  Skin: neg Eyes: neg Respiratory: neg Gastroitestinal: neg  Hematology/Lymphatic: neg  Endocrine: neg Musculoskeletal:neg Allergy/Immunology: neg Neurological: Tremor  Psychiatric: Depression and anxiety Sleep : Snoring   ALLERGIES: Allergies  Allergen Reactions  . Codeine   . Demerol   . Hydrochlorothiazide     Nauseated, tremors get worse  . Meperidine Hcl   . Niacin-Lovastatin Er   . Topiramate     HOME MEDICATIONS: Outpatient Medications Prior to Visit  Medication Sig Dispense Refill  . ALPRAZolam (XANAX) 1 MG tablet Take 1 mg by mouth 4 (four) times daily.     Marland Kitchen amLODipine (NORVASC) 5 MG tablet Take 5 mg by mouth daily.   0  . atorvastatin (LIPITOR) 40 MG tablet take 1 tablet by mouth once daily 30 tablet 0  . BD ULTRA-FINE PEN NEEDLES 29G X 12.7MM MISC USE FIVE TIMES  DAILY FOR INSULIN INJECTIONS 400 each 2  . Blood Glucose Monitoring Suppl (ONE TOUCH ULTRA SYSTEM KIT) W/DEVICE KIT Use as instructed to check blood sugars four times a day ( Dx Code 250.020) 1 each 0  . Calcium Carbonate-Vitamin D (CALCIUM 600+D) 600-400 MG-UNIT per tablet Take 1 tablet by mouth daily.      Marland Kitchen estradiol (VIVELLE-DOT) 0.1 MG/24HR Place 1 patch onto the skin 2 (two) times a week.    . ferrous sulfate 325 (65 FE) MG tablet Take 325 mg by mouth daily.      . furosemide (LASIX) 20 MG tablet take 1 tablet by mouth twice a day 30 tablet 2  . gabapentin (NEURONTIN) 300 MG capsule Take 300 mg by mouth 3 (three) times daily.     Marland Kitchen HUMALOG KWIKPEN 100 UNIT/ML SOPN INJECT 30 UNITS BEFORE BREAKFAST, 30 UNITS BEFORE LUNCH, AND 50 UNITS BEFORE DINNER (Patient taking differently: INJECT 60 UNITS BEFORE BREAKFAST, AND 70 UNITS BEFORE DINNER) 3 mL 3  . labetalol (NORMODYNE) 200 MG tablet Take 400 mg by mouth 2 (two) times daily.   0  . LEVEMIR FLEXTOUCH 100 UNIT/ML Pen   0  . mirtazapine (REMERON) 15 MG tablet Take 15 mg by mouth at bedtime.     . Omega-3 Fatty Acids (FISH OIL) 1000 MG CAPS Take 1,200 mg by mouth. Take 4 capsules daily.    . ONE TOUCH ULTRA TEST test strip USE 4 TIMES DAILY TO CHECK BLOOD SUGARS 300 each 3  . PARoxetine (PAXIL) 30 MG  tablet Take 30 mg by mouth 2 (two) times daily.    . primidone (MYSOLINE) 50 MG tablet TAKE 2 TABLETS BY MOUTH EVERY MORNING AND 3 TABLETS AT NIGHT 450 tablet 0  . QUEtiapine (SEROQUEL) 400 MG tablet One half tablet in the morning and one tablet in the evening    . TRULICITY 1.61 WR/6.0AV SOPN Every 7 days (sunday)    . valsartan (DIOVAN) 320 MG tablet Take 320 mg by mouth daily.   0   No facility-administered medications prior to visit.     PAST MEDICAL HISTORY: Past Medical History:  Diagnosis Date  . Allergic rhinitis   . Anxiety   . Benign essential tremor   . CRI (chronic renal insufficiency)   . Depression   . Diabetes mellitus, type 2  ([place])   . Diverticulosis of colon   . Essential and other specified forms of tremor 12/29/2012  . GERD (gastroesophageal reflux disease)   . Hyperlipemia   . IBS (irritable bowel syndrome)   . Iron deficiency anemia   . Morbidly obese ([place])   . Pernicious anemia     PAST SURGICAL HISTORY: Past Surgical History:  Procedure Laterality Date  . ABDOMINAL HYSTERECTOMY    . BILATERAL OOPHORECTOMY     ovarian cysts  . CESAREAN SECTION    . TONSILLECTOMY      FAMILY HISTORY: Family History  Problem Relation Age of Onset  . Heart disease Mother   . Diabetes Father   . Hypertension Father   . Heart disease Sister   . Stroke Sister   . Diabetes Sister   . Heart disease Unknown   . Diabetes Unknown   . Colitis Unknown        ulcerative    SOCIAL HISTORY: Social History   Socioeconomic History  . Marital status: Married    Spouse name: Raeford   . Number of children: 2  . Years of education: 61  . Highest education level: Not on file  Social Needs  . Financial resource strain: Not on file  . Food insecurity - worry: Not on file  . Food insecurity - inability: Not on file  . Transportation needs - medical: Not on file  . Transportation needs - non-medical: Not on file  Occupational History    Employer: UNEMPLOYED  Tobacco Use  . Smoking status: Never Smoker  . Smokeless tobacco: Never Used  Substance and Sexual Activity  . Alcohol use: No  . Drug use: Not on file  . Sexual activity: Not on file  Other Topics Concern  . Not on file  Social History Narrative   Patient lives at home with husband Raeford.   Patient has 2 children. One past away last year.    Patient is currently not working.    Patient has 2 years of college.    Patient is right handed.               PHYSICAL EXAM  There were no vitals filed for this visit. There is no height or weight on file to calculate BMI. Generalized: Well developed, morbidly obese female in no acute distress  Head:  normocephalic and atraumatic,. Oropharynx benign  Neck: Supple, no carotid bruits  Cardiac: Regular rate rhythm, no murmur  Musculoskeletal: No deformity   Neurological examination   Mentation: Alert oriented to time, place, history taking. Follows all commands speech and language fluent  Cranial nerve II-XII: Pupils were equal round reactive to light extraocular movements were full, visual  field were full on confrontational test. Facial sensation and strength were normal. hearing was intact to finger rubbing bilaterally. Uvula tongue midline. head turning and shoulder shrug were normal and symmetric.Tongue protrusion into cheek strength was normal. Motor: normal bulk and tone, full strength in the BUE, BLE, fine finger movements normal, no pronator drift. No focal weakness. Mild head tremor noted Coordination: finger-nose-finger, heel-to-shin bilaterally, no dysmetria, intention tremor with finger to nose finger bilaterally Reflexes: Brachioradialis 2/2, biceps 2/2, triceps 2/2, patellar 2/2, Achilles 2/2, plantar responses were flexor bilaterally. Gait and Station: Rising up from seated position without assistance, normal stance, moderate stride, good arm swing, smooth turning, able to perform tiptoe, and heel walking without difficulty. Tandem gait is steady DIAGNOSTIC DATA (LABS, IMAGING, TESTING)   ASSESSMENT AND PLAN  66 y.o. year old female  has a past medical history of  Diabetes mellitus, type 2 ([place]); Hyperlipemia; Anxiety; Depression; ; Benign essential tremor;  Morbidly obese ([place]). here to follow up for tremor.  Continue primidone at current dose will refill Walk for exercise, at least 30 min every day this will be good for your anxiety as well as your  diabetes and B/P  Join weight watchers for slow steady weight loss Given copy of Mediterranean diet  Continue follow-up with psychiatry for depression   Follow-up in 6 months I spent 25 minutes in total face to face time  with the patient more than 50% of which was spent counseling and coordination of care, reviewing test results reviewing medications and discussing and reviewing the diagnosis of tremor and also her compulsive eating. She needs a rigid schedule and husband  is agreeable to help. Discussed portion control and reviewed Mediterranean diet. May need sleep study Dennie Bible, [facility], [facility], APRN  [facility] [address], [place], [place] [address] [phone]

## 2017-01-07 ENCOUNTER — Ambulatory Visit: Payer: PPO | Admitting: Nurse Practitioner

## 2017-03-07 DIAGNOSIS — M799 Soft tissue disorder, unspecified: Secondary | ICD-10-CM | POA: Diagnosis not present

## 2017-03-07 DIAGNOSIS — N183 Chronic kidney disease, stage 3 (moderate): Secondary | ICD-10-CM | POA: Diagnosis not present

## 2017-03-07 DIAGNOSIS — E785 Hyperlipidemia, unspecified: Secondary | ICD-10-CM | POA: Diagnosis not present

## 2017-03-07 DIAGNOSIS — I129 Hypertensive chronic kidney disease with stage 1 through stage 4 chronic kidney disease, or unspecified chronic kidney disease: Secondary | ICD-10-CM | POA: Diagnosis not present

## 2017-03-31 ENCOUNTER — Other Ambulatory Visit: Payer: Self-pay | Admitting: Nurse Practitioner

## 2017-04-01 NOTE — Progress Notes (Deleted)
[facility]  PATIENT: [name] DOB: 23-Dec-1950   REASON FOR VISIT: Follow-up for tremor HISTORY FROM: Patient and husband [place] ILLNESS:[name], 67 year old female returns for followup with essential tremor.The patient reports her tremor is about the same. The patient has been on Mysoline taking 100 mg in the morning, 150 mg in the evening. The patient also takes alprazolam 1 mg 4 times daily. The patient denies any excessive daytime drowsiness, but she still has mild tremor but this is not bothersome and does not interfere with her ability to do her ADLs.The patient indicates that her father, the paternal grandmother, and several great aunts and uncles had tremor. The patient returns to this office for further evaluation. She has recently gotten off of her diet and has gained back some of the weight  she lost . She does not exercise. She returns for reevaluation   REVIEW OF SYSTEMS: Full 14 system review of systems performed and notable only for those listed, all others are neg:  Constitutional: neg  Cardiovascular: neg Ear/Nose/Throat: neg  Skin: neg Eyes: neg Respiratory: neg Gastroitestinal: neg  Hematology/Lymphatic: neg  Endocrine: neg Musculoskeletal:neg Allergy/Immunology: neg Neurological: Tremor  Psychiatric: Depression and anxiety Sleep : Snoring   ALLERGIES: Allergies  Allergen Reactions  . Codeine   . Demerol   . Hydrochlorothiazide     Nauseated, tremors get worse  . Meperidine Hcl   . Niacin-Lovastatin Er   . Topiramate     HOME MEDICATIONS: Outpatient Medications Prior to Visit  Medication Sig Dispense Refill  . ALPRAZolam (XANAX) 1 MG tablet Take 1 mg by mouth 4 (four) times daily.     Marland Kitchen amLODipine (NORVASC) 5 MG tablet Take 5 mg by mouth daily.   0  . atorvastatin (LIPITOR) 40 MG tablet take 1 tablet by mouth once daily 30 tablet 0  . BD ULTRA-FINE PEN NEEDLES 29G X 12.7MM MISC USE FIVE TIMES  DAILY FOR INSULIN INJECTIONS 400 each 2  . Blood Glucose Monitoring Suppl (ONE TOUCH ULTRA SYSTEM KIT) W/DEVICE KIT Use as instructed to check blood sugars four times a day ( Dx Code 250.020) 1 each 0  . Calcium Carbonate-Vitamin D (CALCIUM 600+D) 600-400 MG-UNIT per tablet Take 1 tablet by mouth daily.      Marland Kitchen estradiol (VIVELLE-DOT) 0.1 MG/24HR Place 1 patch onto the skin 2 (two) times a week.    . ferrous sulfate 325 (65 FE) MG tablet Take 325 mg by mouth daily.      . furosemide (LASIX) 20 MG tablet take 1 tablet by mouth twice a day 30 tablet 2  . gabapentin (NEURONTIN) 300 MG capsule Take 300 mg by mouth 3 (three) times daily.     Marland Kitchen HUMALOG KWIKPEN 100 UNIT/ML SOPN INJECT 30 UNITS BEFORE BREAKFAST, 30 UNITS BEFORE LUNCH, AND 50 UNITS BEFORE DINNER (Patient taking differently: INJECT 60 UNITS BEFORE BREAKFAST, AND 70 UNITS BEFORE DINNER) 3 mL 3  . labetalol (NORMODYNE) 200 MG tablet Take 400 mg by mouth 2 (two) times daily.   0  . LEVEMIR FLEXTOUCH 100 UNIT/ML Pen   0  . mirtazapine (REMERON) 15 MG tablet Take 15 mg by mouth at bedtime.     . Omega-3 Fatty Acids (FISH OIL) 1000 MG CAPS Take 1,200 mg by mouth. Take 4 capsules daily.    . ONE TOUCH ULTRA TEST test strip USE 4 TIMES DAILY TO CHECK BLOOD SUGARS 300 each 3  . PARoxetine (PAXIL) 30 MG  tablet Take 30 mg by mouth 2 (two) times daily.    . primidone (MYSOLINE) 50 MG tablet TAKE 2 TABLETS BY MOUTH EVERY MORNING AND 3 TABLETS AT NIGHT 450 tablet 0  . QUEtiapine (SEROQUEL) 400 MG tablet One half tablet in the morning and one tablet in the evening    . TRULICITY 1.61 WR/6.0AV SOPN Every 7 days (sunday)    . valsartan (DIOVAN) 320 MG tablet Take 320 mg by mouth daily.   0   No facility-administered medications prior to visit.     PAST MEDICAL HISTORY: Past Medical History:  Diagnosis Date  . Allergic rhinitis   . Anxiety   . Benign essential tremor   . CRI (chronic renal insufficiency)   . Depression   . Diabetes mellitus, type 2  ([place])   . Diverticulosis of colon   . Essential and other specified forms of tremor 12/29/2012  . GERD (gastroesophageal reflux disease)   . Hyperlipemia   . IBS (irritable bowel syndrome)   . Iron deficiency anemia   . Morbidly obese ([place])   . Pernicious anemia     PAST SURGICAL HISTORY: Past Surgical History:  Procedure Laterality Date  . ABDOMINAL HYSTERECTOMY    . BILATERAL OOPHORECTOMY     ovarian cysts  . CESAREAN SECTION    . TONSILLECTOMY      FAMILY HISTORY: Family History  Problem Relation Age of Onset  . Heart disease Mother   . Diabetes Father   . Hypertension Father   . Heart disease Sister   . Stroke Sister   . Diabetes Sister   . Heart disease Unknown   . Diabetes Unknown   . Colitis Unknown        ulcerative    SOCIAL HISTORY: Social History   Socioeconomic History  . Marital status: Married    Spouse name: Raeford   . Number of children: 2  . Years of education: 61  . Highest education level: Not on file  Social Needs  . Financial resource strain: Not on file  . Food insecurity - worry: Not on file  . Food insecurity - inability: Not on file  . Transportation needs - medical: Not on file  . Transportation needs - non-medical: Not on file  Occupational History    Employer: UNEMPLOYED  Tobacco Use  . Smoking status: Never Smoker  . Smokeless tobacco: Never Used  Substance and Sexual Activity  . Alcohol use: No  . Drug use: Not on file  . Sexual activity: Not on file  Other Topics Concern  . Not on file  Social History Narrative   Patient lives at home with husband Raeford.   Patient has 2 children. One past away last year.    Patient is currently not working.    Patient has 2 years of college.    Patient is right handed.               PHYSICAL EXAM  There were no vitals filed for this visit. There is no height or weight on file to calculate BMI. Generalized: Well developed, morbidly obese female in no acute distress  Head:  normocephalic and atraumatic,. Oropharynx benign  Neck: Supple, no carotid bruits  Cardiac: Regular rate rhythm, no murmur  Musculoskeletal: No deformity   Neurological examination   Mentation: Alert oriented to time, place, history taking. Follows all commands speech and language fluent  Cranial nerve II-XII: Pupils were equal round reactive to light extraocular movements were full, visual  field were full on confrontational test. Facial sensation and strength were normal. hearing was intact to finger rubbing bilaterally. Uvula tongue midline. head turning and shoulder shrug were normal and symmetric.Tongue protrusion into cheek strength was normal. Motor: normal bulk and tone, full strength in the BUE, BLE, fine finger movements normal, no pronator drift. No focal weakness. Mild head tremor noted Coordination: finger-nose-finger, heel-to-shin bilaterally, no dysmetria, intention tremor with finger to nose finger bilaterally Reflexes: Brachioradialis 2/2, biceps 2/2, triceps 2/2, patellar 2/2, Achilles 2/2, plantar responses were flexor bilaterally. Gait and Station: Rising up from seated position without assistance, normal stance, moderate stride, good arm swing, smooth turning, able to perform tiptoe, and heel walking without difficulty. Tandem gait is steady DIAGNOSTIC DATA (LABS, IMAGING, TESTING)   ASSESSMENT AND PLAN  67 y.o. year old female  has a past medical history of  Diabetes mellitus, type 2 ([place]); Hyperlipemia; Anxiety; Depression; ; Benign essential tremor;  Morbidly obese ([place]). here to follow up for tremor.  Continue primidone at current dose will refill Walk for exercise, at least 30 min every day this will be good for your anxiety as well as your  diabetes and B/P  Join weight watchers for slow steady weight loss Given copy of Mediterranean diet  Continue follow-up with psychiatry for depression   Follow-up in 6 months I spent 25 minutes in total face to face time  with the patient more than 50% of which was spent counseling and coordination of care, reviewing test results reviewing medications and discussing and reviewing the diagnosis of tremor and also her compulsive eating. She needs a rigid schedule and husband  is agreeable to help. Discussed portion control and reviewed Mediterranean diet. May need sleep study Dennie Bible, [facility], [facility], APRN  [facility] [address], [place], [place] [address] [phone]

## 2017-04-02 ENCOUNTER — Ambulatory Visit: Payer: PPO | Admitting: Nurse Practitioner

## 2017-04-03 ENCOUNTER — Encounter: Payer: Self-pay | Admitting: Nurse Practitioner

## 2017-04-03 DIAGNOSIS — N183 Chronic kidney disease, stage 3 (moderate): Secondary | ICD-10-CM | POA: Diagnosis not present

## 2017-04-03 DIAGNOSIS — E1122 Type 2 diabetes mellitus with diabetic chronic kidney disease: Secondary | ICD-10-CM | POA: Diagnosis not present

## 2017-04-03 DIAGNOSIS — R3129 Other microscopic hematuria: Secondary | ICD-10-CM | POA: Diagnosis not present

## 2017-04-03 DIAGNOSIS — R801 Persistent proteinuria, unspecified: Secondary | ICD-10-CM | POA: Diagnosis not present

## 2017-04-05 DIAGNOSIS — K047 Periapical abscess without sinus: Secondary | ICD-10-CM | POA: Diagnosis not present

## 2017-04-05 DIAGNOSIS — J0140 Acute pansinusitis, unspecified: Secondary | ICD-10-CM | POA: Diagnosis not present

## 2017-04-22 DIAGNOSIS — N281 Cyst of kidney, acquired: Secondary | ICD-10-CM | POA: Diagnosis not present

## 2017-04-22 DIAGNOSIS — N183 Chronic kidney disease, stage 3 (moderate): Secondary | ICD-10-CM | POA: Diagnosis not present

## 2017-05-22 DIAGNOSIS — I129 Hypertensive chronic kidney disease with stage 1 through stage 4 chronic kidney disease, or unspecified chronic kidney disease: Secondary | ICD-10-CM | POA: Diagnosis not present

## 2017-05-22 DIAGNOSIS — Z794 Long term (current) use of insulin: Secondary | ICD-10-CM | POA: Diagnosis not present

## 2017-05-22 DIAGNOSIS — E785 Hyperlipidemia, unspecified: Secondary | ICD-10-CM | POA: Diagnosis not present

## 2017-05-22 DIAGNOSIS — E1122 Type 2 diabetes mellitus with diabetic chronic kidney disease: Secondary | ICD-10-CM | POA: Diagnosis not present

## 2017-05-22 DIAGNOSIS — N189 Chronic kidney disease, unspecified: Secondary | ICD-10-CM | POA: Diagnosis not present

## 2017-06-10 NOTE — Progress Notes (Signed)
[facility]  PATIENT: [name] DOB: [birth year]   REASON FOR VISIT: Follow-up for tremor HISTORY FROM: Patient and husband [place] ILLNESS:[name], 67 year old female returns for followup with essential tremor.The patient reports her tremor is about the same. The patient has been on Mysoline taking 100 mg in the morning, 150 mg in the evening. The patient also takes alprazolam 1 mg 4 times daily. The patient denies any excessive daytime drowsiness, but she still has mild tremor but this is not bothersome and does not interfere with her ability to do her ADLs.The patient indicates that her father, the paternal grandmother, and several great aunts and uncles had tremor. The patient returns to this office for further evaluation. She has recently joined [employer] and has lost about 10 pounds. She does not exercise. She returns for reevaluation   REVIEW OF SYSTEMS: Full 14 system review of systems performed and notable only for those listed, all others are neg:  Constitutional: neg  Cardiovascular: neg Ear/Nose/Throat: neg  Skin: neg Eyes: neg Respiratory: neg Gastroitestinal: neg  Hematology/Lymphatic: neg  Endocrine: neg Musculoskeletal:neg Allergy/Immunology: neg Neurological: Tremor  Psychiatric: Depression and anxiety treatment by psych Sleep : Snoring   ALLERGIES: Allergies  Allergen Reactions  . Codeine   . Demerol   . Hydrochlorothiazide     Nauseated, tremors get worse  . Meperidine Hcl   . Niacin-Lovastatin Er     "makes me feel not real'  . Topiramate     HOME MEDICATIONS: Outpatient Medications Prior to Visit  Medication Sig Dispense Refill  . ALPRAZolam (XANAX) 1 MG tablet Take 1 mg by mouth 4 (four) times daily.     Marland Kitchen amLODipine (NORVASC) 5 MG tablet Take 5 mg by mouth daily.   0  . atorvastatin (LIPITOR) 40 MG tablet take 1 tablet by mouth once daily 30 tablet 0  . BD ULTRA-FINE PEN NEEDLES 29G X  12.7MM MISC USE FIVE TIMES DAILY FOR INSULIN INJECTIONS 400 each 2  . Blood Glucose Monitoring Suppl (ONE TOUCH ULTRA SYSTEM KIT) W/DEVICE KIT Use as instructed to check blood sugars four times a day ( Dx Code 250.020) 1 each 0  . Calcium Carbonate-Vitamin D (CALCIUM 600+D) 600-400 MG-UNIT per tablet Take 1 tablet by mouth daily.      Marland Kitchen estradiol (VIVELLE-DOT) 0.1 MG/24HR Place 1 patch onto the skin 2 (two) times a week.    . ferrous sulfate 325 (65 FE) MG tablet Take 325 mg by mouth daily.      . furosemide (LASIX) 20 MG tablet take 1 tablet by mouth twice a day 30 tablet 2  . gabapentin (NEURONTIN) 300 MG capsule Take 300 mg by mouth 3 (three) times daily.     Marland Kitchen HUMALOG KWIKPEN 100 UNIT/ML SOPN INJECT 30 UNITS BEFORE BREAKFAST, 30 UNITS BEFORE LUNCH, AND 50 UNITS BEFORE DINNER (Patient taking differently: INJECT 60 UNITS BEFORE BREAKFAST, AND 70 UNITS BEFORE DINNER) 3 mL 3  . labetalol (NORMODYNE) 200 MG tablet Take 400 mg by mouth 2 (two) times daily.   0  . LEVEMIR FLEXTOUCH 100 UNIT/ML Pen   0  . mirtazapine (REMERON) 15 MG tablet Take 15 mg by mouth at bedtime.     . Omega-3 Fatty Acids (FISH OIL) 1000 MG CAPS Take 1,200 mg by mouth. Take 4 capsules daily.    . ONE TOUCH ULTRA TEST test strip USE 4 TIMES DAILY TO CHECK BLOOD SUGARS 300 each 3  . PARoxetine (  PAXIL) 30 MG tablet Take 30 mg by mouth 2 (two) times daily.    . primidone (MYSOLINE) 50 MG tablet TAKE 2 TABLETS BY MOUTH EVERY MORNING AND 3 TABLETS AT NIGHT 450 tablet 0  . QUEtiapine (SEROQUEL) 400 MG tablet One half tablet in the morning and one tablet in the evening    . TRULICITY 8.52 DP/8.2UM SOPN Every 7 days (sunday)    . valsartan (DIOVAN) 320 MG tablet Take 320 mg by mouth daily.   0   No facility-administered medications prior to visit.     PAST MEDICAL HISTORY: Past Medical History:  Diagnosis Date  . Allergic rhinitis   . Anxiety   . Benign essential tremor   . CRI (chronic renal insufficiency)   . Depression     . Diabetes mellitus, type 2 ([place])   . Diverticulosis of colon   . Essential and other specified forms of tremor 12/29/2012  . GERD (gastroesophageal reflux disease)   . Hyperlipemia   . IBS (irritable bowel syndrome)   . Iron deficiency anemia   . Morbidly obese ([place])   . Pernicious anemia     PAST SURGICAL HISTORY: Past Surgical History:  Procedure Laterality Date  . ABDOMINAL HYSTERECTOMY    . BILATERAL OOPHORECTOMY     ovarian cysts  . BREAST LUMPECTOMY Right 11/2016  . CESAREAN SECTION    . polyps removed     colonoscopy  . TONSILLECTOMY      FAMILY HISTORY: Family History  Problem Relation Age of Onset  . Heart disease Mother   . Diabetes Father   . Hypertension Father   . Heart disease Sister   . Stroke Sister   . Diabetes Sister   . Heart disease Unknown   . Diabetes Unknown   . Colitis Unknown        ulcerative    SOCIAL HISTORY: Social History   Socioeconomic History  . Marital status: Married    Spouse name: Raeford   . Number of children: 2  . Years of education: 70  . Highest education level: Not on file  Occupational History    Employer: UNEMPLOYED  Social Needs  . Financial resource strain: Not on file  . Food insecurity:    Worry: Not on file    Inability: Not on file  . Transportation needs:    Medical: Not on file    Non-medical: Not on file  Tobacco Use  . Smoking status: Never Smoker  . Smokeless tobacco: Never Used  Substance and Sexual Activity  . Alcohol use: No  . Drug use: Not on file  . Sexual activity: Not on file  Lifestyle  . Physical activity:    Days per week: Not on file    Minutes per session: Not on file  . Stress: Not on file  Relationships  . Social connections:    Talks on phone: Not on file    Gets together: Not on file    Attends religious service: Not on file    Active member of club or organization: Not on file    Attends meetings of clubs or organizations: Not on file    Relationship status: Not on  file  . Intimate partner violence:    Fear of current or ex partner: Not on file    Emotionally abused: Not on file    Physically abused: Not on file    Forced sexual activity: Not on file  Other Topics Concern  . Not on file  Social History Narrative   Patient lives at home with husband Raeford.   Patient has 2 children. One past away last year.    Patient is currently not working.    Patient has 2 years of college.    Patient is right handed.               PHYSICAL EXAM  Vitals:   06/11/17 1432  BP: (!) 165/84  Pulse: 71  Weight: 273 lb 3.2 oz (123.9 kg)  Height: '5\' 4"'$  (1.626 m)   Body mass index is 46.89 kg/m. Generalized: Well developed, morbidly obese female in no acute distress  Head: normocephalic and atraumatic,. Oropharynx benign  Neck: Supple, no carotid bruits  Cardiac: Regular rate rhythm, no murmur  Musculoskeletal: No deformity   Neurological examination   Mentation: Alert oriented to time, place, history taking. Follows all commands speech and language fluent  Cranial nerve II-XII: Pupils were equal round reactive to light extraocular movements were full, visual field were full on confrontational test. Facial sensation and strength were normal. hearing was intact to finger rubbing bilaterally. Uvula tongue midline. head turning and shoulder shrug were normal and symmetric.Tongue protrusion into cheek strength was normal. Motor: normal bulk and tone, full strength in the BUE, BLE, fine finger movements normal, no pronator drift. No focal weakness. Mild head tremor noted Coordination: finger-nose-finger, heel-to-shin bilaterally, no dysmetria, intention tremor with finger to nose finger bilaterally which is mild Reflexes: Symmetric upper and lower plantar responses were flexor bilaterally. Gait and Station: Rising up from seated position without assistance, normal stance, moderate stride, good arm swing, smooth turning, able to perform tiptoe, and heel  walking without difficulty. Tandem gait is unsteady DIAGNOSTIC DATA (LABS, IMAGING, TESTING)   ASSESSMENT AND PLAN  67 y.o. year old female  has a past medical history of  Diabetes mellitus, type 2 ([place]); Hyperlipemia; Anxiety; Depression; ; Benign essential tremor;  Morbidly obese ([place]). here to follow up for tremor.  Continue primidone at current dose will refill Walk for exercise, at least 30 min every day this will be good for your anxiety as well as your  diabetes and B/P  Continue  weight watchers for slow steady weight loss Continue follow-up with psychiatry for depression   Follow-up yearly I spent 20 minutes in total face to face time with the patient more than 50% of which was spent counseling and coordination of care, reviewing test results reviewing medications and discussing and reviewing the diagnosis of tremor and also her compulsive eating. She needs a rigid schedule and husband  is agreeable to help. Discussed portion control . Dennie Bible, [facility], [facility], APRN  [facility] [address], [place], [place] [address] [phone]

## 2017-06-11 ENCOUNTER — Encounter: Payer: Self-pay | Admitting: Nurse Practitioner

## 2017-06-11 ENCOUNTER — Ambulatory Visit (INDEPENDENT_AMBULATORY_CARE_PROVIDER_SITE_OTHER): Payer: PPO | Admitting: Nurse Practitioner

## 2017-06-11 VITALS — BP 165/84 | HR 71 | Ht 64.0 in | Wt 273.2 lb

## 2017-06-11 DIAGNOSIS — G250 Essential tremor: Secondary | ICD-10-CM

## 2017-06-11 MED ORDER — PRIMIDONE 50 MG PO TABS: ORAL_TABLET | ORAL | 3 refills | Status: DC

## 2017-06-11 NOTE — Patient Instructions (Signed)
Continue primidone at current dose will refill Walk for exercise, at least 30 min every day this will be good for your anxiety as well as your  diabetes and B/P  Continue  weight watchers for slow steady weight loss Continue follow-up with psychiatry for depression   Follow-up yearly

## 2017-06-11 NOTE — Progress Notes (Signed)
I have read the note, and I agree with the clinical assessment and plan.  Kamiah Fite K Ellicia Alix   

## 2017-06-18 DIAGNOSIS — D241 Benign neoplasm of right breast: Secondary | ICD-10-CM | POA: Diagnosis not present

## 2017-06-18 DIAGNOSIS — R922 Inconclusive mammogram: Secondary | ICD-10-CM | POA: Diagnosis not present

## 2017-06-18 DIAGNOSIS — Z9889 Other specified postprocedural states: Secondary | ICD-10-CM | POA: Diagnosis not present

## 2017-06-19 DIAGNOSIS — Z9889 Other specified postprocedural states: Secondary | ICD-10-CM | POA: Diagnosis not present

## 2017-06-30 DIAGNOSIS — Z01419 Encounter for gynecological examination (general) (routine) without abnormal findings: Secondary | ICD-10-CM | POA: Diagnosis not present

## 2017-07-09 DIAGNOSIS — N183 Chronic kidney disease, stage 3 (moderate): Secondary | ICD-10-CM | POA: Diagnosis not present

## 2017-07-16 DIAGNOSIS — I129 Hypertensive chronic kidney disease with stage 1 through stage 4 chronic kidney disease, or unspecified chronic kidney disease: Secondary | ICD-10-CM | POA: Diagnosis not present

## 2017-07-16 DIAGNOSIS — R801 Persistent proteinuria, unspecified: Secondary | ICD-10-CM | POA: Diagnosis not present

## 2017-07-16 DIAGNOSIS — E1122 Type 2 diabetes mellitus with diabetic chronic kidney disease: Secondary | ICD-10-CM | POA: Diagnosis not present

## 2017-07-16 DIAGNOSIS — E559 Vitamin D deficiency, unspecified: Secondary | ICD-10-CM | POA: Diagnosis not present

## 2017-09-10 DIAGNOSIS — E785 Hyperlipidemia, unspecified: Secondary | ICD-10-CM | POA: Diagnosis not present

## 2017-09-10 DIAGNOSIS — Z0000 Encounter for general adult medical examination without abnormal findings: Secondary | ICD-10-CM | POA: Diagnosis not present

## 2017-09-10 DIAGNOSIS — I129 Hypertensive chronic kidney disease with stage 1 through stage 4 chronic kidney disease, or unspecified chronic kidney disease: Secondary | ICD-10-CM | POA: Diagnosis not present

## 2017-09-10 DIAGNOSIS — N183 Chronic kidney disease, stage 3 (moderate): Secondary | ICD-10-CM | POA: Diagnosis not present

## 2017-09-10 DIAGNOSIS — Z23 Encounter for immunization: Secondary | ICD-10-CM | POA: Diagnosis not present

## 2017-09-11 DIAGNOSIS — Z0000 Encounter for general adult medical examination without abnormal findings: Secondary | ICD-10-CM | POA: Diagnosis not present

## 2017-09-11 DIAGNOSIS — I129 Hypertensive chronic kidney disease with stage 1 through stage 4 chronic kidney disease, or unspecified chronic kidney disease: Secondary | ICD-10-CM | POA: Diagnosis not present

## 2017-09-11 DIAGNOSIS — N183 Chronic kidney disease, stage 3 (moderate): Secondary | ICD-10-CM | POA: Diagnosis not present

## 2017-09-11 DIAGNOSIS — E785 Hyperlipidemia, unspecified: Secondary | ICD-10-CM | POA: Diagnosis not present

## 2017-09-11 DIAGNOSIS — Z23 Encounter for immunization: Secondary | ICD-10-CM | POA: Diagnosis not present

## 2017-09-23 DIAGNOSIS — N183 Chronic kidney disease, stage 3 (moderate): Secondary | ICD-10-CM | POA: Diagnosis not present

## 2017-09-23 DIAGNOSIS — I129 Hypertensive chronic kidney disease with stage 1 through stage 4 chronic kidney disease, or unspecified chronic kidney disease: Secondary | ICD-10-CM | POA: Diagnosis not present

## 2017-09-23 DIAGNOSIS — E1122 Type 2 diabetes mellitus with diabetic chronic kidney disease: Secondary | ICD-10-CM | POA: Diagnosis not present

## 2017-09-23 DIAGNOSIS — Z794 Long term (current) use of insulin: Secondary | ICD-10-CM | POA: Diagnosis not present

## 2017-09-23 DIAGNOSIS — E785 Hyperlipidemia, unspecified: Secondary | ICD-10-CM | POA: Diagnosis not present

## 2017-10-08 DIAGNOSIS — E785 Hyperlipidemia, unspecified: Secondary | ICD-10-CM | POA: Diagnosis not present

## 2017-10-08 DIAGNOSIS — I129 Hypertensive chronic kidney disease with stage 1 through stage 4 chronic kidney disease, or unspecified chronic kidney disease: Secondary | ICD-10-CM | POA: Diagnosis not present

## 2017-10-08 DIAGNOSIS — N183 Chronic kidney disease, stage 3 (moderate): Secondary | ICD-10-CM | POA: Diagnosis not present

## 2017-10-08 DIAGNOSIS — M9080 Osteopathy in diseases classified elsewhere, unspecified site: Secondary | ICD-10-CM | POA: Diagnosis not present

## 2017-10-08 DIAGNOSIS — E559 Vitamin D deficiency, unspecified: Secondary | ICD-10-CM | POA: Diagnosis not present

## 2017-10-08 DIAGNOSIS — E889 Metabolic disorder, unspecified: Secondary | ICD-10-CM | POA: Diagnosis not present

## 2017-10-16 DIAGNOSIS — M9080 Osteopathy in diseases classified elsewhere, unspecified site: Secondary | ICD-10-CM | POA: Diagnosis not present

## 2017-10-16 DIAGNOSIS — E559 Vitamin D deficiency, unspecified: Secondary | ICD-10-CM | POA: Diagnosis not present

## 2017-10-16 DIAGNOSIS — R801 Persistent proteinuria, unspecified: Secondary | ICD-10-CM | POA: Diagnosis not present

## 2017-10-16 DIAGNOSIS — E1122 Type 2 diabetes mellitus with diabetic chronic kidney disease: Secondary | ICD-10-CM | POA: Diagnosis not present

## 2017-10-16 DIAGNOSIS — E889 Metabolic disorder, unspecified: Secondary | ICD-10-CM | POA: Diagnosis not present

## 2017-10-16 DIAGNOSIS — I129 Hypertensive chronic kidney disease with stage 1 through stage 4 chronic kidney disease, or unspecified chronic kidney disease: Secondary | ICD-10-CM | POA: Diagnosis not present

## 2017-10-16 DIAGNOSIS — N183 Chronic kidney disease, stage 3 (moderate): Secondary | ICD-10-CM | POA: Diagnosis not present

## 2017-11-05 DIAGNOSIS — Z86018 Personal history of other benign neoplasm: Secondary | ICD-10-CM | POA: Diagnosis not present

## 2017-11-05 DIAGNOSIS — Z1231 Encounter for screening mammogram for malignant neoplasm of breast: Secondary | ICD-10-CM | POA: Diagnosis not present

## 2018-01-07 DIAGNOSIS — Z23 Encounter for immunization: Secondary | ICD-10-CM | POA: Diagnosis not present

## 2018-01-16 ENCOUNTER — Encounter: Payer: Self-pay | Admitting: Nurse Practitioner

## 2018-02-05 DIAGNOSIS — E119 Type 2 diabetes mellitus without complications: Secondary | ICD-10-CM | POA: Diagnosis not present

## 2018-02-05 DIAGNOSIS — H2513 Age-related nuclear cataract, bilateral: Secondary | ICD-10-CM | POA: Diagnosis not present

## 2018-02-05 DIAGNOSIS — H5203 Hypermetropia, bilateral: Secondary | ICD-10-CM | POA: Diagnosis not present

## 2018-02-05 DIAGNOSIS — H52201 Unspecified astigmatism, right eye: Secondary | ICD-10-CM | POA: Diagnosis not present

## 2018-02-05 DIAGNOSIS — H02831 Dermatochalasis of right upper eyelid: Secondary | ICD-10-CM | POA: Diagnosis not present

## 2018-02-05 DIAGNOSIS — H524 Presbyopia: Secondary | ICD-10-CM | POA: Diagnosis not present

## 2018-02-05 DIAGNOSIS — H02834 Dermatochalasis of left upper eyelid: Secondary | ICD-10-CM | POA: Diagnosis not present

## 2018-02-05 DIAGNOSIS — Z794 Long term (current) use of insulin: Secondary | ICD-10-CM | POA: Diagnosis not present

## 2018-02-05 DIAGNOSIS — H25013 Cortical age-related cataract, bilateral: Secondary | ICD-10-CM | POA: Diagnosis not present

## 2018-02-12 DIAGNOSIS — E1122 Type 2 diabetes mellitus with diabetic chronic kidney disease: Secondary | ICD-10-CM | POA: Diagnosis not present

## 2018-02-12 DIAGNOSIS — I129 Hypertensive chronic kidney disease with stage 1 through stage 4 chronic kidney disease, or unspecified chronic kidney disease: Secondary | ICD-10-CM | POA: Diagnosis not present

## 2018-02-12 DIAGNOSIS — Z794 Long term (current) use of insulin: Secondary | ICD-10-CM | POA: Diagnosis not present

## 2018-02-12 DIAGNOSIS — E785 Hyperlipidemia, unspecified: Secondary | ICD-10-CM | POA: Diagnosis not present

## 2018-02-12 DIAGNOSIS — N183 Chronic kidney disease, stage 3 (moderate): Secondary | ICD-10-CM | POA: Diagnosis not present

## 2018-03-10 DIAGNOSIS — I10 Essential (primary) hypertension: Secondary | ICD-10-CM | POA: Diagnosis not present

## 2018-03-10 DIAGNOSIS — E785 Hyperlipidemia, unspecified: Secondary | ICD-10-CM | POA: Diagnosis not present

## 2018-03-11 DIAGNOSIS — I129 Hypertensive chronic kidney disease with stage 1 through stage 4 chronic kidney disease, or unspecified chronic kidney disease: Secondary | ICD-10-CM | POA: Diagnosis not present

## 2018-03-11 DIAGNOSIS — M9080 Osteopathy in diseases classified elsewhere, unspecified site: Secondary | ICD-10-CM | POA: Diagnosis not present

## 2018-03-11 DIAGNOSIS — N183 Chronic kidney disease, stage 3 (moderate): Secondary | ICD-10-CM | POA: Diagnosis not present

## 2018-03-11 DIAGNOSIS — E889 Metabolic disorder, unspecified: Secondary | ICD-10-CM | POA: Diagnosis not present

## 2018-03-11 DIAGNOSIS — E785 Hyperlipidemia, unspecified: Secondary | ICD-10-CM | POA: Diagnosis not present

## 2018-03-11 DIAGNOSIS — E559 Vitamin D deficiency, unspecified: Secondary | ICD-10-CM | POA: Diagnosis not present

## 2018-03-11 DIAGNOSIS — R801 Persistent proteinuria, unspecified: Secondary | ICD-10-CM | POA: Diagnosis not present

## 2018-03-11 DIAGNOSIS — E782 Mixed hyperlipidemia: Secondary | ICD-10-CM | POA: Diagnosis not present

## 2018-03-11 DIAGNOSIS — E1122 Type 2 diabetes mellitus with diabetic chronic kidney disease: Secondary | ICD-10-CM | POA: Diagnosis not present

## 2018-06-02 ENCOUNTER — Ambulatory Visit (INDEPENDENT_AMBULATORY_CARE_PROVIDER_SITE_OTHER): Payer: PPO | Admitting: Neurology

## 2018-06-02 ENCOUNTER — Other Ambulatory Visit: Payer: Self-pay

## 2018-06-02 ENCOUNTER — Encounter: Payer: Self-pay | Admitting: Neurology

## 2018-06-02 DIAGNOSIS — G250 Essential tremor: Secondary | ICD-10-CM

## 2018-06-02 MED ORDER — PRIMIDONE 50 MG PO TABS: ORAL_TABLET | ORAL | 1 refills | Status: DC

## 2018-06-02 NOTE — Progress Notes (Signed)
I have read the note, and I agree with the clinical assessment and plan.  Charles K Willis   

## 2018-06-02 NOTE — Progress Notes (Signed)
Virtual Visit via Video Note  I connected with [name] on 06/02/18 at  2:15 PM EDT by a video enabled telemedicine application and verified that I am speaking with the correct person using two identifiers.  Location: Patient: At her home  Provider: At my home    I discussed the limitations of evaluation and management by telemedicine and the availability of in person appointments. The patient expressed understanding and agreed to proceed.  History of Present Illness: 06/02/2018 SS: [name] is a 68 year old female with a history of essential tremor.  She is currently taking primidone 50 mg tablets, 2 tablets in the morning, 3 tablets at night.  She reports her tremors are seen in both her hands and her legs.  She says since she has been isolated for the last few months, the tremor has been more bothersome, but is not anything she can't handle.  She says the tremor affects her handwriting, her husband pays all the bills.  She does not do much driving because her legs shake.  She denies any falls or trouble with her balance.  She says she has not been doing as well with her weight loss as result of the COVID 19 pandemic. She denies any new problems or concerns.  She says she sees her primary care doctor, does not think they checking a primidone level.   06/11/2017 CM: [name], 68 year old female returns for followup with essential tremor.The patient reports her tremor is about the same. The patient has been on Mysoline taking 100 mg in the morning, 150 mg in the evening. The patient also takes alprazolam 1 mg 4 times daily. The patient denies any excessive daytime drowsiness, but she still has mild tremor but this is not bothersome and does not interfere with her ability to do her ADLs.The patient indicates that her father, the paternal grandmother, and several great aunts and uncles had tremor. The patient returns to this office for further evaluation. She has recently joined [employer] and has lost about 10 pounds. She does not exercise. She returns for reevaluation   Observations/Objective: Alert, answers questions appropriately, follows commands, facial symmetry noted, using her cell phone for video visit, video was shaking, with hands outstretched, visible tremor to bilateral hands noted, gait was intact, moderate pace  Assessment and Plan: 1.  Essential tremor  Overall she appears to be doing well.  Her tremors appear stable.  I will send in a refill for her primidone 50 mg tablets, 2 tablets in the morning, 3 tablets at bedtime.  It appears her primidone level has not been checked in a while.  I will recheck blood work, she will come to the office in the next few weeks to have basic blood work done, including a primidone level.  She normally follows up on an annual basis, however she would like to return in 6 months for an office visit.  Follow Up Instructions: 6 months   I discussed the assessment and treatment plan with the patient. The patient was provided an opportunity to ask questions and all were answered. The patient agreed with the plan and demonstrated an understanding of the instructions.   The patient was advised to call back or seek an in-person evaluation if the symptoms worsen or if the condition fails to improve as anticipated.  I provided 20 minutes of non-face-to-face time during this encounter.   Butler Denmark, Laqueta Jean, DNP  [facility] [address] [place], [place]  [address] °[phone] ° ° °

## 2018-06-16 ENCOUNTER — Ambulatory Visit: Payer: PPO | Admitting: Nurse Practitioner

## 2018-06-16 ENCOUNTER — Ambulatory Visit: Payer: PPO | Admitting: Neurology

## 2018-07-08 DIAGNOSIS — I129 Hypertensive chronic kidney disease with stage 1 through stage 4 chronic kidney disease, or unspecified chronic kidney disease: Secondary | ICD-10-CM | POA: Diagnosis not present

## 2018-07-08 DIAGNOSIS — N183 Chronic kidney disease, stage 3 (moderate): Secondary | ICD-10-CM | POA: Diagnosis not present

## 2018-07-13 DIAGNOSIS — E889 Metabolic disorder, unspecified: Secondary | ICD-10-CM | POA: Diagnosis not present

## 2018-07-13 DIAGNOSIS — E1122 Type 2 diabetes mellitus with diabetic chronic kidney disease: Secondary | ICD-10-CM | POA: Diagnosis not present

## 2018-07-13 DIAGNOSIS — I129 Hypertensive chronic kidney disease with stage 1 through stage 4 chronic kidney disease, or unspecified chronic kidney disease: Secondary | ICD-10-CM | POA: Diagnosis not present

## 2018-07-13 DIAGNOSIS — R801 Persistent proteinuria, unspecified: Secondary | ICD-10-CM | POA: Diagnosis not present

## 2018-07-13 DIAGNOSIS — N183 Chronic kidney disease, stage 3 (moderate): Secondary | ICD-10-CM | POA: Diagnosis not present

## 2018-07-13 DIAGNOSIS — E559 Vitamin D deficiency, unspecified: Secondary | ICD-10-CM | POA: Diagnosis not present

## 2018-07-13 DIAGNOSIS — M9080 Osteopathy in diseases classified elsewhere, unspecified site: Secondary | ICD-10-CM | POA: Diagnosis not present

## 2018-08-18 ENCOUNTER — Telehealth: Payer: Self-pay | Admitting: Neurology

## 2018-08-18 MED ORDER — PRIMIDONE 50 MG PO TABS: ORAL_TABLET | ORAL | 5 refills | Status: DC

## 2018-08-18 NOTE — Telephone Encounter (Signed)
Pt is needing a refill on her primidone (MYSOLINE) 50 MG tablet sent to the [facility] on [address] Pt states the pharmacy has faxed over the request and she only has enough left for tomorrow. Please advise.

## 2018-09-15 DIAGNOSIS — Z780 Asymptomatic menopausal state: Secondary | ICD-10-CM | POA: Diagnosis not present

## 2018-09-15 DIAGNOSIS — F411 Generalized anxiety disorder: Secondary | ICD-10-CM | POA: Diagnosis not present

## 2018-09-15 DIAGNOSIS — Z0000 Encounter for general adult medical examination without abnormal findings: Secondary | ICD-10-CM | POA: Diagnosis not present

## 2018-09-15 DIAGNOSIS — I10 Essential (primary) hypertension: Secondary | ICD-10-CM | POA: Diagnosis not present

## 2018-09-15 DIAGNOSIS — E782 Mixed hyperlipidemia: Secondary | ICD-10-CM | POA: Diagnosis not present

## 2018-09-15 DIAGNOSIS — F339 Major depressive disorder, recurrent, unspecified: Secondary | ICD-10-CM | POA: Diagnosis not present

## 2018-09-17 DIAGNOSIS — E1129 Type 2 diabetes mellitus with other diabetic kidney complication: Secondary | ICD-10-CM | POA: Diagnosis not present

## 2018-09-17 DIAGNOSIS — E785 Hyperlipidemia, unspecified: Secondary | ICD-10-CM | POA: Diagnosis not present

## 2018-09-17 DIAGNOSIS — I129 Hypertensive chronic kidney disease with stage 1 through stage 4 chronic kidney disease, or unspecified chronic kidney disease: Secondary | ICD-10-CM | POA: Diagnosis not present

## 2018-09-17 DIAGNOSIS — R809 Proteinuria, unspecified: Secondary | ICD-10-CM | POA: Diagnosis not present

## 2018-10-13 DIAGNOSIS — R232 Flushing: Secondary | ICD-10-CM | POA: Diagnosis not present

## 2018-10-13 DIAGNOSIS — Z01419 Encounter for gynecological examination (general) (routine) without abnormal findings: Secondary | ICD-10-CM | POA: Diagnosis not present

## 2018-11-09 DIAGNOSIS — Z1231 Encounter for screening mammogram for malignant neoplasm of breast: Secondary | ICD-10-CM | POA: Diagnosis not present

## 2018-11-27 DIAGNOSIS — R922 Inconclusive mammogram: Secondary | ICD-10-CM | POA: Diagnosis not present

## 2018-11-27 DIAGNOSIS — R928 Other abnormal and inconclusive findings on diagnostic imaging of breast: Secondary | ICD-10-CM | POA: Diagnosis not present

## 2018-11-27 DIAGNOSIS — N6489 Other specified disorders of breast: Secondary | ICD-10-CM | POA: Diagnosis not present

## 2018-11-27 DIAGNOSIS — N6002 Solitary cyst of left breast: Secondary | ICD-10-CM | POA: Diagnosis not present

## 2018-12-09 DIAGNOSIS — N1832 Chronic kidney disease, stage 3b: Secondary | ICD-10-CM | POA: Diagnosis not present

## 2018-12-09 DIAGNOSIS — I10 Essential (primary) hypertension: Secondary | ICD-10-CM | POA: Diagnosis not present

## 2018-12-09 DIAGNOSIS — E782 Mixed hyperlipidemia: Secondary | ICD-10-CM | POA: Diagnosis not present

## 2019-01-20 DIAGNOSIS — E875 Hyperkalemia: Secondary | ICD-10-CM | POA: Diagnosis not present

## 2019-01-28 DIAGNOSIS — I129 Hypertensive chronic kidney disease with stage 1 through stage 4 chronic kidney disease, or unspecified chronic kidney disease: Secondary | ICD-10-CM | POA: Diagnosis not present

## 2019-01-28 DIAGNOSIS — M9080 Osteopathy in diseases classified elsewhere, unspecified site: Secondary | ICD-10-CM | POA: Diagnosis not present

## 2019-01-28 DIAGNOSIS — E889 Metabolic disorder, unspecified: Secondary | ICD-10-CM | POA: Diagnosis not present

## 2019-01-28 DIAGNOSIS — E1122 Type 2 diabetes mellitus with diabetic chronic kidney disease: Secondary | ICD-10-CM | POA: Diagnosis not present

## 2019-01-28 DIAGNOSIS — N1832 Chronic kidney disease, stage 3b: Secondary | ICD-10-CM | POA: Diagnosis not present

## 2019-01-28 DIAGNOSIS — N183 Chronic kidney disease, stage 3 unspecified: Secondary | ICD-10-CM | POA: Diagnosis not present

## 2019-01-28 DIAGNOSIS — Z6841 Body Mass Index (BMI) 40.0 and over, adult: Secondary | ICD-10-CM | POA: Diagnosis not present

## 2019-01-28 DIAGNOSIS — E559 Vitamin D deficiency, unspecified: Secondary | ICD-10-CM | POA: Diagnosis not present

## 2019-01-28 DIAGNOSIS — R801 Persistent proteinuria, unspecified: Secondary | ICD-10-CM | POA: Diagnosis not present

## 2019-02-11 ENCOUNTER — Other Ambulatory Visit: Payer: Self-pay | Admitting: *Deleted

## 2019-02-11 MED ORDER — PRIMIDONE 50 MG PO TABS: ORAL_TABLET | ORAL | 5 refills | Status: DC

## 2019-03-18 DIAGNOSIS — Z23 Encounter for immunization: Secondary | ICD-10-CM | POA: Diagnosis not present

## 2019-03-18 DIAGNOSIS — I10 Essential (primary) hypertension: Secondary | ICD-10-CM | POA: Diagnosis not present

## 2019-04-12 ENCOUNTER — Telehealth: Payer: Self-pay

## 2019-04-12 NOTE — Telephone Encounter (Signed)
Voicemail left at 2:13p:  No message was left by patient, just her name and phone number.

## 2019-04-13 NOTE — Telephone Encounter (Signed)
Left message requesting a call back. The patient was last seen 06/02/18 and was planning on returning in six months.    When she calls back, please schedule a follow up w/ NP. If she has a medical question, please get more information and route back.

## 2019-04-27 NOTE — Progress Notes (Signed)
    Virtual Visit via Video Note  I connected with [name] on 04/28/19 at  2:15 PM EDT by a video enabled telemedicine application and verified that I am speaking with the correct person using two identifiers.  Location: Patient: at her home Provider: in the office   I discussed the limitations of evaluation and management by telemedicine and the availability of in person appointments. The patient expressed understanding and agreed to proceed.  History of Present Illness: 04/28/2019 SS: [name] is a 69 year old female with history of essential tremor.  She remains on primidone 50 mg tablet, 2 in the morning, 3 at night.  She is tolerating well without side effect.  She has tremors in both her hands and legs.  The tremor affects her handwriting, says it is really not good.  The tremor is worse if she is overly tired or anxious.  She denies any falls or trouble with balance.  She denies any new problems or concerns, says her overall health has been good.  She presents today for evaluation via telephone visit.  06/02/2018 SS: [name] is a 69 year old female with a history of essential tremor.  She is currently taking primidone 50 mg tablets, 2 tablets in the morning, 3 tablets at night.  She reports her tremors are seen in both her hands and her legs.  She says since she has been isolated for the last few months, the tremor has been more bothersome, but is not anything she can't handle.  She says the tremor affects her handwriting, her husband pays all the bills.  She does not do much driving because her legs shake.  She denies any falls or trouble with her balance.  She says she has not been doing as well with her weight loss as result of the COVID 19 pandemic. She denies any new problems or concerns.  She says she sees her primary care doctor, does not think they checking a primidone level   Observations/Objective: Via telephone visit, is alert and oriented, speech is clear and concise   Assessment and Plan: 1.  Essential tremor  She seems to be doing well.  She will remain on primidone 50 mg tablet, 2 in the morning, 3 at bedtime.  She will come by the office in the next several weeks to have routine lab work done.  She will follow-up for an office visit in 6 months or sooner if needed.  Primidone was filled in January, with 5 refills, once labs result, will add additional refills.  Follow Up Instructions: 11/02/2019 2:15   I discussed the assessment and treatment plan with the patient. The patient was provided an opportunity to ask questions and all were answered. The patient agreed with the plan and demonstrated an understanding of the instructions.   The patient was advised to call back or seek an in-person evaluation if the symptoms worsen or if the condition fails to improve as anticipated.  I provided 20 minutes of non-face-to-face time during this encounter. Evangeline Dakin, DNP  [facility] [address] [place], [place] [address] [phone]

## 2019-04-28 ENCOUNTER — Encounter: Payer: Self-pay | Admitting: Neurology

## 2019-04-28 ENCOUNTER — Telehealth (INDEPENDENT_AMBULATORY_CARE_PROVIDER_SITE_OTHER): Payer: PPO | Admitting: Neurology

## 2019-04-28 DIAGNOSIS — G250 Essential tremor: Secondary | ICD-10-CM | POA: Diagnosis not present

## 2019-04-28 NOTE — Progress Notes (Signed)
I have read the note, and I agree with the clinical assessment and plan.  Venola Castello K Koraima Albertsen   

## 2019-05-25 DIAGNOSIS — N183 Chronic kidney disease, stage 3 unspecified: Secondary | ICD-10-CM | POA: Diagnosis not present

## 2019-05-25 DIAGNOSIS — H6012 Cellulitis of left external ear: Secondary | ICD-10-CM | POA: Diagnosis not present

## 2019-05-25 DIAGNOSIS — I129 Hypertensive chronic kidney disease with stage 1 through stage 4 chronic kidney disease, or unspecified chronic kidney disease: Secondary | ICD-10-CM | POA: Diagnosis not present

## 2019-07-12 DIAGNOSIS — K621 Rectal polyp: Secondary | ICD-10-CM | POA: Diagnosis not present

## 2019-07-12 DIAGNOSIS — D128 Benign neoplasm of rectum: Secondary | ICD-10-CM | POA: Diagnosis not present

## 2019-07-12 DIAGNOSIS — Z794 Long term (current) use of insulin: Secondary | ICD-10-CM | POA: Diagnosis not present

## 2019-07-12 DIAGNOSIS — N183 Chronic kidney disease, stage 3 unspecified: Secondary | ICD-10-CM | POA: Diagnosis not present

## 2019-07-12 DIAGNOSIS — Z86010 Personal history of colonic polyps: Secondary | ICD-10-CM | POA: Diagnosis not present

## 2019-07-12 DIAGNOSIS — I1310 Hypertensive heart and chronic kidney disease without heart failure, with stage 1 through stage 4 chronic kidney disease, or unspecified chronic kidney disease: Secondary | ICD-10-CM | POA: Diagnosis not present

## 2019-07-12 DIAGNOSIS — K635 Polyp of colon: Secondary | ICD-10-CM | POA: Diagnosis not present

## 2019-07-12 DIAGNOSIS — Z6841 Body Mass Index (BMI) 40.0 and over, adult: Secondary | ICD-10-CM | POA: Diagnosis not present

## 2019-07-12 DIAGNOSIS — Z1211 Encounter for screening for malignant neoplasm of colon: Secondary | ICD-10-CM | POA: Diagnosis not present

## 2019-07-12 DIAGNOSIS — E1122 Type 2 diabetes mellitus with diabetic chronic kidney disease: Secondary | ICD-10-CM | POA: Diagnosis not present

## 2019-07-12 DIAGNOSIS — E669 Obesity, unspecified: Secondary | ICD-10-CM | POA: Diagnosis not present

## 2019-07-12 DIAGNOSIS — K648 Other hemorrhoids: Secondary | ICD-10-CM | POA: Diagnosis not present

## 2019-07-12 DIAGNOSIS — K5730 Diverticulosis of large intestine without perforation or abscess without bleeding: Secondary | ICD-10-CM | POA: Diagnosis not present

## 2019-07-12 DIAGNOSIS — D124 Benign neoplasm of descending colon: Secondary | ICD-10-CM | POA: Diagnosis not present

## 2019-07-12 DIAGNOSIS — D126 Benign neoplasm of colon, unspecified: Secondary | ICD-10-CM | POA: Diagnosis not present

## 2019-07-21 DIAGNOSIS — N183 Chronic kidney disease, stage 3 unspecified: Secondary | ICD-10-CM | POA: Diagnosis not present

## 2019-07-21 DIAGNOSIS — E1122 Type 2 diabetes mellitus with diabetic chronic kidney disease: Secondary | ICD-10-CM | POA: Diagnosis not present

## 2019-07-21 DIAGNOSIS — E782 Mixed hyperlipidemia: Secondary | ICD-10-CM | POA: Diagnosis not present

## 2019-07-21 DIAGNOSIS — I129 Hypertensive chronic kidney disease with stage 1 through stage 4 chronic kidney disease, or unspecified chronic kidney disease: Secondary | ICD-10-CM | POA: Diagnosis not present

## 2019-07-24 DIAGNOSIS — N183 Chronic kidney disease, stage 3 unspecified: Secondary | ICD-10-CM | POA: Diagnosis not present

## 2019-07-24 DIAGNOSIS — I129 Hypertensive chronic kidney disease with stage 1 through stage 4 chronic kidney disease, or unspecified chronic kidney disease: Secondary | ICD-10-CM | POA: Diagnosis not present

## 2019-07-29 DIAGNOSIS — E559 Vitamin D deficiency, unspecified: Secondary | ICD-10-CM | POA: Diagnosis not present

## 2019-07-29 DIAGNOSIS — R801 Persistent proteinuria, unspecified: Secondary | ICD-10-CM | POA: Diagnosis not present

## 2019-07-29 DIAGNOSIS — N1832 Chronic kidney disease, stage 3b: Secondary | ICD-10-CM | POA: Diagnosis not present

## 2019-07-29 DIAGNOSIS — E889 Metabolic disorder, unspecified: Secondary | ICD-10-CM | POA: Diagnosis not present

## 2019-07-29 DIAGNOSIS — D631 Anemia in chronic kidney disease: Secondary | ICD-10-CM | POA: Diagnosis not present

## 2019-07-29 DIAGNOSIS — I129 Hypertensive chronic kidney disease with stage 1 through stage 4 chronic kidney disease, or unspecified chronic kidney disease: Secondary | ICD-10-CM | POA: Diagnosis not present

## 2019-07-29 DIAGNOSIS — N183 Chronic kidney disease, stage 3 unspecified: Secondary | ICD-10-CM | POA: Diagnosis not present

## 2019-07-29 DIAGNOSIS — M9080 Osteopathy in diseases classified elsewhere, unspecified site: Secondary | ICD-10-CM | POA: Diagnosis not present

## 2019-07-29 DIAGNOSIS — E1122 Type 2 diabetes mellitus with diabetic chronic kidney disease: Secondary | ICD-10-CM | POA: Diagnosis not present

## 2019-08-16 ENCOUNTER — Other Ambulatory Visit: Payer: Self-pay | Admitting: Neurology

## 2019-08-16 ENCOUNTER — Other Ambulatory Visit: Payer: Self-pay

## 2019-08-16 MED ORDER — PRIMIDONE 50 MG PO TABS: ORAL_TABLET | ORAL | 1 refills | Status: DC

## 2019-08-16 NOTE — Telephone Encounter (Signed)
Refill sent in today

## 2019-08-16 NOTE — Telephone Encounter (Signed)
Pt is requesting a refill for primidone (MYSOLINE) 50 MG tablet.  Pharmacy:  [facility] [phone]

## 2019-10-15 ENCOUNTER — Telehealth: Payer: Self-pay | Admitting: Neurology

## 2019-10-15 MED ORDER — PRIMIDONE 50 MG PO TABS: ORAL_TABLET | ORAL | 11 refills | Status: DC

## 2019-10-15 NOTE — Telephone Encounter (Signed)
Meds ordered this encounter  Medications  . primidone (MYSOLINE) 50 MG tablet    Sig: Take 2 tablets every morning and 3 tablets at night    Dispense:  150 tablet    Refill:  11

## 2019-10-27 DIAGNOSIS — I129 Hypertensive chronic kidney disease with stage 1 through stage 4 chronic kidney disease, or unspecified chronic kidney disease: Secondary | ICD-10-CM | POA: Diagnosis not present

## 2019-10-27 DIAGNOSIS — N183 Chronic kidney disease, stage 3 unspecified: Secondary | ICD-10-CM | POA: Diagnosis not present

## 2019-11-01 DIAGNOSIS — R801 Persistent proteinuria, unspecified: Secondary | ICD-10-CM | POA: Diagnosis not present

## 2019-11-01 DIAGNOSIS — E559 Vitamin D deficiency, unspecified: Secondary | ICD-10-CM | POA: Diagnosis not present

## 2019-11-01 DIAGNOSIS — N184 Chronic kidney disease, stage 4 (severe): Secondary | ICD-10-CM | POA: Diagnosis not present

## 2019-11-01 DIAGNOSIS — N1832 Chronic kidney disease, stage 3b: Secondary | ICD-10-CM | POA: Diagnosis not present

## 2019-11-01 DIAGNOSIS — N183 Chronic kidney disease, stage 3 unspecified: Secondary | ICD-10-CM | POA: Diagnosis not present

## 2019-11-01 DIAGNOSIS — I129 Hypertensive chronic kidney disease with stage 1 through stage 4 chronic kidney disease, or unspecified chronic kidney disease: Secondary | ICD-10-CM | POA: Diagnosis not present

## 2019-11-01 DIAGNOSIS — E1122 Type 2 diabetes mellitus with diabetic chronic kidney disease: Secondary | ICD-10-CM | POA: Diagnosis not present

## 2019-11-01 DIAGNOSIS — M9080 Osteopathy in diseases classified elsewhere, unspecified site: Secondary | ICD-10-CM | POA: Diagnosis not present

## 2019-11-01 DIAGNOSIS — E889 Metabolic disorder, unspecified: Secondary | ICD-10-CM | POA: Diagnosis not present

## 2019-11-01 DIAGNOSIS — D631 Anemia in chronic kidney disease: Secondary | ICD-10-CM | POA: Diagnosis not present

## 2019-11-02 ENCOUNTER — Ambulatory Visit: Payer: PPO | Admitting: Neurology

## 2019-11-02 NOTE — Progress Notes (Deleted)
PATIENT: [name] DOB: Jan 06, 1951  REASON FOR VISIT: follow up HISTORY FROM: patient  HISTORY OF PRESENT ILLNESS: Today 11/02/19  [name] is a 69 year old female with history of essential tremor, on primidone.  The tremor affects both hands and legs.  HISTORY 04/28/2019 SS: [name] is a 69 year old female with history of essential tremor.  She remains on primidone 50 mg tablet, 2 in the morning, 3 at night.  She is tolerating well without side effect.  She has tremors in both her hands and legs.  The tremor affects her handwriting, says it is really not good.  The tremor is worse if she is overly tired or anxious.  She denies any falls or trouble with balance.  She denies any new problems or concerns, says her overall health has been good.  She presents today for evaluation via telephone visit.   REVIEW OF SYSTEMS: Out of a complete 14 system review of symptoms, the patient complains only of the following symptoms, and all other reviewed systems are negative.  ALLERGIES: Allergies  Allergen Reactions   Codeine    Demerol    Hydrochlorothiazide     Nauseated, tremors get worse   Meperidine Hcl    Niacin-Lovastatin Er     "makes me feel not real'   Topiramate     HOME MEDICATIONS: Outpatient Medications Prior to Visit  Medication Sig Dispense Refill   ALPRAZolam (XANAX) 1 MG tablet Take 1 mg by mouth 4 (four) times daily.      amLODipine (NORVASC) 5 MG tablet Take 5 mg by mouth daily.   0   BD ULTRA-FINE PEN NEEDLES 29G X 12.7MM MISC USE FIVE TIMES DAILY FOR INSULIN INJECTIONS 400 each 2   Blood Glucose Monitoring Suppl (ONE TOUCH ULTRA SYSTEM KIT) W/DEVICE KIT Use as instructed to check blood sugars four times a day ( Dx Code 250.020) 1 each 0   Calcium Carbonate-Vitamin D (CALCIUM 600+D) 600-400 MG-UNIT per tablet Take 1 tablet by mouth daily.       estradiol (VIVELLE-DOT) 0.1 MG/24HR Place 1 patch onto the skin 2 (two) times a week.     ferrous sulfate  325 (65 FE) MG tablet Take 325 mg by mouth daily.       furosemide (LASIX) 20 MG tablet take 1 tablet by mouth twice a day 30 tablet 2   gabapentin (NEURONTIN) 300 MG capsule Take 300 mg by mouth 3 (three) times daily.      HUMALOG KWIKPEN 100 UNIT/ML SOPN INJECT 30 UNITS BEFORE BREAKFAST, 30 UNITS BEFORE LUNCH, AND 50 UNITS BEFORE DINNER (Patient taking differently: INJECT 60 UNITS BEFORE BREAKFAST, AND 70 UNITS BEFORE DINNER) 3 mL 3   labetalol (NORMODYNE) 200 MG tablet Take 400 mg by mouth 2 (two) times daily.   0   LEVEMIR FLEXTOUCH 100 UNIT/ML Pen   0   mirtazapine (REMERON) 15 MG tablet Take 15 mg by mouth at bedtime.      Omega-3 Fatty Acids (FISH OIL) 1000 MG CAPS Take 1,200 mg by mouth. Take 4 capsules daily.     ONE TOUCH ULTRA TEST test strip USE 4 TIMES DAILY TO CHECK BLOOD SUGARS 300 each 3   PARoxetine (PAXIL) 30 MG tablet Take 30 mg by mouth 2 (two) times daily.     primidone (MYSOLINE) 50 MG tablet Take 2 tablets every morning and 3 tablets at night 150 tablet 11   QUEtiapine (SEROQUEL) 400 MG tablet One half tablet in the morning and  one tablet in the evening     rosuvastatin (CRESTOR) 40 MG tablet Take 40 mg by mouth daily.     TRULICITY 6.20 BT/5.9RC SOPN Every 7 days (sunday)     valsartan (DIOVAN) 320 MG tablet Take 320 mg by mouth daily.   0   No facility-administered medications prior to visit.    PAST MEDICAL HISTORY: Past Medical History:  Diagnosis Date   Allergic rhinitis    Anxiety    Benign essential tremor    CRI (chronic renal insufficiency)    Depression    Diabetes mellitus, type 2 (HCC)    Diverticulosis of colon    Essential and other specified forms of tremor 12/29/2012   GERD (gastroesophageal reflux disease)    Hyperlipemia    IBS (irritable bowel syndrome)    Iron deficiency anemia    Morbidly obese (HCC)    Pernicious anemia     PAST SURGICAL HISTORY: Past Surgical History:  Procedure Laterality Date    ABDOMINAL HYSTERECTOMY     BILATERAL OOPHORECTOMY     ovarian cysts   BREAST LUMPECTOMY Right 11/2016   CESAREAN SECTION     polyps removed     colonoscopy   TONSILLECTOMY      FAMILY HISTORY: Family History  Problem Relation Age of Onset   Heart disease Mother    Diabetes Father    Hypertension Father    Heart disease Sister    Stroke Sister    Diabetes Sister    Heart disease Unknown    Diabetes Unknown    Colitis Unknown        ulcerative    SOCIAL HISTORY: Social History   Socioeconomic History   Marital status: Married    Spouse name: Raeford    Number of children: 2   Years of education: 14   Highest education level: Not on file  Occupational History    Employer: UNEMPLOYED  Tobacco Use   Smoking status: Never Smoker   Smokeless tobacco: Never Used  Substance and Sexual Activity   Alcohol use: No   Drug use: Not on file   Sexual activity: Not on file  Other Topics Concern   Not on file  Social History Narrative   Patient lives at home with husband Raeford.   Patient has 2 children. One past away last year.    Patient is currently not working.    Patient has 2 years of college.    Patient is right handed.             Social Determinants of Health   Financial Resource Strain:    Difficulty of Paying Living Expenses: Not on file  Food Insecurity:    Worried About Charity fundraiser in the Last Year: Not on file   [employer] of Food in the Last Year: Not on file  Transportation Needs:    Lack of Transportation (Medical): Not on file   Lack of Transportation (Non-Medical): Not on file  Physical Activity:    Days of Exercise per Week: Not on file   Minutes of Exercise per Session: Not on file  Stress:    Feeling of Stress : Not on file  Social Connections:    Frequency of Communication with Friends and Family: Not on file   Frequency of Social Gatherings with Friends and Family: Not on file   Attends Religious  Services: Not on file   Active Member of Clubs or Organizations: Not on file   Attends  Music therapist: Not on file   Marital Status: Not on file  Intimate Partner Violence:    Fear of Current or Ex-Partner: Not on file   Emotionally Abused: Not on file   Physically Abused: Not on file   Sexually Abused: Not on file      PHYSICAL EXAM  There were no vitals filed for this visit. There is no height or weight on file to calculate BMI.  Generalized: Well developed, in no acute distress   Neurological examination  Mentation: Alert oriented to time, place, history taking. Follows all commands speech and language fluent Cranial nerve II-XII: Pupils were equal round reactive to light. Extraocular movements were full, visual field were full on confrontational test. Facial sensation and strength were normal. Uvula tongue midline. Head turning and shoulder shrug  were normal and symmetric. Motor: The motor testing reveals 5 over 5 strength of all 4 extremities. Good symmetric motor tone is noted throughout.  Sensory: Sensory testing is intact to soft touch on all 4 extremities. No evidence of extinction is noted.  Coordination: Cerebellar testing reveals good finger-nose-finger and heel-to-shin bilaterally.  Gait and station: Gait is normal. Tandem gait is normal. Romberg is negative. No drift is seen.  Reflexes: Deep tendon reflexes are symmetric and normal bilaterally.   DIAGNOSTIC DATA (LABS, IMAGING, TESTING) - I reviewed patient records, labs, notes, testing and imaging myself where available.  Lab Results  Component Value Date   WBC 13.5 (H) 06/29/2013   HGB 11.6 06/29/2013   HCT 35.5 06/29/2013   MCV 92 06/29/2013   PLT 265 12/29/2012      Component Value Date/Time   NA 140 06/29/2013 1549   K 4.5 06/29/2013 1549   CL 97 06/29/2013 1549   CO2 24 06/29/2013 1549   GLUCOSE 129 (H) 06/29/2013 1549   GLUCOSE 152 (H) 03/26/2012 1035   GLUCOSE 168 (H)  01/31/2006 0816   BUN 26 06/29/2013 1549   CREATININE 1.82 (H) 06/29/2013 1549   CREATININE 1.62 (H) 07/05/2011 1646   CALCIUM 10.1 06/29/2013 1549   CALCIUM 9.0 05/22/2007 0215   PROT 6.4 06/29/2013 1549   ALBUMIN 4.2 06/29/2013 1549   AST 17 06/29/2013 1549   ALT 28 06/29/2013 1549   ALKPHOS 97 06/29/2013 1549   BILITOT 0.3 06/29/2013 1549   GFRNONAA 30 (L) 06/29/2013 1549   GFRNONAA 36 (L) 05/18/2010 1104   GFRAA 34 (L) 06/29/2013 1549   GFRAA 43 (L) 05/18/2010 1104   Lab Results  Component Value Date   CHOL 167 09/25/2011   HDL 44.60 09/25/2011   LDLCALC 104 (H) 01/02/2010   LDLDIRECT 83.6 09/25/2011   TRIG 257.0 (H) 09/25/2011   CHOLHDL 4 09/25/2011   Lab Results  Component Value Date   HGBA1C 6.8 (H) 03/26/2012   Lab Results  Component Value Date   VITAMINB12 487 09/04/2010   Lab Results  Component Value Date   TSH 2.08 04/30/2011      ASSESSMENT AND PLAN 69 y.o. year old female  has a past medical history of Allergic rhinitis, Anxiety, Benign essential tremor, CRI (chronic renal insufficiency), Depression, Diabetes mellitus, type 2 ([place]), Diverticulosis of colon, Essential and other specified forms of tremor (12/29/2012), GERD (gastroesophageal reflux disease), Hyperlipemia, IBS (irritable bowel syndrome), Iron deficiency anemia, Morbidly obese ([place]), and Pernicious anemia. here with ***   I spent 15 minutes with the patient. 50% of this time was spent   Butler Denmark, AGNP-C, DNP 11/02/2019, 5:53 AM [facility]  [address] [place], [place] [address] [phone]

## 2019-11-08 DIAGNOSIS — R232 Flushing: Secondary | ICD-10-CM | POA: Diagnosis not present

## 2019-11-11 DIAGNOSIS — I129 Hypertensive chronic kidney disease with stage 1 through stage 4 chronic kidney disease, or unspecified chronic kidney disease: Secondary | ICD-10-CM | POA: Diagnosis not present

## 2019-11-11 DIAGNOSIS — E1122 Type 2 diabetes mellitus with diabetic chronic kidney disease: Secondary | ICD-10-CM | POA: Diagnosis not present

## 2019-11-11 DIAGNOSIS — N1832 Chronic kidney disease, stage 3b: Secondary | ICD-10-CM | POA: Diagnosis not present

## 2019-11-11 DIAGNOSIS — Z794 Long term (current) use of insulin: Secondary | ICD-10-CM | POA: Diagnosis not present

## 2019-11-11 DIAGNOSIS — E782 Mixed hyperlipidemia: Secondary | ICD-10-CM | POA: Diagnosis not present

## 2020-01-27 DIAGNOSIS — N183 Chronic kidney disease, stage 3 unspecified: Secondary | ICD-10-CM | POA: Diagnosis not present

## 2020-01-27 DIAGNOSIS — E1122 Type 2 diabetes mellitus with diabetic chronic kidney disease: Secondary | ICD-10-CM | POA: Diagnosis not present

## 2020-02-02 DIAGNOSIS — Z1231 Encounter for screening mammogram for malignant neoplasm of breast: Secondary | ICD-10-CM | POA: Diagnosis not present

## 2020-02-03 DIAGNOSIS — D631 Anemia in chronic kidney disease: Secondary | ICD-10-CM | POA: Diagnosis not present

## 2020-02-03 DIAGNOSIS — E889 Metabolic disorder, unspecified: Secondary | ICD-10-CM | POA: Diagnosis not present

## 2020-02-03 DIAGNOSIS — N184 Chronic kidney disease, stage 4 (severe): Secondary | ICD-10-CM | POA: Diagnosis not present

## 2020-02-03 DIAGNOSIS — M9080 Osteopathy in diseases classified elsewhere, unspecified site: Secondary | ICD-10-CM | POA: Diagnosis not present

## 2020-02-03 DIAGNOSIS — I129 Hypertensive chronic kidney disease with stage 1 through stage 4 chronic kidney disease, or unspecified chronic kidney disease: Secondary | ICD-10-CM | POA: Diagnosis not present

## 2020-02-03 DIAGNOSIS — Z794 Long term (current) use of insulin: Secondary | ICD-10-CM | POA: Diagnosis not present

## 2020-02-03 DIAGNOSIS — E559 Vitamin D deficiency, unspecified: Secondary | ICD-10-CM | POA: Diagnosis not present

## 2020-02-03 DIAGNOSIS — E1122 Type 2 diabetes mellitus with diabetic chronic kidney disease: Secondary | ICD-10-CM | POA: Diagnosis not present

## 2020-02-03 DIAGNOSIS — R801 Persistent proteinuria, unspecified: Secondary | ICD-10-CM | POA: Diagnosis not present

## 2020-02-17 ENCOUNTER — Ambulatory Visit: Payer: PPO | Admitting: Neurology

## 2020-05-24 DIAGNOSIS — R109 Unspecified abdominal pain: Secondary | ICD-10-CM | POA: Diagnosis not present

## 2020-05-25 ENCOUNTER — Ambulatory Visit: Payer: PPO | Admitting: Neurology

## 2020-06-09 DIAGNOSIS — E278 Other specified disorders of adrenal gland: Secondary | ICD-10-CM | POA: Diagnosis not present

## 2020-06-09 DIAGNOSIS — B9689 Other specified bacterial agents as the cause of diseases classified elsewhere: Secondary | ICD-10-CM | POA: Diagnosis not present

## 2020-06-09 DIAGNOSIS — K5730 Diverticulosis of large intestine without perforation or abscess without bleeding: Secondary | ICD-10-CM | POA: Diagnosis not present

## 2020-06-09 DIAGNOSIS — R109 Unspecified abdominal pain: Secondary | ICD-10-CM | POA: Diagnosis not present

## 2020-06-09 DIAGNOSIS — D3500 Benign neoplasm of unspecified adrenal gland: Secondary | ICD-10-CM | POA: Diagnosis not present

## 2020-06-09 DIAGNOSIS — N760 Acute vaginitis: Secondary | ICD-10-CM | POA: Diagnosis not present

## 2020-06-09 DIAGNOSIS — R102 Pelvic and perineal pain: Secondary | ICD-10-CM | POA: Diagnosis not present

## 2020-06-30 DIAGNOSIS — E782 Mixed hyperlipidemia: Secondary | ICD-10-CM | POA: Diagnosis not present

## 2020-06-30 DIAGNOSIS — Z794 Long term (current) use of insulin: Secondary | ICD-10-CM | POA: Diagnosis not present

## 2020-06-30 DIAGNOSIS — N184 Chronic kidney disease, stage 4 (severe): Secondary | ICD-10-CM | POA: Diagnosis not present

## 2020-06-30 DIAGNOSIS — E1122 Type 2 diabetes mellitus with diabetic chronic kidney disease: Secondary | ICD-10-CM | POA: Diagnosis not present

## 2020-06-30 DIAGNOSIS — I129 Hypertensive chronic kidney disease with stage 1 through stage 4 chronic kidney disease, or unspecified chronic kidney disease: Secondary | ICD-10-CM | POA: Diagnosis not present

## 2020-07-26 DIAGNOSIS — E1122 Type 2 diabetes mellitus with diabetic chronic kidney disease: Secondary | ICD-10-CM | POA: Diagnosis not present

## 2020-07-26 DIAGNOSIS — N184 Chronic kidney disease, stage 4 (severe): Secondary | ICD-10-CM | POA: Diagnosis not present

## 2020-07-26 DIAGNOSIS — I129 Hypertensive chronic kidney disease with stage 1 through stage 4 chronic kidney disease, or unspecified chronic kidney disease: Secondary | ICD-10-CM | POA: Diagnosis not present

## 2020-07-26 DIAGNOSIS — Z794 Long term (current) use of insulin: Secondary | ICD-10-CM | POA: Diagnosis not present

## 2020-07-26 DIAGNOSIS — Z79899 Other long term (current) drug therapy: Secondary | ICD-10-CM | POA: Diagnosis not present

## 2020-08-03 DIAGNOSIS — Z6841 Body Mass Index (BMI) 40.0 and over, adult: Secondary | ICD-10-CM | POA: Diagnosis not present

## 2020-08-03 DIAGNOSIS — N184 Chronic kidney disease, stage 4 (severe): Secondary | ICD-10-CM | POA: Diagnosis not present

## 2020-08-03 DIAGNOSIS — I129 Hypertensive chronic kidney disease with stage 1 through stage 4 chronic kidney disease, or unspecified chronic kidney disease: Secondary | ICD-10-CM | POA: Diagnosis not present

## 2020-08-03 DIAGNOSIS — R801 Persistent proteinuria, unspecified: Secondary | ICD-10-CM | POA: Diagnosis not present

## 2020-08-03 DIAGNOSIS — D631 Anemia in chronic kidney disease: Secondary | ICD-10-CM | POA: Diagnosis not present

## 2020-08-03 DIAGNOSIS — E1122 Type 2 diabetes mellitus with diabetic chronic kidney disease: Secondary | ICD-10-CM | POA: Diagnosis not present

## 2020-08-03 DIAGNOSIS — E889 Metabolic disorder, unspecified: Secondary | ICD-10-CM | POA: Diagnosis not present

## 2020-08-03 DIAGNOSIS — Z794 Long term (current) use of insulin: Secondary | ICD-10-CM | POA: Diagnosis not present

## 2020-08-03 DIAGNOSIS — N3001 Acute cystitis with hematuria: Secondary | ICD-10-CM | POA: Diagnosis not present

## 2020-08-03 DIAGNOSIS — E559 Vitamin D deficiency, unspecified: Secondary | ICD-10-CM | POA: Diagnosis not present

## 2020-08-03 DIAGNOSIS — M9080 Osteopathy in diseases classified elsewhere, unspecified site: Secondary | ICD-10-CM | POA: Diagnosis not present

## 2020-08-07 ENCOUNTER — Ambulatory Visit: Payer: PPO | Admitting: Neurology

## 2020-08-08 DIAGNOSIS — I10 Essential (primary) hypertension: Secondary | ICD-10-CM | POA: Diagnosis not present

## 2020-08-08 DIAGNOSIS — R0600 Dyspnea, unspecified: Secondary | ICD-10-CM | POA: Diagnosis not present

## 2020-08-08 DIAGNOSIS — E782 Mixed hyperlipidemia: Secondary | ICD-10-CM | POA: Diagnosis not present

## 2020-08-09 DIAGNOSIS — R0600 Dyspnea, unspecified: Secondary | ICD-10-CM | POA: Diagnosis not present

## 2020-08-09 DIAGNOSIS — I10 Essential (primary) hypertension: Secondary | ICD-10-CM | POA: Diagnosis not present

## 2020-08-09 DIAGNOSIS — E782 Mixed hyperlipidemia: Secondary | ICD-10-CM | POA: Diagnosis not present

## 2020-08-15 DIAGNOSIS — R059 Cough, unspecified: Secondary | ICD-10-CM | POA: Diagnosis not present

## 2020-08-15 DIAGNOSIS — U071 COVID-19: Secondary | ICD-10-CM | POA: Diagnosis not present

## 2020-09-28 DIAGNOSIS — F411 Generalized anxiety disorder: Secondary | ICD-10-CM | POA: Diagnosis not present

## 2020-09-28 DIAGNOSIS — F339 Major depressive disorder, recurrent, unspecified: Secondary | ICD-10-CM | POA: Diagnosis not present

## 2020-09-28 DIAGNOSIS — E782 Mixed hyperlipidemia: Secondary | ICD-10-CM | POA: Diagnosis not present

## 2020-09-28 DIAGNOSIS — Z23 Encounter for immunization: Secondary | ICD-10-CM | POA: Diagnosis not present

## 2020-09-28 DIAGNOSIS — Z6841 Body Mass Index (BMI) 40.0 and over, adult: Secondary | ICD-10-CM | POA: Diagnosis not present

## 2020-09-28 DIAGNOSIS — E1122 Type 2 diabetes mellitus with diabetic chronic kidney disease: Secondary | ICD-10-CM | POA: Diagnosis not present

## 2020-09-28 DIAGNOSIS — N184 Chronic kidney disease, stage 4 (severe): Secondary | ICD-10-CM | POA: Diagnosis not present

## 2020-09-28 DIAGNOSIS — Z794 Long term (current) use of insulin: Secondary | ICD-10-CM | POA: Diagnosis not present

## 2020-09-28 DIAGNOSIS — D631 Anemia in chronic kidney disease: Secondary | ICD-10-CM | POA: Diagnosis not present

## 2020-09-28 DIAGNOSIS — I129 Hypertensive chronic kidney disease with stage 1 through stage 4 chronic kidney disease, or unspecified chronic kidney disease: Secondary | ICD-10-CM | POA: Diagnosis not present

## 2020-09-28 DIAGNOSIS — E559 Vitamin D deficiency, unspecified: Secondary | ICD-10-CM | POA: Diagnosis not present

## 2020-10-05 ENCOUNTER — Other Ambulatory Visit: Payer: Self-pay | Admitting: Emergency Medicine

## 2020-10-05 ENCOUNTER — Telehealth: Payer: Self-pay | Admitting: Neurology

## 2020-10-05 MED ORDER — PRIMIDONE 50 MG PO TABS: ORAL_TABLET | ORAL | 11 refills | Status: DC

## 2020-10-05 NOTE — Telephone Encounter (Signed)
Pt request refillprimidone (MYSOLINE) 50 MG tablet at [facility] [phone]

## 2020-10-06 DIAGNOSIS — I517 Cardiomegaly: Secondary | ICD-10-CM | POA: Diagnosis not present

## 2020-10-10 ENCOUNTER — Encounter: Payer: Self-pay | Admitting: Neurology

## 2020-10-10 ENCOUNTER — Ambulatory Visit: Payer: PPO | Admitting: Neurology

## 2020-10-10 ENCOUNTER — Other Ambulatory Visit: Payer: Self-pay

## 2020-10-10 VITALS — BP 162/86 | HR 74 | Ht 64.0 in | Wt 254.0 lb

## 2020-10-10 DIAGNOSIS — Z5181 Encounter for therapeutic drug level monitoring: Secondary | ICD-10-CM

## 2020-10-10 DIAGNOSIS — G250 Essential tremor: Secondary | ICD-10-CM | POA: Diagnosis not present

## 2020-10-10 MED ORDER — PRIMIDONE 50 MG PO TABS: ORAL_TABLET | ORAL | 1 refills | Status: DC

## 2020-10-10 MED ORDER — PRIMIDONE 250 MG PO TABS: 250.0000 mg | ORAL_TABLET | Freq: Every day | ORAL | 1 refills | Status: DC

## 2020-10-10 NOTE — Patient Instructions (Signed)
We will start primidone 250 mg at night, then begin taking 50 mg primidone in the morning for 3 weeks, then take 100 mg of primidone in the morning and 250 mg tablet at night.

## 2020-10-10 NOTE — Progress Notes (Signed)
Reason for visit: Essential tremor  [name] is an 70 y.o. female  History of present illness:  [name] is a 70 year old right-handed white female with a history of an essential tremor and obesity and diabetes.  The patient will have good and bad days with the tremor, when she gets upset or anxious or nervous about something she will have heightened tremor.  She is on primidone taking 100 mg in the morning and 150 mg in the evening.  She has significant limitations in performing activities of daily living because of the tremor.  She is not able to cook.  She has difficulty with handwriting and with feeding herself.  She takes alprazolam 1 mg 4 times daily for anxiety, she is on gabapentin and she is on labetalol as well, and still has severe tremor.  She has an extensive family history on her mother's side with the essential tremor.  The tremor also affects the head and neck.  The patient comes to the office today for an evaluation.  Past Medical History:  Diagnosis Date   Allergic rhinitis    Anxiety    Benign essential tremor    CRI (chronic renal insufficiency)    Depression    Diabetes mellitus, type 2 (HCC)    Diverticulosis of colon    Essential and other specified forms of tremor 12/29/2012   GERD (gastroesophageal reflux disease)    Hyperlipemia    IBS (irritable bowel syndrome)    Iron deficiency anemia    Morbidly obese (HCC)    Pernicious anemia     Past Surgical History:  Procedure Laterality Date   ABDOMINAL HYSTERECTOMY     BILATERAL OOPHORECTOMY     ovarian cysts   BREAST LUMPECTOMY Right 11/2016   CESAREAN SECTION     polyps removed     colonoscopy   TONSILLECTOMY      Family History  Problem Relation Age of Onset   Heart disease Mother    Diabetes Father    Hypertension Father    Heart disease Sister    Stroke Sister    Diabetes Sister    Heart disease Unknown    Diabetes Unknown    Colitis Unknown        ulcerative    Social history:   reports that she has never smoked. She has never used smokeless tobacco. She reports that she does not drink alcohol. No history on file for drug use.    Allergies  Allergen Reactions   Codeine    Demerol    Hydrochlorothiazide     Nauseated, tremors get worse   Meperidine Hcl    Niacin-Lovastatin Er     "makes me feel not real'   Topiramate     Medications:  Prior to Admission medications   Medication Sig Start Date End Date Taking? Authorizing Provider  ALPRAZolam Duanne Moron) 1 MG tablet Take 1 mg by mouth 4 (four) times daily.     [provider]  amLODipine (NORVASC) 5 MG tablet Take 5 mg by mouth daily.  05/28/15   [provider]  BD ULTRA-FINE PEN NEEDLES 29G X 12.7MM MISC USE FIVE TIMES DAILY FOR INSULIN INJECTIONS 11/27/12   Shawna Orleans, Doe-Hyun R, DO  Blood Glucose Monitoring Suppl (ONE TOUCH ULTRA SYSTEM KIT) W/DEVICE KIT Use as instructed to check blood sugars four times a day ( Dx Code 250.020) 08/20/10   Burnice Logan, MD  Calcium Carbonate-Vitamin D (CALCIUM 600+D) 600-400 MG-UNIT per tablet Take  1 tablet by mouth daily.      [provider]  estradiol (VIVELLE-DOT) 0.1 MG/24HR Place 1 patch onto the skin 2 (two) times a week.    [provider]  ferrous sulfate 325 (65 FE) MG tablet Take 325 mg by mouth daily.      [provider]  furosemide (LASIX) 20 MG tablet take 1 tablet by mouth twice a day    Yoo, Doe-Hyun R, DO  gabapentin (NEURONTIN) 300 MG capsule Take 300 mg by mouth 3 (three) times daily.  12/13/12   [provider]  HUMALOG KWIKPEN 100 UNIT/ML SOPN INJECT 30 UNITS BEFORE BREAKFAST, 30 UNITS BEFORE LUNCH, AND 50 UNITS BEFORE DINNER Patient taking differently: INJECT 60 UNITS BEFORE BREAKFAST, AND 70 UNITS BEFORE DINNER 07/13/12   Artist Pais, Doe-Hyun R, DO  labetalol (NORMODYNE) 200 MG tablet Take 400 mg by mouth 2 (two) times daily.  07/01/14   [provider]  LEVEMIR FLEXTOUCH 100 UNIT/ML Pen  12/17/13    [provider]  mirtazapine (REMERON) 15 MG tablet Take 15 mg by mouth at bedtime.     [provider]  Omega-3 Fatty Acids (FISH OIL) 1000 MG CAPS Take 1,200 mg by mouth. Take 4 capsules daily.    [provider]  ONE TOUCH ULTRA TEST test strip USE 4 TIMES DAILY TO CHECK BLOOD SUGARS 04/27/12   Artist Pais, Doe-Hyun R, DO  PARoxetine (PAXIL) 30 MG tablet Take 30 mg by mouth 2 (two) times daily.    [provider]  primidone (MYSOLINE) 50 MG tablet Take 2 tablets every morning and 3 tablets at night 10/05/20   Glean Salvo, NP  QUEtiapine (SEROQUEL) 400 MG tablet One half tablet in the morning and one tablet in the evening 12/29/12   York Spaniel, MD  rosuvastatin (CRESTOR) 40 MG tablet Take 40 mg by mouth daily.    [provider]  TRULICITY 0.75 MG/0.5ML SOPN Every 7 days (sunday) 07/05/14   [provider]  valsartan (DIOVAN) 320 MG tablet Take 320 mg by mouth daily.  11/27/13   [provider]    ROS:  Out of a complete 14 system review of symptoms, the patient complains only of the following symptoms, and all other reviewed systems are negative.  Tremor Walking difficulty Anxiety  Blood pressure (!) 162/86, pulse 74, height 5\' 4"  (1.626 m), weight 254 lb (115.2 kg), SpO2 95 %.  Physical Exam  General: The patient is alert and cooperative at the time of the examination.  The patient is markedly obese.  Skin: No significant peripheral edema is noted.   Neurologic Exam  Mental status: The patient is alert and oriented x 3 at the time of the examination. The patient has apparent normal recent and remote memory, with an apparently normal attention span and concentration ability.   Cranial nerves: Facial symmetry is present. Speech is normal, no aphasia or dysarthria is noted. Extraocular movements are full. Visual fields are full.  A side-to-side head tremor is seen.  Motor: The patient has good strength in all 4  extremities.  Sensory examination: Soft touch sensation is symmetric on the face, arms, and legs.  Coordination: The patient has good finger-nose-finger and heel-to-shin bilaterally.  The patient has significant tremor with finger-nose-finger bilaterally.  Gait and station: The patient has a normal gait. Tandem gait is unsteady. Romberg is negative, but is slightly unsteady. No drift is seen.  Reflexes: Deep tendon reflexes are symmetric.   Assessment/Plan:  1.  Essential tremor  The patient has a significant tremor that is impairing her ability to perform activities of daily living.  We discussed the possibility of a deep brain stimulator consultation, but the patient could potentially be excluded because a history of anxiety and depression.  The patient is on 4 medications that may help the tremor and still has severe tremor.  We will go up on the primidone taking 50 mg in the morning and 250 mg in the evening for 3 weeks and then go to 100 mg in the morning and 250 mg in the evening.  We will check primidone levels today.  She will follow-up in 6 months, in the future she can be followed through Dr. Brett Fairy.  [name] 10/10/2020 3:32 PM  [facility] [address] [place], [place] [address]  Phone [phone] Fax [phone]

## 2020-10-12 LAB — PRIMIDONE, SERUM
Phenobarbital, Serum: 6 ug/mL — ABNORMAL LOW (ref 15–40)
Primidone Lvl: 6.4 ug/mL (ref 5.0–12.0)

## 2020-11-03 DIAGNOSIS — I129 Hypertensive chronic kidney disease with stage 1 through stage 4 chronic kidney disease, or unspecified chronic kidney disease: Secondary | ICD-10-CM | POA: Diagnosis not present

## 2020-11-03 DIAGNOSIS — Z794 Long term (current) use of insulin: Secondary | ICD-10-CM | POA: Diagnosis not present

## 2020-11-03 DIAGNOSIS — N184 Chronic kidney disease, stage 4 (severe): Secondary | ICD-10-CM | POA: Diagnosis not present

## 2020-11-03 DIAGNOSIS — E1122 Type 2 diabetes mellitus with diabetic chronic kidney disease: Secondary | ICD-10-CM | POA: Diagnosis not present

## 2020-11-07 DIAGNOSIS — M898X9 Other specified disorders of bone, unspecified site: Secondary | ICD-10-CM | POA: Diagnosis not present

## 2020-11-07 DIAGNOSIS — D631 Anemia in chronic kidney disease: Secondary | ICD-10-CM | POA: Diagnosis not present

## 2020-11-07 DIAGNOSIS — N184 Chronic kidney disease, stage 4 (severe): Secondary | ICD-10-CM | POA: Diagnosis not present

## 2020-11-07 DIAGNOSIS — E559 Vitamin D deficiency, unspecified: Secondary | ICD-10-CM | POA: Diagnosis not present

## 2020-11-07 DIAGNOSIS — R801 Persistent proteinuria, unspecified: Secondary | ICD-10-CM | POA: Diagnosis not present

## 2020-11-07 DIAGNOSIS — I129 Hypertensive chronic kidney disease with stage 1 through stage 4 chronic kidney disease, or unspecified chronic kidney disease: Secondary | ICD-10-CM | POA: Diagnosis not present

## 2020-11-07 DIAGNOSIS — E1122 Type 2 diabetes mellitus with diabetic chronic kidney disease: Secondary | ICD-10-CM | POA: Diagnosis not present

## 2020-11-07 DIAGNOSIS — Z794 Long term (current) use of insulin: Secondary | ICD-10-CM | POA: Diagnosis not present

## 2020-11-14 DIAGNOSIS — I517 Cardiomegaly: Secondary | ICD-10-CM | POA: Diagnosis not present

## 2020-11-14 DIAGNOSIS — D631 Anemia in chronic kidney disease: Secondary | ICD-10-CM | POA: Diagnosis not present

## 2020-11-14 DIAGNOSIS — I129 Hypertensive chronic kidney disease with stage 1 through stage 4 chronic kidney disease, or unspecified chronic kidney disease: Secondary | ICD-10-CM | POA: Diagnosis not present

## 2020-11-14 DIAGNOSIS — N184 Chronic kidney disease, stage 4 (severe): Secondary | ICD-10-CM | POA: Diagnosis not present

## 2020-11-24 DIAGNOSIS — Z7985 Long-term (current) use of injectable non-insulin antidiabetic drugs: Secondary | ICD-10-CM | POA: Diagnosis not present

## 2020-11-24 DIAGNOSIS — E1129 Type 2 diabetes mellitus with other diabetic kidney complication: Secondary | ICD-10-CM | POA: Diagnosis not present

## 2020-11-24 DIAGNOSIS — Z794 Long term (current) use of insulin: Secondary | ICD-10-CM | POA: Diagnosis not present

## 2020-11-24 DIAGNOSIS — Z7984 Long term (current) use of oral hypoglycemic drugs: Secondary | ICD-10-CM | POA: Diagnosis not present

## 2020-11-24 DIAGNOSIS — E785 Hyperlipidemia, unspecified: Secondary | ICD-10-CM | POA: Diagnosis not present

## 2020-11-24 DIAGNOSIS — I10 Essential (primary) hypertension: Secondary | ICD-10-CM | POA: Diagnosis not present

## 2021-02-12 ENCOUNTER — Ambulatory Visit: Payer: PPO | Admitting: Neurology

## 2021-03-06 ENCOUNTER — Ambulatory Visit: Payer: PPO | Admitting: Neurology

## 2021-06-28 ENCOUNTER — Telehealth: Payer: Self-pay | Admitting: Neurology

## 2021-06-28 NOTE — Telephone Encounter (Signed)
LVM and sent mychart msg informing pt of r/s needed for 8/8 appt- Judson Roch out.

## 2021-08-28 ENCOUNTER — Ambulatory Visit: Payer: PPO | Admitting: Neurology

## 2021-09-04 ENCOUNTER — Other Ambulatory Visit: Payer: Self-pay

## 2021-09-04 MED ORDER — PRIMIDONE 50 MG PO TABS: ORAL_TABLET | ORAL | 1 refills | Status: DC

## 2021-09-04 NOTE — Progress Notes (Signed)
Rx refilled as per last office visit note. 

## 2021-09-26 ENCOUNTER — Telehealth: Payer: Self-pay | Admitting: Neurology

## 2021-09-26 MED ORDER — PRIMIDONE 250 MG PO TABS: 250.0000 mg | ORAL_TABLET | Freq: Every day | ORAL | 1 refills | Status: DC

## 2021-09-26 NOTE — Telephone Encounter (Signed)
Pt is calling. Stated she needed a refill on  primidone (MYSOLINE) 50 MG tablet and  primidone (MYSOLINE) 250 MG tablet. Pt is requesting refill be sent to [id number].

## 2021-09-26 NOTE — Telephone Encounter (Signed)
Refill has been sent for both meds.

## 2022-03-07 NOTE — Progress Notes (Deleted)
Patient: [name] Date of Birth: 10/19/1950  Reason for Visit: Follow up History from: Patient Primary Neurologist: [name]    ASSESSMENT AND PLAN 72 y.o. year old female    HISTORY OF PRESENT ILLNESS: Today 03/07/22  HISTORY  10/10/20 Dr. Jannifer Booker: [name] is a 72 year old right-handed white female with a history of an essential tremor and obesity and diabetes.  The patient will have good and bad days with the tremor, when she gets upset or anxious or nervous about something she will have heightened tremor.  She is on primidone taking 100 mg in the morning and 150 mg in the evening.  She has significant limitations in performing activities of daily living because of the tremor.  She is not able to cook.  She has difficulty with handwriting and with feeding herself.  She takes alprazolam 1 mg 4 times daily for anxiety, she is on gabapentin and she is on labetalol as well, and still has severe tremor.  She has an extensive family history on her mother's side with the essential tremor.  The tremor also affects the head and neck.  The patient comes to the office today for an evaluation.   REVIEW OF SYSTEMS: Out of a complete 14 system review of symptoms, the patient complains only of the following symptoms, and all other reviewed systems are negative.  See HPI  ALLERGIES: Allergies  Allergen Reactions   Codeine    Demerol    Hydrochlorothiazide     Nauseated, tremors get worse   Meperidine Hcl    Niacin-Lovastatin Er     "makes me feel not real'   Topiramate     HOME MEDICATIONS: Outpatient Medications Prior to Visit  Medication Sig Dispense Refill   ALPRAZolam (XANAX) 1 MG tablet Take 1 mg by mouth 4 (four) times daily.      amLODipine (NORVASC) 5 MG tablet Take 5 mg by mouth daily.   0   BD ULTRA-FINE PEN NEEDLES 29G X 12.7MM MISC USE FIVE TIMES DAILY FOR INSULIN INJECTIONS 400 each 2   Blood Glucose Monitoring Suppl (ONE TOUCH ULTRA SYSTEM KIT) W/DEVICE KIT  Use as instructed to check blood sugars four times a day ( Dx Code 250.020) 1 each 0   Calcium Carbonate-Vitamin D 600-400 MG-UNIT tablet Take 1 tablet by mouth daily.       dapagliflozin propanediol (FARXIGA) 5 MG TABS tablet Take by mouth daily.     Dulaglutide (TRULICITY) 3 0000000 SOPN Inject into the skin.     estradiol (VIVELLE-DOT) 0.1 MG/24HR Place 1 patch onto the skin 2 (two) times a week.     ferrous sulfate 325 (65 FE) MG tablet Take 325 mg by mouth daily.       furosemide (LASIX) 20 MG tablet take 1 tablet by mouth twice a day 30 tablet 2   gabapentin (NEURONTIN) 300 MG capsule Take 300 mg by mouth 3 (three) times daily.      Insulin Aspart (NOVOLOG FLEXPEN [place]) Inject into the skin. 60 units before breakfast, 60 units before Dinner     labetalol (NORMODYNE) 200 MG tablet Take 400 mg by mouth 2 (two) times daily.   0   LEVEMIR FLEXTOUCH 100 UNIT/ML Pen   0   mirtazapine (REMERON) 15 MG tablet Take 15 mg by mouth at bedtime.      Omega-3 Fatty Acids (FISH OIL) 1000 MG CAPS Take 1,200 mg by mouth. Take 4 capsules daily.     ONE TOUCH ULTRA  TEST test strip USE 4 TIMES DAILY TO CHECK BLOOD SUGARS 300 each 3   PARoxetine (PAXIL) 30 MG tablet Take 30 mg by mouth 2 (two) times daily.     primidone (MYSOLINE) 250 MG tablet Take 1 tablet (250 mg total) by mouth at bedtime. 90 tablet 1   primidone (MYSOLINE) 50 MG tablet Take 2 tablets every morning 180 tablet 1   QUEtiapine (SEROQUEL) 400 MG tablet One half tablet in the morning and one tablet in the evening     rosuvastatin (CRESTOR) 40 MG tablet Take 40 mg by mouth daily.     valsartan (DIOVAN) 320 MG tablet Take 320 mg by mouth daily.   0   No facility-administered medications prior to visit.    PAST MEDICAL HISTORY: Past Medical History:  Diagnosis Date   Allergic rhinitis    Anxiety    Benign essential tremor    CRI (chronic renal insufficiency)    Depression    Diabetes mellitus, type 2 (HCC)    Diverticulosis of colon     Essential and other specified forms of tremor 12/29/2012   GERD (gastroesophageal reflux disease)    Hyperlipemia    IBS (irritable bowel syndrome)    Iron deficiency anemia    Morbidly obese (HCC)    Pernicious anemia     PAST SURGICAL HISTORY: Past Surgical History:  Procedure Laterality Date   ABDOMINAL HYSTERECTOMY     BILATERAL OOPHORECTOMY     ovarian cysts   BREAST LUMPECTOMY Right 11/2016   CESAREAN SECTION     polyps removed     colonoscopy   TONSILLECTOMY      FAMILY HISTORY: Family History  Problem Relation Age of Onset   Heart disease Mother    Diabetes Father    Hypertension Father    Heart disease Sister    Stroke Sister    Diabetes Sister    Heart disease Unknown    Diabetes Unknown    Colitis Unknown        ulcerative    SOCIAL HISTORY: Social History   Socioeconomic History   Marital status: Married    Spouse name: [name]    Number of children: 2   Years of education: 14   Highest education level: Not on file  Occupational History    Employer: UNEMPLOYED  Tobacco Use   Smoking status: Never   Smokeless tobacco: Never  Substance and Sexual Activity   Alcohol use: No   Drug use: Not on file   Sexual activity: Not on file  Other Topics Concern   Not on file  Social History Narrative   Patient lives at home with husband [name].   Patient has 2 children. One past away last year.    Patient is currently not working.    Patient has 2 years of college.    Patient is right handed.             Social Determinants of Health   Financial Resource Strain: Not on file  Food Insecurity: Not on file  Transportation Needs: Not on file  Physical Activity: Not on file  Stress: Not on file  Social Connections: Not on file  Intimate Partner Violence: Not on file    PHYSICAL EXAM  There were no vitals filed for this visit. There is no height or weight on file to calculate BMI.  Generalized: Well developed, in no acute distress   Neurological examination  Mentation: Alert oriented to time, place, history taking. Follows  all commands speech and language fluent Cranial nerve II-XII: Pupils were equal round reactive to light. Extraocular movements were full, visual field were full on confrontational test. Facial sensation and strength were normal. Uvula tongue midline. Head turning and shoulder shrug  were normal and symmetric. Motor: The motor testing reveals 5 over 5 strength of all 4 extremities. Good symmetric motor tone is noted throughout.  Sensory: Sensory testing is intact to soft touch on all 4 extremities. No evidence of extinction is noted.  Coordination: Cerebellar testing reveals good finger-nose-finger and heel-to-shin bilaterally.  Gait and station: Gait is normal. Tandem gait is normal. Romberg is negative. No drift is seen.  Reflexes: Deep tendon reflexes are symmetric and normal bilaterally.   DIAGNOSTIC DATA (LABS, IMAGING, TESTING) - I reviewed patient records, labs, notes, testing and imaging myself where available.  Lab Results  Component Value Date   WBC 13.5 (H) 06/29/2013   HGB 11.6 06/29/2013   HCT 35.5 06/29/2013   MCV 92 06/29/2013   PLT 265 12/29/2012      Component Value Date/Time   NA 140 06/29/2013 1549   K 4.5 06/29/2013 1549   CL 97 06/29/2013 1549   CO2 24 06/29/2013 1549   GLUCOSE 129 (H) 06/29/2013 1549   GLUCOSE 152 (H) 03/26/2012 1035   GLUCOSE 168 (H) 01/31/2006 0816   BUN 26 06/29/2013 1549   CREATININE 1.82 (H) 06/29/2013 1549   CREATININE 1.62 (H) 07/05/2011 1646   CALCIUM 10.1 06/29/2013 1549   CALCIUM 9.0 05/22/2007 0215   PROT 6.4 06/29/2013 1549   ALBUMIN 4.2 06/29/2013 1549   AST 17 06/29/2013 1549   ALT 28 06/29/2013 1549   ALKPHOS 97 06/29/2013 1549   BILITOT 0.3 06/29/2013 1549   GFRNONAA 30 (L) 06/29/2013 1549   GFRNONAA 36 (L) 05/18/2010 1104   GFRAA 34 (L) 06/29/2013 1549   GFRAA 43 (L) 05/18/2010 1104   Lab Results  Component Value Date    CHOL 167 09/25/2011   HDL 44.60 09/25/2011   LDLCALC 104 (H) 01/02/2010   LDLDIRECT 83.6 09/25/2011   TRIG 257.0 (H) 09/25/2011   CHOLHDL 4 09/25/2011   Lab Results  Component Value Date   HGBA1C 6.8 (H) 03/26/2012   Lab Results  Component Value Date   VITAMINB12 487 09/04/2010   Lab Results  Component Value Date   TSH 2.08 04/30/2011    Butler Denmark, AGNP-C, DNP 03/07/2022, 11:36 AM [facility] [address] [place], [place] [address] [phone]

## 2022-03-11 ENCOUNTER — Ambulatory Visit: Payer: PPO | Admitting: Neurology

## 2022-03-14 ENCOUNTER — Ambulatory Visit: Payer: PPO | Admitting: Neurology

## 2022-03-27 ENCOUNTER — Telehealth: Payer: Self-pay | Admitting: Neurology

## 2022-03-27 NOTE — Telephone Encounter (Signed)
Pt is needing a refill on her primidone (MYSOLINE) 250 MG tablet and it's needing to be sent to the [facility] in [facility].

## 2022-03-28 MED ORDER — PRIMIDONE 250 MG PO TABS: 250.0000 mg | ORAL_TABLET | Freq: Every day | ORAL | 0 refills | Status: DC

## 2022-03-28 MED ORDER — PRIMIDONE 50 MG PO TABS: ORAL_TABLET | ORAL | 0 refills | Status: DC

## 2022-03-28 NOTE — Telephone Encounter (Signed)
Called pt and left VM message to please call office

## 2022-03-28 NOTE — Telephone Encounter (Signed)
Please inform patient, medication has been sent, but MUST keep appointment for refills, pt hasn't been seen in over a year

## 2022-04-11 ENCOUNTER — Ambulatory Visit: Payer: PPO | Admitting: Neurology

## 2022-04-26 ENCOUNTER — Other Ambulatory Visit: Payer: Self-pay | Admitting: Neurology

## 2022-04-26 ENCOUNTER — Encounter: Payer: Self-pay | Admitting: Neurology

## 2022-04-26 MED ORDER — PRIMIDONE 50 MG PO TABS: ORAL_TABLET | ORAL | 0 refills | Status: DC

## 2022-04-26 MED ORDER — PRIMIDONE 250 MG PO TABS: 250.0000 mg | ORAL_TABLET | Freq: Every day | ORAL | 0 refills | Status: DC

## 2022-05-15 NOTE — Progress Notes (Deleted)
Patient: [name] Date of Birth: 1950/12/10  Reason for Visit: Follow up History from: Patient Primary Neurologist: [name]   ASSESSMENT AND PLAN 72 y.o. year old female   1.  Essential tremor   HISTORY OF PRESENT ILLNESS: Today 05/15/22 Was last seen in September 2022 by Dr. Anne Booker.  HISTORY  10/10/20 Dr. Anne Booker: [name] is a 72 year old right-handed white female with a history of an essential tremor and obesity and diabetes.  The patient will have good and bad days with the tremor, when she gets upset or anxious or nervous about something she will have heightened tremor.  She is on primidone taking 100 mg in the morning and 150 mg in the evening.  She has significant limitations in performing activities of daily living because of the tremor.  She is not able to cook.  She has difficulty with handwriting and with feeding herself.  She takes alprazolam 1 mg 4 times daily for anxiety, she is on gabapentin and she is on labetalol as well, and still has severe tremor.  She has an extensive family history on her mother's side with the essential tremor.  The tremor also affects the head and neck.  The patient comes to the office today for an evaluation.   REVIEW OF SYSTEMS: Out of a complete 14 system review of symptoms, the patient complains only of the following symptoms, and all other reviewed systems are negative.  See HPI  ALLERGIES: Allergies  Allergen Reactions   Codeine    Demerol    Hydrochlorothiazide     Nauseated, tremors get worse   Meperidine Hcl    Niacin-Lovastatin Er     "makes me feel not real'   Topiramate     HOME MEDICATIONS: Outpatient Medications Prior to Visit  Medication Sig Dispense Refill   ALPRAZolam (XANAX) 1 MG tablet Take 1 mg by mouth 4 (four) times daily.      amLODipine (NORVASC) 5 MG tablet Take 5 mg by mouth daily.   0   BD ULTRA-FINE PEN NEEDLES 29G X 12.7MM MISC USE FIVE TIMES DAILY FOR INSULIN INJECTIONS 400 each 2   Blood  Glucose Monitoring Suppl (ONE TOUCH ULTRA SYSTEM KIT) W/DEVICE KIT Use as instructed to check blood sugars four times a day ( Dx Code 250.020) 1 each 0   Calcium Carbonate-Vitamin D 600-400 MG-UNIT tablet Take 1 tablet by mouth daily.       dapagliflozin propanediol (FARXIGA) 5 MG TABS tablet Take by mouth daily.     Dulaglutide (TRULICITY) 3 MG/0.5ML SOPN Inject into the skin.     estradiol (VIVELLE-DOT) 0.1 MG/24HR Place 1 patch onto the skin 2 (two) times a week.     ferrous sulfate 325 (65 FE) MG tablet Take 325 mg by mouth daily.       furosemide (LASIX) 20 MG tablet take 1 tablet by mouth twice a day 30 tablet 2   gabapentin (NEURONTIN) 300 MG capsule Take 300 mg by mouth 3 (three) times daily.      Insulin Aspart (NOVOLOG FLEXPEN [place]) Inject into the skin. 60 units before breakfast, 60 units before Dinner     labetalol (NORMODYNE) 200 MG tablet Take 400 mg by mouth 2 (two) times daily.   0   LEVEMIR FLEXTOUCH 100 UNIT/ML Pen   0   mirtazapine (REMERON) 15 MG tablet Take 15 mg by mouth at bedtime.      Omega-3 Fatty Acids (FISH OIL) 1000 MG CAPS Take 1,200 mg  by mouth. Take 4 capsules daily.     ONE TOUCH ULTRA TEST test strip USE 4 TIMES DAILY TO CHECK BLOOD SUGARS 300 each 3   PARoxetine (PAXIL) 30 MG tablet Take 30 mg by mouth 2 (two) times daily.     primidone (MYSOLINE) 250 MG tablet Take 1 tablet (250 mg total) by mouth at bedtime. 30 tablet 0   primidone (MYSOLINE) 50 MG tablet Take 2 tablets every morning 60 tablet 0   QUEtiapine (SEROQUEL) 400 MG tablet One half tablet in the morning and one tablet in the evening     rosuvastatin (CRESTOR) 40 MG tablet Take 40 mg by mouth daily.     valsartan (DIOVAN) 320 MG tablet Take 320 mg by mouth daily.   0   No facility-administered medications prior to visit.    PAST MEDICAL HISTORY: Past Medical History:  Diagnosis Date   Allergic rhinitis    Anxiety    Benign essential tremor    CRI (chronic renal insufficiency)    Depression     Diabetes mellitus, type 2 (HCC)    Diverticulosis of colon    Essential and other specified forms of tremor 12/29/2012   GERD (gastroesophageal reflux disease)    Hyperlipemia    IBS (irritable bowel syndrome)    Iron deficiency anemia    Morbidly obese (HCC)    Pernicious anemia     PAST SURGICAL HISTORY: Past Surgical History:  Procedure Laterality Date   ABDOMINAL HYSTERECTOMY     BILATERAL OOPHORECTOMY     ovarian cysts   BREAST LUMPECTOMY Right 11/2016   CESAREAN SECTION     polyps removed     colonoscopy   TONSILLECTOMY      FAMILY HISTORY: Family History  Problem Relation Age of Onset   Heart disease Mother    Diabetes Father    Hypertension Father    Heart disease Sister    Stroke Sister    Diabetes Sister    Heart disease Unknown    Diabetes Unknown    Colitis Unknown        ulcerative    SOCIAL HISTORY: Social History   Socioeconomic History   Marital status: Married    Spouse name: Raeford    Number of children: 2   Years of education: 14   Highest education level: Not on file  Occupational History    Employer: UNEMPLOYED  Tobacco Use   Smoking status: Never   Smokeless tobacco: Never  Substance and Sexual Activity   Alcohol use: No   Drug use: Not on file   Sexual activity: Not on file  Other Topics Concern   Not on file  Social History Narrative   Patient lives at home with husband Raeford.   Patient has 2 children. One past away last year.    Patient is currently not working.    Patient has 2 years of college.    Patient is right handed.             Social Determinants of Health   Financial Resource Strain: Not on file  Food Insecurity: Not on file  Transportation Needs: Not on file  Physical Activity: Not on file  Stress: Not on file  Social Connections: Not on file  Intimate Partner Violence: Not on file    PHYSICAL EXAM  There were no vitals filed for this visit. There is no height or weight on file to calculate  BMI.  Generalized: Well developed, in no acute distress  Neurological examination  Mentation: Alert oriented to time, place, history taking. Follows all commands speech and language fluent Cranial nerve II-XII: Pupils were equal round reactive to light. Extraocular movements were full, visual field were full on confrontational test. Facial sensation and strength were normal. Uvula tongue midline. Head turning and shoulder shrug  were normal and symmetric. Motor: The motor testing reveals 5 over 5 strength of all 4 extremities. Good symmetric motor tone is noted throughout.  Sensory: Sensory testing is intact to soft touch on all 4 extremities. No evidence of extinction is noted.  Coordination: Cerebellar testing reveals good finger-nose-finger and heel-to-shin bilaterally.  Gait and station: Gait is normal. Tandem gait is normal. Romberg is negative. No drift is seen.  Reflexes: Deep tendon reflexes are symmetric and normal bilaterally.   DIAGNOSTIC DATA (LABS, IMAGING, TESTING) - I reviewed patient records, labs, notes, testing and imaging myself where available.  Lab Results  Component Value Date   WBC 13.5 (H) 06/29/2013   HGB 11.6 06/29/2013   HCT 35.5 06/29/2013   MCV 92 06/29/2013   PLT 265 12/29/2012      Component Value Date/Time   NA 140 06/29/2013 1549   K 4.5 06/29/2013 1549   CL 97 06/29/2013 1549   CO2 24 06/29/2013 1549   GLUCOSE 129 (H) 06/29/2013 1549   GLUCOSE 152 (H) 03/26/2012 1035   GLUCOSE 168 (H) 01/31/2006 0816   BUN 26 06/29/2013 1549   CREATININE 1.82 (H) 06/29/2013 1549   CREATININE 1.62 (H) 07/05/2011 1646   CALCIUM 10.1 06/29/2013 1549   CALCIUM 9.0 05/22/2007 0215   PROT 6.4 06/29/2013 1549   ALBUMIN 4.2 06/29/2013 1549   AST 17 06/29/2013 1549   ALT 28 06/29/2013 1549   ALKPHOS 97 06/29/2013 1549   BILITOT 0.3 06/29/2013 1549   GFRNONAA 30 (L) 06/29/2013 1549   GFRNONAA 36 (L) 05/18/2010 1104   GFRAA 34 (L) 06/29/2013 1549   GFRAA 43 (L)  05/18/2010 1104   Lab Results  Component Value Date   CHOL 167 09/25/2011   HDL 44.60 09/25/2011   LDLCALC 104 (H) 01/02/2010   LDLDIRECT 83.6 09/25/2011   TRIG 257.0 (H) 09/25/2011   CHOLHDL 4 09/25/2011   Lab Results  Component Value Date   HGBA1C 6.8 (H) 03/26/2012   Lab Results  Component Value Date   VITAMINB12 487 09/04/2010   Lab Results  Component Value Date   TSH 2.08 04/30/2011    Margie Ege, AGNP-C, DNP 05/15/2022, 2:00 PM [facility] [address] [place], [place] [address] [phone]

## 2022-05-16 ENCOUNTER — Ambulatory Visit: Payer: PPO | Admitting: Neurology

## 2022-05-19 ENCOUNTER — Other Ambulatory Visit: Payer: Self-pay | Admitting: Neurology

## 2022-05-21 ENCOUNTER — Telehealth: Payer: Self-pay | Admitting: Neurology

## 2022-05-21 NOTE — Telephone Encounter (Signed)
Pt husband states pt will be without this medication in a few days.  He understands that pt must be seen before she gets any more refills.  He is asking if there will be any potential side effects to pt all of a sudden coming off of primidone (MYSOLINE) 50 MG tablet.  Please call spouse with a response.

## 2022-05-21 NOTE — Telephone Encounter (Signed)
Dr. Epimenio Foot- would you be ok with fitting pt in on 05/27/22 at 3pm for sooner appt and being primary MD?  Then refilling small amount of primidone? Or should she request from PCP until see by our office?   Currently, pt is scheduled with SS,NP 06/18/22 at 2:15pm.   She cx appt on following dates/times: 02/12/21 (provider cx), 03/06/21 (husband had surgery), 08/28/21 (provider out, we cx), 03/11/22 (pt sick), 03/14/22 (provider out), 04/11/22 (pt cx) and 05/16/22 (pt sick)

## 2022-05-22 ENCOUNTER — Other Ambulatory Visit: Payer: Self-pay | Admitting: Neurology

## 2022-05-22 MED ORDER — PRIMIDONE 250 MG PO TABS: 250.0000 mg | ORAL_TABLET | Freq: Every day | ORAL | 0 refills | Status: DC

## 2022-05-22 MED ORDER — PRIMIDONE 50 MG PO TABS: ORAL_TABLET | ORAL | 0 refills | Status: DC

## 2022-05-22 NOTE — Addendum Note (Signed)
Addended by: Arther Abbott on: 05/22/2022 07:09 AM   Modules accepted: Orders

## 2022-05-22 NOTE — Telephone Encounter (Addendum)
Called pt husband. Informed him of message nurse [facility] sent. Stated pt will be at appointment on 5/28

## 2022-06-18 ENCOUNTER — Ambulatory Visit: Payer: PPO | Admitting: Neurology

## 2022-06-18 VITALS — BP 156/82 | HR 75 | Ht 64.0 in | Wt 247.0 lb

## 2022-06-18 DIAGNOSIS — G250 Essential tremor: Secondary | ICD-10-CM | POA: Diagnosis not present

## 2022-06-18 MED ORDER — PRIMIDONE 50 MG PO TABS: ORAL_TABLET | ORAL | 5 refills | Status: DC

## 2022-06-18 MED ORDER — PRIMIDONE 250 MG PO TABS: 250.0000 mg | ORAL_TABLET | Freq: Every day | ORAL | 5 refills | Status: DC

## 2022-06-18 NOTE — Patient Instructions (Addendum)
Increase primidone working up to 100 mg in the AM, 250 mg PM, check primidone level, watch out for dizziness, gait instability , follow up in 6 months with Dr. Vickey Huger   Meds ordered this encounter  Medications   primidone (MYSOLINE) 250 MG tablet    Sig: Take 1 tablet (250 mg total) by mouth at bedtime.    Dispense:  30 tablet    Refill:  5   primidone (MYSOLINE) 50 MG tablet    Sig: Start taking 1 tablet in the morning x 1 month, then take 2 tablets in the morning    Dispense:  60 tablet    Refill:  5

## 2022-06-18 NOTE — Progress Notes (Signed)
Patient: [name] Date of Birth: 1950-06-16  Reason for Visit: Follow up History from: Patient, husband  Primary Neurologist: [name]  ASSESSMENT AND PLAN 72 y.o. year old female   1.  Essential tremor 2.  Anxiety, depression  -Increase primidone as recommended by Dr. Anne Booker, working up to 100 mg in the morning, continue 250 mg in the evening (currently taking 250 mg at bedtime only) -She is definitely not interested in DBS -Strong maternal family history of essential tremor -Check primidone level for baseline -Also on Xanax, labetalol, gabapentin which could benefit tremor -I will have her follow-up in 6 months with Dr. Vickey Booker to establish care since Dr. Anne Booker has retired  Meds ordered this encounter  Medications   primidone (MYSOLINE) 250 MG tablet    Sig: Take 1 tablet (250 mg total) by mouth at bedtime.    Dispense:  30 tablet    Refill:  5   primidone (MYSOLINE) 50 MG tablet    Sig: Start taking 1 tablet in the morning x 1 month, then take 2 tablets in the morning    Dispense:  60 tablet    Refill:  5     HISTORY OF PRESENT ILLNESS: Today 06/18/22 Has not been seen since September 2022 by Dr. Anne Booker.  At that time primidone was increased to 100 mg in the morning, 250 mg in the evening.  Was also taking Xanax, gabapentin, labetalol for other reasons but also benefit tremor.  Mentioned may be considered candidate for DBS but does have history of anxiety depression.  Has history of extensive essential tremor on her maternal side. She has been taking 250 mg primidone at bedtime, was not taking the 100 mg in the morning. Takes Xanax 4 times daily for anxiety. Also on labetalol, gabapentin for other reasons that may help the tremor. Tremor is in both hands, equally. Worse when she comes to the doctor. She is not interested in DBS. Her husband drives her.  Her son tragically passed away 10 years ago when he was 18 for unknown reason.   HISTORY  10/10/20 Dr.  Anne Booker: [name] is a 72 year old right-handed white female with a history of an essential tremor and obesity and diabetes.  The patient will have good and bad days with the tremor, when she gets upset or anxious or nervous about something she will have heightened tremor.  She is on primidone taking 100 mg in the morning and 150 mg in the evening.  She has significant limitations in performing activities of daily living because of the tremor.  She is not able to cook.  She has difficulty with handwriting and with feeding herself.  She takes alprazolam 1 mg 4 times daily for anxiety, she is on gabapentin and she is on labetalol as well, and still has severe tremor.  She has an extensive family history on her mother's side with the essential tremor.  The tremor also affects the head and neck.  The patient comes to the office today for an evaluation.   REVIEW OF SYSTEMS: Out of a complete 14 system review of symptoms, the patient complains only of the following symptoms, and all other reviewed systems are negative.  See HPI  ALLERGIES: Allergies  Allergen Reactions   Codeine    Demerol    Hydrochlorothiazide     Nauseated, tremors get worse   Meperidine Hcl    Niacin-Lovastatin Er     "makes me feel not real'   Topiramate  HOME MEDICATIONS: Outpatient Medications Prior to Visit  Medication Sig Dispense Refill   ALPRAZolam (XANAX) 1 MG tablet Take 1 mg by mouth 4 (four) times daily.      amLODipine (NORVASC) 5 MG tablet Take 5 mg by mouth daily.   0   BD ULTRA-FINE PEN NEEDLES 29G X 12.7MM MISC USE FIVE TIMES DAILY FOR INSULIN INJECTIONS 400 each 2   Blood Glucose Monitoring Suppl (ONE TOUCH ULTRA SYSTEM KIT) W/DEVICE KIT Use as instructed to check blood sugars four times a day ( Dx Code 250.020) 1 each 0   Calcium Carbonate-Vitamin D 600-400 MG-UNIT tablet Take 1 tablet by mouth daily.       dapagliflozin propanediol (FARXIGA) 5 MG TABS tablet Take by mouth daily.     Dulaglutide  (TRULICITY) 3 MG/0.5ML SOPN Inject into the skin.     estradiol (VIVELLE-DOT) 0.1 MG/24HR Place 1 patch onto the skin 2 (two) times a week.     ferrous sulfate 325 (65 FE) MG tablet Take 325 mg by mouth daily.       furosemide (LASIX) 20 MG tablet take 1 tablet by mouth twice a day 30 tablet 2   gabapentin (NEURONTIN) 300 MG capsule Take 300 mg by mouth 3 (three) times daily.      Insulin Aspart (NOVOLOG FLEXPEN ) Inject into the skin. 60 units before breakfast, 60 units before Dinner     labetalol (NORMODYNE) 200 MG tablet Take 400 mg by mouth 2 (two) times daily.   0   LEVEMIR FLEXTOUCH 100 UNIT/ML Pen   0   mirtazapine (REMERON) 15 MG tablet Take 15 mg by mouth at bedtime.      Omega-3 Fatty Acids (FISH OIL) 1000 MG CAPS Take 1,200 mg by mouth. Take 4 capsules daily.     ONE TOUCH ULTRA TEST test strip USE 4 TIMES DAILY TO CHECK BLOOD SUGARS 300 each 3   PARoxetine (PAXIL) 30 MG tablet Take 30 mg by mouth 2 (two) times daily.     QUEtiapine (SEROQUEL) 400 MG tablet One half tablet in the morning and one tablet in the evening     rosuvastatin (CRESTOR) 40 MG tablet Take 40 mg by mouth daily.     valsartan (DIOVAN) 320 MG tablet Take 320 mg by mouth daily.   0   primidone (MYSOLINE) 50 MG tablet Take 2 tablets every morning 60 tablet 0   primidone (MYSOLINE) 250 MG tablet Take 1 tablet (250 mg total) by mouth at bedtime. (Patient not taking: Reported on 06/18/2022) 30 tablet 0   No facility-administered medications prior to visit.    PAST MEDICAL HISTORY: Past Medical History:  Diagnosis Date   Allergic rhinitis    Anxiety    Benign essential tremor    CRI (chronic renal insufficiency)    Depression    Diabetes mellitus, type 2 (HCC)    Diverticulosis of colon    Essential and other specified forms of tremor 12/29/2012   GERD (gastroesophageal reflux disease)    Hyperlipemia    IBS (irritable bowel syndrome)    Iron deficiency anemia    Morbidly obese (HCC)    Pernicious anemia      PAST SURGICAL HISTORY: Past Surgical History:  Procedure Laterality Date   ABDOMINAL HYSTERECTOMY     BILATERAL OOPHORECTOMY     ovarian cysts   BREAST LUMPECTOMY Right 11/2016   CESAREAN SECTION     polyps removed     colonoscopy   TONSILLECTOMY  FAMILY HISTORY: Family History  Problem Relation Age of Onset   Heart disease Mother    Diabetes Father    Hypertension Father    Heart disease Sister    Stroke Sister    Diabetes Sister    Heart disease Unknown    Diabetes Unknown    Colitis Unknown        ulcerative    SOCIAL HISTORY: Social History   Socioeconomic History   Marital status: Married    Spouse name: Raeford    Number of children: 2   Years of education: 14   Highest education level: Not on file  Occupational History    Employer: UNEMPLOYED  Tobacco Use   Smoking status: Never   Smokeless tobacco: Never  Substance and Sexual Activity   Alcohol use: No   Drug use: Not on file   Sexual activity: Not on file  Other Topics Concern   Not on file  Social History Narrative   Patient lives at home with husband Raeford.   Patient has 2 children. One past away last year.    Patient is currently not working.    Patient has 2 years of college.    Patient is right handed.             Social Determinants of Health   Financial Resource Strain: Not on file  Food Insecurity: Not on file  Transportation Needs: Not on file  Physical Activity: Not on file  Stress: Not on file  Social Connections: Not on file  Intimate Partner Violence: Not on file   PHYSICAL EXAM  Vitals:   06/18/22 1358 06/18/22 1400  BP: (!) 199/81 (!) 156/82  Pulse: 80 75  Weight: 247 lb (112 kg)   Height: 5\' 4"  (1.626 m)    Body mass index is 42.4 kg/m.  Generalized: Well developed, in no acute distress, very well put together, appears slightly anxious Neurological examination  Mentation: Alert oriented to time, place, history taking. Follows all commands speech and  language fluent Cranial nerve II-XII: Pupils were equal round reactive to light. Extraocular movements were full, visual field were full on confrontational test. Facial sensation and strength were normal. Head turning and shoulder shrug  were normal and symmetric.  Mild head tremor noted side-to-side. Motor: The motor testing reveals 5 over 5 strength of all 4 extremities. Good symmetric motor tone is noted throughout.  No significant muscle rigidity or bradykinesia noted. Sensory: Sensory testing is intact to soft touch on all 4 extremities. No evidence of extinction is noted.  Coordination: Cerebellar testing reveals good finger-nose-finger and heel-to-shin bilaterally.  Moderate tremor with finger-nose-finger greater on the left than the right.  With handwriting sample significant tremor is translated into spiral drawl in name writing. Gait and station: Gait is normal but wide-based due to body habitus Reflexes: Deep tendon reflexes are symmetric but decreased throughout  DIAGNOSTIC DATA (LABS, IMAGING, TESTING) - I reviewed patient records, labs, notes, testing and imaging myself where available.  Lab Results  Component Value Date   WBC 13.5 (H) 06/29/2013   HGB 11.6 06/29/2013   HCT 35.5 06/29/2013   MCV 92 06/29/2013   PLT 265 12/29/2012      Component Value Date/Time   NA 140 06/29/2013 1549   K 4.5 06/29/2013 1549   CL 97 06/29/2013 1549   CO2 24 06/29/2013 1549   GLUCOSE 129 (H) 06/29/2013 1549   GLUCOSE 152 (H) 03/26/2012 1035   GLUCOSE 168 (H) 01/31/2006 9562  BUN 26 06/29/2013 1549   CREATININE 1.82 (H) 06/29/2013 1549   CREATININE 1.62 (H) 07/05/2011 1646   CALCIUM 10.1 06/29/2013 1549   CALCIUM 9.0 05/22/2007 0215   PROT 6.4 06/29/2013 1549   ALBUMIN 4.2 06/29/2013 1549   AST 17 06/29/2013 1549   ALT 28 06/29/2013 1549   ALKPHOS 97 06/29/2013 1549   BILITOT 0.3 06/29/2013 1549   GFRNONAA 30 (L) 06/29/2013 1549   GFRNONAA 36 (L) 05/18/2010 1104   GFRAA 34 (L)  06/29/2013 1549   GFRAA 43 (L) 05/18/2010 1104   Lab Results  Component Value Date   CHOL 167 09/25/2011   HDL 44.60 09/25/2011   LDLCALC 104 (H) 01/02/2010   LDLDIRECT 83.6 09/25/2011   TRIG 257.0 (H) 09/25/2011   CHOLHDL 4 09/25/2011   Lab Results  Component Value Date   HGBA1C 6.8 (H) 03/26/2012   Lab Results  Component Value Date   VITAMINB12 487 09/04/2010   Lab Results  Component Value Date   TSH 2.08 04/30/2011    Margie Ege, AGNP-C, DNP 06/18/2022, 2:46 PM [facility] [address], [address] [place], [place] [address] [phone]

## 2022-06-19 LAB — PRIMIDONE, SERUM
Phenobarbital, Serum: 6 ug/mL — ABNORMAL LOW (ref 15–40)
Primidone Lvl: 5.3 ug/mL (ref 5.0–12.0)

## 2022-12-15 ENCOUNTER — Other Ambulatory Visit: Payer: Self-pay | Admitting: Neurology

## 2022-12-23 ENCOUNTER — Ambulatory Visit: Payer: PPO | Admitting: Neurology

## 2023-01-16 ENCOUNTER — Other Ambulatory Visit: Payer: Self-pay | Admitting: Neurology

## 2023-06-18 NOTE — Telephone Encounter (Signed)
 This encounter was created in error - please disregard.

## 2023-06-23 ENCOUNTER — Telehealth: Payer: Self-pay | Admitting: Neurology

## 2023-06-23 MED ORDER — PRIMIDONE 250 MG PO TABS: 250.0000 mg | ORAL_TABLET | Freq: Every day | ORAL | 5 refills | Status: DC

## 2023-06-23 MED ORDER — PRIMIDONE 50 MG PO TABS: ORAL_TABLET | ORAL | 5 refills | Status: DC

## 2023-06-23 NOTE — Telephone Encounter (Signed)
 Pt has scheduled her needed f/u(afternoon appointment with wait list) she is requesting a refill on her primidone  (MYSOLINE ) 250 MG tablet  & primidone  (MYSOLINE ) 50 MG tablet  to  [facility] [phone]

## 2023-06-23 NOTE — Telephone Encounter (Signed)
 refilled

## 2023-06-24 MED ORDER — PRIMIDONE 50 MG PO TABS: ORAL_TABLET | ORAL | 5 refills | Status: DC

## 2023-06-24 MED ORDER — PRIMIDONE 250 MG PO TABS: 250.0000 mg | ORAL_TABLET | Freq: Every day | ORAL | 5 refills | Status: DC

## 2023-06-24 NOTE — Telephone Encounter (Signed)
 Pt called refill was sent to the wrong pharmacy. Please resend primidone  (MYSOLINE ) 250 MG tablet and primidone  (MYSOLINE ) 50 MG tablet  to [facility] [phone] in [place]

## 2023-06-24 NOTE — Telephone Encounter (Signed)
 Refilled to [facility] [place]

## 2023-06-24 NOTE — Addendum Note (Signed)
 Addended by: Randi Buster on: 06/24/2023 02:38 PM   Modules accepted: Orders

## 2023-12-07 ENCOUNTER — Other Ambulatory Visit: Payer: Self-pay | Admitting: Neurology

## 2024-01-02 ENCOUNTER — Other Ambulatory Visit: Payer: Self-pay | Admitting: Neurology

## 2024-01-07 NOTE — Telephone Encounter (Signed)
 Last seen on 06/18/22 Follow up scheduled on 01/20/24

## 2024-01-19 ENCOUNTER — Telehealth: Payer: Self-pay | Admitting: Neurology

## 2024-01-19 NOTE — Telephone Encounter (Signed)
 Pt called and LVM. Stated she will not be able to come to her 12/30 appt with NP but to go ahead and r/s the appt to the NP's next latest appt because she will not be redially available to take the call. Pt has been scheduled.

## 2024-01-20 ENCOUNTER — Ambulatory Visit: Admitting: Neurology

## 2024-01-27 ENCOUNTER — Other Ambulatory Visit: Payer: Self-pay | Admitting: *Deleted

## 2024-01-27 MED ORDER — PRIMIDONE 50 MG PO TABS: ORAL_TABLET | ORAL | 5 refills | Status: AC

## 2024-01-27 NOTE — Telephone Encounter (Signed)
 Last seen on 06/18/22 Follow up scheduled on 08/19/24

## 2024-02-12 ENCOUNTER — Other Ambulatory Visit: Payer: Self-pay | Admitting: Neurology

## 2024-08-19 ENCOUNTER — Ambulatory Visit: Admitting: Neurology
